# Patient Record
Sex: Male | Born: 1950
Health system: Southern US, Community
[De-identification: ages and names within clinical notes are randomized; demographics above are authoritative.]

## PROBLEM LIST (undated history)

## (undated) ENCOUNTER — Emergency Department (HOSPITAL_COMMUNITY): Admission: EM | Payer: Self-pay | Source: Home / Self Care

## (undated) DIAGNOSIS — M2550 Pain in unspecified joint: Secondary | ICD-10-CM

## (undated) DIAGNOSIS — E8881 Metabolic syndrome: Secondary | ICD-10-CM

## (undated) DIAGNOSIS — G4700 Insomnia, unspecified: Secondary | ICD-10-CM

## (undated) DIAGNOSIS — F329 Major depressive disorder, single episode, unspecified: Secondary | ICD-10-CM

## (undated) DIAGNOSIS — G8929 Other chronic pain: Secondary | ICD-10-CM

## (undated) DIAGNOSIS — F32A Depression, unspecified: Secondary | ICD-10-CM

## (undated) DIAGNOSIS — S83209A Unspecified tear of unspecified meniscus, current injury, unspecified knee, initial encounter: Secondary | ICD-10-CM

## (undated) DIAGNOSIS — E669 Obesity, unspecified: Secondary | ICD-10-CM

## (undated) DIAGNOSIS — R5383 Other fatigue: Secondary | ICD-10-CM

## (undated) DIAGNOSIS — M009 Pyogenic arthritis, unspecified: Secondary | ICD-10-CM

## (undated) DIAGNOSIS — M549 Dorsalgia, unspecified: Secondary | ICD-10-CM

## (undated) DIAGNOSIS — I10 Essential (primary) hypertension: Secondary | ICD-10-CM

## (undated) DIAGNOSIS — R42 Dizziness and giddiness: Secondary | ICD-10-CM

## (undated) DIAGNOSIS — E785 Hyperlipidemia, unspecified: Secondary | ICD-10-CM

## (undated) HISTORY — PX: HERNIA REPAIR: SHX51

## (undated) HISTORY — DX: Essential (primary) hypertension: I10

## (undated) HISTORY — DX: Major depressive disorder, single episode, unspecified: F32.9

## (undated) HISTORY — DX: Insomnia, unspecified: G47.00

## (undated) HISTORY — DX: Metabolic syndrome: E88.81

## (undated) HISTORY — DX: Hyperlipidemia, unspecified: E78.5

## (undated) HISTORY — DX: Dizziness and giddiness: R42

## (undated) HISTORY — DX: Other chronic pain: G89.29

## (undated) HISTORY — DX: Other fatigue: R53.83

## (undated) HISTORY — DX: Depression, unspecified: F32.A

## (undated) HISTORY — DX: Dorsalgia, unspecified: M54.9

## (undated) HISTORY — DX: Pain in unspecified joint: M25.50

## (undated) HISTORY — PX: INGUINAL HERNIA REPAIR: SUR1180

## (undated) HISTORY — DX: Obesity, unspecified: E66.9

## (undated) HISTORY — DX: Metabolic syndrome: E88.810

---
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
## 1990-07-20 HISTORY — PX: BACK SURGERY: SHX140

## 1991-07-21 HISTORY — PX: OTHER SURGICAL HISTORY: SHX169

## 2001-03-16 ENCOUNTER — Encounter: Admission: RE | Admit: 2001-03-16 | Discharge: 2001-03-19 | Payer: Self-pay | Admitting: Anesthesiology

## 2001-08-05 ENCOUNTER — Ambulatory Visit (HOSPITAL_COMMUNITY): Admission: RE | Admit: 2001-08-05 | Discharge: 2001-08-05 | Payer: Self-pay | Admitting: Family Medicine

## 2001-08-05 ENCOUNTER — Encounter: Payer: Self-pay | Admitting: Family Medicine

## 2001-11-28 ENCOUNTER — Encounter: Payer: Self-pay | Admitting: Family Medicine

## 2001-11-28 ENCOUNTER — Ambulatory Visit (HOSPITAL_COMMUNITY): Admission: RE | Admit: 2001-11-28 | Discharge: 2001-11-28 | Payer: Self-pay | Admitting: Family Medicine

## 2002-05-29 ENCOUNTER — Ambulatory Visit (HOSPITAL_COMMUNITY): Admission: RE | Admit: 2002-05-29 | Discharge: 2002-05-29 | Payer: Self-pay | Admitting: Internal Medicine

## 2006-07-20 HISTORY — PX: OTHER SURGICAL HISTORY: SHX169

## 2009-08-20 LAB — HM PAP SMEAR

## 2009-08-20 LAB — HM MAMMOGRAPHY

## 2010-11-20 ENCOUNTER — Encounter: Payer: Self-pay | Admitting: Family Medicine

## 2012-08-24 ENCOUNTER — Ambulatory Visit: Payer: Medicare Other | Admitting: Cardiology

## 2012-10-03 ENCOUNTER — Other Ambulatory Visit: Payer: Self-pay | Admitting: *Deleted

## 2012-10-03 DIAGNOSIS — M810 Age-related osteoporosis without current pathological fracture: Secondary | ICD-10-CM

## 2012-10-05 ENCOUNTER — Other Ambulatory Visit: Payer: Self-pay | Admitting: Nurse Practitioner

## 2012-10-18 ENCOUNTER — Other Ambulatory Visit: Payer: Self-pay

## 2012-10-18 DIAGNOSIS — Z1212 Encounter for screening for malignant neoplasm of rectum: Secondary | ICD-10-CM

## 2012-12-28 ENCOUNTER — Ambulatory Visit (INDEPENDENT_AMBULATORY_CARE_PROVIDER_SITE_OTHER): Payer: Medicare Other | Admitting: Family Medicine

## 2012-12-28 ENCOUNTER — Encounter: Payer: Self-pay | Admitting: Family Medicine

## 2012-12-28 VITALS — BP 142/87 | HR 65 | Temp 97.4°F | Ht 70.0 in | Wt 321.4 lb

## 2012-12-28 DIAGNOSIS — M62838 Other muscle spasm: Secondary | ICD-10-CM

## 2012-12-28 DIAGNOSIS — R718 Other abnormality of red blood cells: Secondary | ICD-10-CM

## 2012-12-28 DIAGNOSIS — E559 Vitamin D deficiency, unspecified: Secondary | ICD-10-CM

## 2012-12-28 DIAGNOSIS — E785 Hyperlipidemia, unspecified: Secondary | ICD-10-CM | POA: Insufficient documentation

## 2012-12-28 DIAGNOSIS — I10 Essential (primary) hypertension: Secondary | ICD-10-CM

## 2012-12-28 DIAGNOSIS — E119 Type 2 diabetes mellitus without complications: Secondary | ICD-10-CM

## 2012-12-28 DIAGNOSIS — D751 Secondary polycythemia: Secondary | ICD-10-CM

## 2012-12-28 LAB — BASIC METABOLIC PANEL WITH GFR
Calcium: 9.1 mg/dL (ref 8.4–10.5)
Chloride: 105 mEq/L (ref 96–112)
Creat: 1.1 mg/dL (ref 0.50–1.35)
GFR, Est Non African American: 71 mL/min

## 2012-12-28 LAB — POCT CBC
Granulocyte percent: 69.5 %G (ref 37–80)
HCT, POC: 46.9 % (ref 43.5–53.7)
Hemoglobin: 16.3 g/dL (ref 14.1–18.1)
MCV: 88 fL (ref 80–97)
POC Granulocyte: 4.6 (ref 2–6.9)
RBC: 5.3 M/uL (ref 4.69–6.13)
RDW, POC: 135 %

## 2012-12-28 LAB — HEPATIC FUNCTION PANEL
Albumin: 4.8 g/dL (ref 3.5–5.2)
Alkaline Phosphatase: 54 U/L (ref 39–117)
Total Bilirubin: 0.8 mg/dL (ref 0.3–1.2)

## 2012-12-28 LAB — POCT GLYCOSYLATED HEMOGLOBIN (HGB A1C): Hemoglobin A1C: 5.4

## 2012-12-28 MED ORDER — RAMIPRIL 10 MG PO CAPS: 10.0000 mg | ORAL_CAPSULE | Freq: Every day | ORAL | Status: DC

## 2012-12-28 MED ORDER — CYCLOBENZAPRINE HCL 10 MG PO TABS: ORAL_TABLET | ORAL | Status: DC

## 2012-12-28 NOTE — Patient Instructions (Addendum)
Continue current meds and therapeutic lifestyle changes Always be careful and do not put yourself at risk for falling. If left hip pain continues please come back and get x-ray of left hip

## 2012-12-28 NOTE — Progress Notes (Signed)
  Subjective:    Patient ID: Seth Martinez, male    DOB: [birth year], 62 y.o.   MRN: [id number]  HPI Patient returns to clinic today for followup of chronic medical problems. These include hypertension hyperlipidemia chronic back pain and an inhalant allergies. He has a long list of allergies to many medications. He also refuses to take the flu vaccine and the shingles shot. He is past due on his colonoscopy.    Review of Systems  Constitutional: Positive for fatigue.  HENT: Positive for sore throat (due to drainage) and postnasal drip. Negative for ear pain.   Eyes: Positive for redness and itching (due to allergies). Negative for pain.  Respiratory: Negative.   Cardiovascular: Negative.   Gastrointestinal: Negative.   Endocrine: Negative.   Genitourinary: Negative.   Musculoskeletal: Positive for back pain (LBP) and arthralgias (L hip, bilateral ankles).  Skin: Negative.   Allergic/Immunologic: Positive for environmental allergies (seasonal).  Neurological: Negative.   Hematological: Negative.   Psychiatric/Behavioral: Negative.        Objective:   Physical Exam BP 142/87  Pulse 65  Temp(Src) 97.4 F (36.3 C) (Oral)  Ht 5\' 10"  (1.778 m)  Wt 321 lb 6.4 oz (145.786 kg)  BMI 46.12 kg/m2  The patient appeared well nourished and normally developed other than being overweight. He is alert and oriented to time and place. Speech, behavior and judgement appear normal. Vital signs as documented.  Head exam is unremarkable. No scleral icterus or pallor noted. Some nasal congestion bilaterally. Ear canals were normal your Neck is without jugular venous distension, thyromegally, or carotid bruits. Carotid upstrokes are brisk bilaterally. No cervical adenopathy. Lungs are clear anteriorly and posteriorly to auscultation. Normal respiratory effort. Cardiac exam reveals regular rate and rhythm at 60 per. First and second heart sounds normal.  No murmurs, rubs or gallops.  Abdominal  exam reveals normal bowl sounds, no masses, no organomegaly and no aortic enlargement. No inguinal adenopathy. There is still severe obesity. Extremities are nonedematous and both femoral and pedal pulses are normal. The right lateral malleolus was tender to palpation. There was no rubor or erythema. Skin without pallor or jaundice.  Warm and dry, without rash. Neurologic exam reveals normal deep tendon reflexes and normal sensation. Diabetic foot exam was done.          Assessment & Plan:  1. Diabetes mellitus, type 2 - POCT glycosylated hemoglobin (Hb A1C); Standing - POCT UA - Microalbumin; Standing - BASIC METABOLIC PANEL WITH GFR; Standing - POCT glycosylated hemoglobin (Hb A1C) - POCT UA - Microalbumin - BASIC METABOLIC PANEL WITH GFR  2. Hyperlipemia - POCT CBC; Standing - NMR Lipoprofile with Lipids; Standing - Hepatic function panel; Standing - POCT CBC - NMR Lipoprofile with Lipids - Hepatic function panel  3. Hypertension - BASIC METABOLIC PANEL WITH GFR; Standing - BASIC METABOLIC PANEL WITH GFR  4. Vitamin D deficiency - Vitamin D 25 hydroxy; Standing - Vitamin D 25 hydroxy  5. Elevated red blood cell count - POCT CBC; Standing - POCT CBC  Patient Instructions  Continue current meds and therapeutic lifestyle changes Always be careful and do not put yourself at risk for falling. If left hip pain continues please come back and get x-ray of left hip

## 2012-12-29 LAB — NMR LIPOPROFILE WITH LIPIDS
HDL Particle Number: 36.3 umol/L (ref >=30.5)
HDL-C: 42 mg/dL (ref >=40)
LDL (calc): 75 mg/dL (ref <100)
LDL Size: 20.2 nm — ABNORMAL LOW (ref >20.5)
LP-IR Score: 72 — ABNORMAL HIGH (ref <=45)
Triglycerides: 146 mg/dL (ref <150)

## 2012-12-29 LAB — VITAMIN D 25 HYDROXY (VIT D DEFICIENCY, FRACTURES): Vit D, 25-Hydroxy: 46 ng/mL (ref 30–89)

## 2013-04-19 DIAGNOSIS — M009 Pyogenic arthritis, unspecified: Secondary | ICD-10-CM

## 2013-04-19 HISTORY — DX: Pyogenic arthritis, unspecified: M00.9

## 2013-04-30 ENCOUNTER — Emergency Department (HOSPITAL_COMMUNITY)
Admission: EM | Admit: 2013-04-30 | Discharge: 2013-04-30 | Disposition: A | Discharge status: Home / Self Care | Payer: Medicare Other | Attending: Emergency Medicine | Admitting: Emergency Medicine

## 2013-04-30 ENCOUNTER — Emergency Department (HOSPITAL_COMMUNITY): Payer: Medicare Other

## 2013-04-30 ENCOUNTER — Encounter (HOSPITAL_COMMUNITY): Payer: Self-pay | Admitting: Emergency Medicine

## 2013-04-30 DIAGNOSIS — M5431 Sciatica, right side: Secondary | ICD-10-CM

## 2013-04-30 DIAGNOSIS — M5430 Sciatica, unspecified side: Secondary | ICD-10-CM | POA: Insufficient documentation

## 2013-04-30 DIAGNOSIS — M79609 Pain in unspecified limb: Secondary | ICD-10-CM | POA: Insufficient documentation

## 2013-04-30 DIAGNOSIS — Z79899 Other long term (current) drug therapy: Secondary | ICD-10-CM | POA: Insufficient documentation

## 2013-04-30 DIAGNOSIS — M25559 Pain in unspecified hip: Secondary | ICD-10-CM | POA: Insufficient documentation

## 2013-04-30 DIAGNOSIS — I10 Essential (primary) hypertension: Secondary | ICD-10-CM | POA: Insufficient documentation

## 2013-04-30 DIAGNOSIS — J069 Acute upper respiratory infection, unspecified: Secondary | ICD-10-CM | POA: Insufficient documentation

## 2013-04-30 DIAGNOSIS — E669 Obesity, unspecified: Secondary | ICD-10-CM | POA: Insufficient documentation

## 2013-04-30 DIAGNOSIS — Z880 Allergy status to penicillin: Secondary | ICD-10-CM | POA: Insufficient documentation

## 2013-04-30 LAB — COMPREHENSIVE METABOLIC PANEL WITH GFR
ALT: 27 U/L (ref 0–53)
AST: 40 U/L — ABNORMAL HIGH (ref 0–37)
Albumin: 3.2 g/dL — ABNORMAL LOW (ref 3.5–5.2)
Alkaline Phosphatase: 84 U/L (ref 39–117)
BUN: 16 mg/dL (ref 6–23)
CO2: 21 meq/L (ref 19–32)
Calcium: 8.9 mg/dL (ref 8.4–10.5)
Chloride: 96 meq/L (ref 96–112)
Creatinine, Ser: 1.37 mg/dL — ABNORMAL HIGH (ref 0.50–1.35)
GFR calc Af Amer: 62 mL/min — ABNORMAL LOW
GFR calc non Af Amer: 53 mL/min — ABNORMAL LOW
Glucose, Bld: 137 mg/dL — ABNORMAL HIGH (ref 70–99)
Potassium: 4 meq/L (ref 3.5–5.1)
Sodium: 134 meq/L — ABNORMAL LOW (ref 135–145)
Total Bilirubin: 1.1 mg/dL (ref 0.3–1.2)
Total Protein: 7.3 g/dL (ref 6.0–8.3)

## 2013-04-30 LAB — CBC WITH DIFFERENTIAL/PLATELET
Eosinophils Absolute: 0 10*3/uL (ref 0.0–0.7)
Eosinophils Relative: 0 % (ref 0–5)
HCT: 42.5 % (ref 39.0–52.0)
Lymphs Abs: 0.7 10*3/uL (ref 0.7–4.0)
MCHC: 35.1 g/dL (ref 30.0–36.0)
MCV: 86.4 fL (ref 78.0–100.0)
Monocytes Relative: 11 % (ref 3–12)
Neutrophils Relative %: 85 % — ABNORMAL HIGH (ref 43–77)
RBC: 4.92 MIL/uL (ref 4.22–5.81)
WBC: 15.4 10*3/uL — ABNORMAL HIGH (ref 4.0–10.5)

## 2013-04-30 LAB — URINALYSIS, ROUTINE W REFLEX MICROSCOPIC
Glucose, UA: NEGATIVE mg/dL
Ketones, ur: 15 mg/dL — AB
Leukocytes, UA: NEGATIVE
Nitrite: NEGATIVE
Protein, ur: 30 mg/dL — AB
Specific Gravity, Urine: 1.023 (ref 1.005–1.030)
Urobilinogen, UA: 1 mg/dL (ref 0.0–1.0)
pH: 5.5 (ref 5.0–8.0)

## 2013-04-30 LAB — CG4 I-STAT (LACTIC ACID)
Lactic Acid, Venous: 2.59 mmol/L — ABNORMAL HIGH (ref 0.5–2.2)
Lactic Acid, Venous: 2.04 mmol/L (ref 0.5–2.2)

## 2013-04-30 LAB — URINE MICROSCOPIC-ADD ON

## 2013-04-30 LAB — SEDIMENTATION RATE: Sed Rate: 60 mm/h — ABNORMAL HIGH (ref 0–16)

## 2013-04-30 MED ORDER — OXYCODONE-ACETAMINOPHEN 5-325 MG PO TABS
1.0000 | ORAL_TABLET | Freq: Once | ORAL | Status: AC
  Administered 2013-04-30: 1 via ORAL
  Filled 2013-04-30: qty 1

## 2013-04-30 MED ORDER — ACETAMINOPHEN 325 MG PO TABS
650.0000 mg | ORAL_TABLET | Freq: Once | ORAL | Status: AC
  Administered 2013-04-30: 650 mg via ORAL
  Filled 2013-04-30: qty 2

## 2013-04-30 MED ORDER — FENTANYL CITRATE 0.05 MG/ML IJ SOLN
100.0000 ug | Freq: Once | INTRAMUSCULAR | Status: AC
  Administered 2013-04-30: 100 ug via INTRAVENOUS
  Filled 2013-04-30: qty 2

## 2013-04-30 MED ORDER — CYCLOBENZAPRINE HCL 10 MG PO TABS: 10.0000 mg | ORAL_TABLET | Freq: Two times a day (BID) | ORAL | Status: DC | PRN

## 2013-04-30 MED ORDER — SODIUM CHLORIDE 0.9 % IV BOLUS (SEPSIS)
1000.0000 mL | Freq: Once | INTRAVENOUS | Status: AC
  Administered 2013-04-30: 1000 mL via INTRAVENOUS

## 2013-04-30 MED ORDER — OXYCODONE-ACETAMINOPHEN 5-325 MG PO TABS: 1.0000 | ORAL_TABLET | Freq: Four times a day (QID) | ORAL | Status: DC | PRN

## 2013-04-30 MED ORDER — METHOCARBAMOL 100 MG/ML IJ SOLN
1000.0000 mg | Freq: Once | INTRAMUSCULAR | Status: AC
  Administered 2013-04-30: 1000 mg via INTRAMUSCULAR
  Filled 2013-04-30: qty 10

## 2013-04-30 NOTE — ED Notes (Signed)
MD at bedside. 

## 2013-04-30 NOTE — ED Provider Notes (Addendum)
Medical screening examination/treatment/procedure(s) were conducted as a shared visit with resident physician and myself.  I personally evaluated the patient during the encounter. I personally reviewed and interpreted the ecg and agree with the residents interpretation.    I interviewed and examined the patient. Lungs are CTAB. Cardiac exam wnl. Abdomen soft.  Positive straight leg raise in RLE. Pt has had similar pain in right hip previously. He otherwise has normal rom of RLE. I suspect his pain is d/t sciatica. His fever is likely from viral URI. I doubt that he has a septic joint at this time, but will rec close f/u w/ pcp. His mildly elev lactate has dec w/ IVF rehydration.   Junius Argyle, MD 04/30/13 1478  Junius Argyle, MD 05/12/13 1118

## 2013-04-30 NOTE — ED Notes (Signed)
Patient stated pain left knee has full range of motion however states pain developed after receiving the injection on his buttocks.  EDP at bedside.  Patient also explain pain right index finger achy soreness. Able to bend finger. Cap refill less then 3 seconds.

## 2013-04-30 NOTE — ED Provider Notes (Signed)
CSN: [id number]     Arrival date & time 04/30/13  1339 History   First MD Initiated Contact with Patient 04/30/13 1454     Chief Complaint  Patient presents with  . Back Pain  . Cough   (Consider location/radiation/quality/duration/timing/severity/associated sxs/prior Treatment) Patient is a 62 y.o. male presenting with leg pain.  Leg Pain Location:  Hip and leg Injury: no   Hip location:  R hip Leg location:  R leg and R upper leg Pain details:    Quality:  Burning   Radiates to:  Groin   Severity:  Severe   Onset quality:  Gradual   Timing:  Constant   Progression:  Worsening Chronicity:  Recurrent Dislocation: no   Foreign body present:  No foreign bodies Prior injury to area:  No Relieved by:  Nothing Worsened by:  Bearing weight, activity and flexion Ineffective treatments: flexeril. Associated symptoms: fever, swelling and tingling   Associated symptoms: no back pain, no decreased ROM, no itching, no muscle weakness, no neck pain, no numbness and no stiffness     Past Medical History  Diagnosis Date  . Obesity   . Hypertension    Past Surgical History  Procedure Laterality Date  . Back surgery    . Hernia repair     History reviewed. No pertinent family history. History  Substance Use Topics  . Smoking status: Never Smoker   . Smokeless tobacco: Not on file  . Alcohol Use: No    Review of Systems  Constitutional: Positive for fever. Negative for activity change and appetite change.  Respiratory: Negative for apnea, cough, choking and shortness of breath.   Cardiovascular: Negative for chest pain.  Gastrointestinal: Negative for nausea, vomiting, diarrhea and constipation.  Genitourinary: Negative for dysuria and difficulty urinating.  Musculoskeletal: Positive for arthralgias and gait problem. Negative for back pain, joint swelling, neck pain, neck stiffness and stiffness.  Skin: Negative for color change, itching, pallor, rash and wound.   Neurological: Negative for weakness, light-headedness and numbness.  All other systems reviewed and are negative.    Allergies  Amitriptyline; Bextra; Cefdinir; Celebrex; Ibuprofen; Lexapro; Morphine and related; Neurontin; Penicillins; Propoxyphene and methadone; Skelaxin; Ultram; and Vioxx  Home Medications   Current Outpatient Rx  Name  Route  Sig  Dispense  Refill  . cyclobenzaprine (FLEXERIL) 10 MG tablet   Oral   Take 10 mg by mouth daily as needed for muscle spasms.         Marland Kitchen dextromethorphan (DELSYM) 30 MG/5ML liquid   Oral   Take 60 mg by mouth every 12 (twelve) hours as needed for cough.         . Pseudoeph-Doxylamine-DM-APAP (NYQUIL PO)   Oral   Take 30 mLs by mouth at bedtime as needed (cold symptoms).         . Pseudoephedrine-APAP-DM (DAYQUIL PO)   Oral   Take 30 mLs by mouth every 6 (six) hours as needed (cold symptoms).         . cyclobenzaprine (FLEXERIL) 10 MG tablet   Oral   Take 1 tablet (10 mg total) by mouth 2 (two) times daily as needed for muscle spasms.   20 tablet   0   . oxyCODONE-acetaminophen (PERCOCET/ROXICET) 5-325 MG per tablet   Oral   Take 1 tablet by mouth every 6 (six) hours as needed for pain.   20 tablet   0    BP 127/84  Pulse 82  Temp(Src) 99.5 F (37.5 C) (Oral)  Resp 20  SpO2 97% Physical Exam  Nursing note and vitals reviewed. Constitutional: He is oriented to person, place, and time. He appears well-developed and well-nourished. No distress.  Obese, uncomfortable, moving around in bed to try and get comfortable  HENT:  Head: Normocephalic and atraumatic.  Mouth/Throat: Oropharynx is clear and moist. No oropharyngeal exudate.  Eyes: Conjunctivae and EOM are normal. Pupils are equal, round, and reactive to light.  Neck: Normal range of motion. Neck supple.  Cardiovascular: Normal rate, regular rhythm, normal heart sounds and intact distal pulses.  Exam reveals no gallop and no friction rub.   No murmur  heard. Pulmonary/Chest: Effort normal and breath sounds normal. No respiratory distress. He has no wheezes. He has no rales.  Abdominal: Soft. He exhibits no distension and no mass. There is no tenderness. There is no rebound and no guarding.  obese  Musculoskeletal: Normal range of motion. He exhibits tenderness (tenderness to anterior and posterior hip, worsening upper posterior thigh pain with ROM but full ROM. no warmth, erythema,.). He exhibits no edema.  Lymphadenopathy:    He has no cervical adenopathy.  Neurological: He is alert and oriented to person, place, and time. He has normal strength. No cranial nerve deficit or sensory deficit. GCS eye subscore is 4. GCS verbal subscore is 5. GCS motor subscore is 6.  Skin: Skin is warm and dry. No rash noted. He is not diaphoretic.  Psychiatric: He has a normal mood and affect. His behavior is normal. Judgment and thought content normal.    ED Course  Procedures (including critical care time) Labs Review Labs Reviewed  CBC WITH DIFFERENTIAL - Abnormal; Notable for the following:    WBC 15.4 (*)    Neutrophils Relative % 85 (*)    Neutro Abs 13.0 (*)    Lymphocytes Relative 4 (*)    Monocytes Absolute 1.6 (*)    All other components within normal limits  COMPREHENSIVE METABOLIC PANEL - Abnormal; Notable for the following:    Sodium 134 (*)    Glucose, Bld 137 (*)    Creatinine, Ser 1.37 (*)    Albumin 3.2 (*)    AST 40 (*)    GFR calc non Af Amer 53 (*)    GFR calc Af Amer 62 (*)    All other components within normal limits  URINALYSIS, ROUTINE W REFLEX MICROSCOPIC - Abnormal; Notable for the following:    Color, Urine AMBER (*)    APPearance CLOUDY (*)    Hgb urine dipstick TRACE (*)    Bilirubin Urine SMALL (*)    Ketones, ur 15 (*)    Protein, ur 30 (*)    All other components within normal limits  SEDIMENTATION RATE - Abnormal; Notable for the following:    Sed Rate 60 (*)    All other components within normal limits   URINE MICROSCOPIC-ADD ON - Abnormal; Notable for the following:    Casts HYALINE CASTS (*)    All other components within normal limits  CG4 I-STAT (LACTIC ACID) - Abnormal; Notable for the following:    Lactic Acid, Venous 2.59 (*)    All other components within normal limits  C-REACTIVE PROTEIN  CG4 I-STAT (LACTIC ACID)   Imaging Review Dg Chest 2 View  04/30/2013   CLINICAL DATA:  Back pain and cough.  EXAM: CHEST  2 VIEW  COMPARISON:  No priors.  FINDINGS: Lung volumes are normal. No consolidative airspace disease. No pleural effusions. No pneumothorax. No pulmonary nodule  or mass noted. Pulmonary vasculature and the cardiomediastinal silhouette are within normal limits. Old healed fracture of the distal right clavicle incidentally noted.  IMPRESSION: 1.  No radiographic evidence of acute cardiopulmonary disease.   Electronically Signed   By: Trudie Reed M.D.   On: 04/30/2013 15:22   Dg Lumbar Spine 2-3 Views  04/30/2013   CLINICAL DATA:  Back pain.  EXAM: LUMBAR SPINE - 2-3 VIEW  COMPARISON:  None.  FINDINGS: There is marked convex right scoliosis. Multilevel degenerative change is present. The patient is status post L4-S1 fusion. Hardware appears intact. No fracture is identified.  IMPRESSION: No acute finding.  Multilevel degenerative change. Status post L4-S1 fusion.   Electronically Signed   By: Drusilla Kanner M.D.   On: 04/30/2013 16:56   Dg Hip Complete Right  04/30/2013   CLINICAL DATA:  Low back pain and right-sided hip pain.  EXAM: RIGHT HIP - COMPLETE 2+ VIEW  COMPARISON:  No priors.  FINDINGS: AP view of the pelvis and AP and lateral views of the right hip demonstrate no definite acute displaced fracture, subluxation, dislocation, joint or soft tissue abnormality. Orthopedic fixation hardware is noted at the lumbosacral junction, and the fixation screw in the low was position on the left appears fractured. No prior studies are available for comparison.  IMPRESSION: 1. No  acute radiographic abnormality of the bony pelvis or the left hip. 2. Fracture of the inferior fixation screw on the left side in this patient status post PLIF at L4-S1. Whether or not this is an acute finding or has been present on prior outside studies is uncertain. Correlation with prior outside examinations is suggested.   Electronically Signed   By: Trudie Reed M.D.   On: 04/30/2013 16:50    EKG Interpretation   None       Date: 04/30/2013  Rate: 85  Rhythm: normal sinus rhythm  QRS Axis: normal  Intervals: normal  ST/T Wave abnormalities: normal  Conduction Disutrbances:none  Narrative Interpretation:   Old EKG Reviewed: none available   MDM   1. Sciatica, right   2. Viral URI with cough     The patient is a 62 year old male with a history of lumbar back pain status post laminectomy and fusion approximately 20 years oh as well as previous history of hernia surgery who presents with one week of cough, viral URI symptoms as well as 3 days of fever and right leg, hip pain. Patient states that symptoms were improving and sputum is nonproductive, however since Thursday has had increased pain in his right posterior hip as well as right thigh. Described as burning and radiating towards his knee. Patient has had previous similar symptoms that have responded to Flexeril, however his symptoms have not. Described as similar to previous, however worse. No associated chest pain, dyspnea, abdominal pain, nausea, vomiting, constipation, diarrhea, dysuria.  URI symptoms appear to be viral in nature with cold, congestion, nonproductive sputum, mild dyspnea. Symptoms are resolving. Patient's main concern is right hip pain. No history of trauma and doubt disruption to lumbar back hardware, however will evaluate with plain film of the spine. Based on fever as well as point tenderness of the right hip, osteomyelitis or septic joint is possible. Patient shows no signs of DVT or embolic event. No  abdominal pain and no signs of aortic dissection. In addition to labs, inflammatory markers, plain films, will treat with fluid hydration, pain control, muscle relaxants. Differential also includes MSK pain.  Chest x-ray without  consolidation, effusion, pneumothorax, widened mediastinum. Lactate returned at 2.6 and leukocytosis of 15.4 noted with neutrophilia. Additionally, slight renal impairment with GFR of 53. ESR and CRP pending.  Repeat lactic acid normal. There are signs of urinary tract infection. Premature markers mildly elevated but based on physical exam as well as history, feel that septic joint is clear and likely in this patient. No joint effusion on plain film. Hardware malfunction noted on lumbar spine is not new per patient and he has had no trauma. No signs of osteomyelitis on plain film. Exam remains benign with positive straight leg test on the right and pain consistent more with musculoskeletal/sciatica in nature. Pain significantly improved following IV narcotics as well as transition to Percocet by mouth. Feel that fever and mild elevation in inflammatory markers is likely secondary to URI. Chest x-ray without signs of pneumonia. Discussed extensively with patient the findings and the important need for followup with PCP this week. Patient voiced understanding. Extensively discussed need to return with increasing fever, nausea, vomiting, lower back pain, pain with range of motion of the right hip, erythema, swelling. He again voiced understanding.  Patient stable for discharge with PCP followup. Patient was discussed with my attending, Dr. Romeo Apple.    Dorna Leitz, MD 04/30/13 2055

## 2013-04-30 NOTE — ED Notes (Signed)
Pt reports having a cold x 1 week, having productive cough. Now also having right side lower back pain that is radiating to right groin.

## 2013-05-01 ENCOUNTER — Encounter: Payer: Self-pay | Admitting: Family Medicine

## 2013-05-01 ENCOUNTER — Encounter (HOSPITAL_COMMUNITY): Payer: Self-pay | Admitting: Emergency Medicine

## 2013-05-01 ENCOUNTER — Emergency Department (HOSPITAL_COMMUNITY): Payer: Medicare Other

## 2013-05-01 ENCOUNTER — Inpatient Hospital Stay (HOSPITAL_COMMUNITY)
Admission: EM | Admit: 2013-05-01 | Discharge: 2013-05-10 | DRG: 854 | Disposition: A | Discharge status: Home Health | Payer: Medicare Other | Attending: Internal Medicine | Admitting: Internal Medicine

## 2013-05-01 DIAGNOSIS — Z6841 Body Mass Index (BMI) 40.0 and over, adult: Secondary | ICD-10-CM

## 2013-05-01 DIAGNOSIS — E119 Type 2 diabetes mellitus without complications: Secondary | ICD-10-CM

## 2013-05-01 DIAGNOSIS — H3090 Unspecified chorioretinal inflammation, unspecified eye: Secondary | ICD-10-CM | POA: Diagnosis present

## 2013-05-01 DIAGNOSIS — R7881 Bacteremia: Secondary | ICD-10-CM

## 2013-05-01 DIAGNOSIS — A4101 Sepsis due to Methicillin susceptible Staphylococcus aureus: Secondary | ICD-10-CM | POA: Diagnosis present

## 2013-05-01 DIAGNOSIS — M1990 Unspecified osteoarthritis, unspecified site: Secondary | ICD-10-CM

## 2013-05-01 DIAGNOSIS — M6580 Other synovitis and tenosynovitis, unspecified site: Secondary | ICD-10-CM | POA: Diagnosis present

## 2013-05-01 DIAGNOSIS — I10 Essential (primary) hypertension: Secondary | ICD-10-CM

## 2013-05-01 DIAGNOSIS — A4102 Sepsis due to Methicillin resistant Staphylococcus aureus: Principal | ICD-10-CM | POA: Diagnosis present

## 2013-05-01 DIAGNOSIS — H3092 Unspecified chorioretinal inflammation, left eye: Secondary | ICD-10-CM

## 2013-05-01 DIAGNOSIS — H338 Other retinal detachments: Secondary | ICD-10-CM | POA: Diagnosis present

## 2013-05-01 DIAGNOSIS — I5032 Chronic diastolic (congestive) heart failure: Secondary | ICD-10-CM | POA: Diagnosis present

## 2013-05-01 DIAGNOSIS — M009 Pyogenic arthritis, unspecified: Secondary | ICD-10-CM

## 2013-05-01 DIAGNOSIS — A4902 Methicillin resistant Staphylococcus aureus infection, unspecified site: Secondary | ICD-10-CM

## 2013-05-01 DIAGNOSIS — E785 Hyperlipidemia, unspecified: Secondary | ICD-10-CM

## 2013-05-01 DIAGNOSIS — Z981 Arthrodesis status: Secondary | ICD-10-CM

## 2013-05-01 DIAGNOSIS — R509 Fever, unspecified: Secondary | ICD-10-CM

## 2013-05-01 DIAGNOSIS — E559 Vitamin D deficiency, unspecified: Secondary | ICD-10-CM

## 2013-05-01 LAB — SEDIMENTATION RATE: Sed Rate: 67 mm/hr — ABNORMAL HIGH (ref 0–16)

## 2013-05-01 LAB — CBC WITH DIFFERENTIAL/PLATELET
Eosinophils Relative: 0 % (ref 0–5)
HCT: 38.7 % — ABNORMAL LOW (ref 39.0–52.0)
Hemoglobin: 13.4 g/dL (ref 13.0–17.0)
Lymphocytes Relative: 5 % — ABNORMAL LOW (ref 12–46)
Lymphs Abs: 0.9 10*3/uL (ref 0.7–4.0)
MCH: 29.8 pg (ref 26.0–34.0)
MCV: 86 fL (ref 78.0–100.0)
Monocytes Absolute: 2 10*3/uL — ABNORMAL HIGH (ref 0.1–1.0)
Monocytes Relative: 12 % (ref 3–12)
Neutro Abs: 13.7 10*3/uL — ABNORMAL HIGH (ref 1.7–7.7)
RDW: 13.8 % (ref 11.5–15.5)
WBC: 16.6 10*3/uL — ABNORMAL HIGH (ref 4.0–10.5)

## 2013-05-01 LAB — GRAM STAIN: Special Requests: NORMAL

## 2013-05-01 MED ORDER — SODIUM CHLORIDE 0.9 % IV BOLUS (SEPSIS)
1000.0000 mL | Freq: Once | INTRAVENOUS | Status: AC
  Administered 2013-05-01: 1000 mL via INTRAVENOUS

## 2013-05-01 MED ORDER — FENTANYL CITRATE 0.05 MG/ML IJ SOLN
100.0000 ug | Freq: Once | INTRAMUSCULAR | Status: AC
  Administered 2013-05-01: 100 ug via INTRAVENOUS
  Filled 2013-05-01: qty 2

## 2013-05-01 MED ORDER — ALBUTEROL SULFATE (5 MG/ML) 0.5% IN NEBU
INHALATION_SOLUTION | RESPIRATORY_TRACT | Status: AC
  Administered 2013-05-01: 5 mg via RESPIRATORY_TRACT
  Filled 2013-05-01: qty 1

## 2013-05-01 MED ORDER — VANCOMYCIN HCL 10 G IV SOLR
2500.0000 mg | Freq: Once | INTRAVENOUS | Status: AC
  Administered 2013-05-02: 2500 mg via INTRAVENOUS
  Filled 2013-05-01: qty 2500

## 2013-05-01 MED ORDER — ALBUTEROL SULFATE (5 MG/ML) 0.5% IN NEBU
5.0000 mg | INHALATION_SOLUTION | Freq: Once | RESPIRATORY_TRACT | Status: AC
  Administered 2013-05-01: 5 mg via RESPIRATORY_TRACT

## 2013-05-01 MED ORDER — ACETAMINOPHEN 500 MG PO TABS
1000.0000 mg | ORAL_TABLET | Freq: Once | ORAL | Status: AC
  Administered 2013-05-01: 1000 mg via ORAL
  Filled 2013-05-01: qty 2

## 2013-05-01 NOTE — ED Notes (Signed)
Neb in progress pt alert & interactive, to xray.

## 2013-05-01 NOTE — ED Notes (Signed)
Pt from b/r to stretcher via w/c and NT, alert, NAD, calm, interactive, cooperative, assisting with transfers. Family x2 at [facility]. Suture cart at [facility]. Dr. Durward Fortes aware.  Pt c/o continued fever & pain, c/w last night, also worsening pain and new areas of pain.

## 2013-05-01 NOTE — ED Notes (Addendum)
EDP resident Dr. Durward Fortes at [facility] for needle aspiration of L knee. IV started, blood drawn, IVF infusing, (see physical diagram in EPIC chart).

## 2013-05-01 NOTE — ED Notes (Signed)
Pt reports having a productive cough last week, pt reports he developed pain to his Right shoulder starting yesterday, pt also reports pain to bilateral lower extremities with increase pain to his Left knee, pt reports he has been using his upper body to assist getting himself up out of the bed and chair at home, pt also reports he developed pain and swelling to his Right pointer finger/hand after he was seen in our ED last night. Pt also reports having a fever yesterday and having a fever tonight @ 1830 of 103.1, pt took Tylenol 500 mg @ 1830, 1 Percocet at 1800, and fever had decreased to 101.5 @ 1930. Pt seen here last night for the same symptoms. Pt reports he began experiencing burry vision upon waking this am. No neuro deficits noted, no facial droop, no arm drift, grips and strengths equal bilaterally.

## 2013-05-01 NOTE — ED Notes (Signed)
Lab at [facility], Dr. Durward Fortes at [facility].

## 2013-05-01 NOTE — ED Provider Notes (Signed)
CSN: [id number]     Arrival date & time 05/01/13  2101 History   First MD Initiated Contact with Patient 05/01/13 2121     Chief Complaint  Patient presents with  . Joint Pain   (Consider location/radiation/quality/duration/timing/severity/associated sxs/prior Treatment) Patient is a 62 y.o. male presenting with knee pain. The history is provided by the patient.  Knee Pain Location:  Knee Time since incident:  1 day Injury: no   Knee location:  L knee Pain details:    Quality:  Aching   Severity:  Severe   Onset quality:  Gradual   Duration:  1 day   Timing:  Constant   Progression:  Worsening Chronicity:  New Dislocation: no   Foreign body present:  No foreign bodies Tetanus status:  Up to date Prior injury to area:  No Relieved by: percocet. Worsened by:  Bearing weight Associated symptoms: back pain, fever, stiffness and swelling   Associated symptoms: no muscle weakness, no neck pain and no numbness   Risk factors: obesity     Past Medical History  Diagnosis Date  . Hyperlipidemia   . Chronic back pain   . NIDDM (non-insulin dependent diabetes mellitus)   . Metabolic syndrome   . Depression   . Insomnia   . Fatigue   . Vertigo   . Joint pain   . Obesity   . Hypertension    Past Surgical History  Procedure Laterality Date  . Rt knee arthroscopic  07/2006  . Fusion lt sacrum with screws  1993  . Fix screws in sacrum  in office  1993  . Back surgery    . Hernia repair     History reviewed. No pertinent family history. History  Substance Use Topics  . Smoking status: Never Smoker   . Smokeless tobacco: Not on file  . Alcohol Use: No     Comment: rare    Review of Systems  Constitutional: Positive for fever and chills. Negative for activity change.  Respiratory: Positive for cough. Negative for chest tightness, shortness of breath and wheezing.   Cardiovascular: Negative for chest pain.  Gastrointestinal: Negative for nausea, vomiting, diarrhea and  constipation.  Genitourinary: Negative for dysuria and difficulty urinating.  Musculoskeletal: Positive for arthralgias, back pain, gait problem, joint swelling, myalgias and stiffness. Negative for neck pain.  Skin: Positive for color change. Negative for pallor, rash and wound.  Neurological: Negative for speech difficulty, weakness, light-headedness and numbness.  All other systems reviewed and are negative.    Allergies  Morphine and related; Penicillins; Amitriptyline; Antara; Bextra; Cefdinir; Celebrex; Crestor; Escitalopram oxalate; Fish oil; Ibuprofen; Naproxen sodium; Neurontin; Propoxyphene and methadone; Skelaxin; Ultram; Vioxx; and Zocor  Home Medications   Current Outpatient Rx  Name  Route  Sig  Dispense  Refill  . acetaminophen (TYLENOL) 500 MG tablet   Oral   Take 500 mg by mouth every 6 (six) hours as needed for pain.         . cyclobenzaprine (FLEXERIL) 10 MG tablet   Oral   Take 10 mg by mouth daily as needed for muscle spasms.         Marland Kitchen dextromethorphan (DELSYM) 30 MG/5ML liquid   Oral   Take 60 mg by mouth every 12 (twelve) hours as needed for cough.         Marland Kitchen ibuprofen (ADVIL,MOTRIN) 200 MG tablet   Oral   Take 200 mg by mouth every 6 (six) hours as needed for pain.         Marland Kitchen  oxyCODONE-acetaminophen (PERCOCET/ROXICET) 5-325 MG per tablet   Oral   Take 1 tablet by mouth every 6 (six) hours as needed for pain.   20 tablet   0   . Pseudoeph-Doxylamine-DM-APAP (NYQUIL PO)   Oral   Take 30 mLs by mouth at bedtime as needed (cold symptoms).         . Pseudoephedrine-APAP-DM (DAYQUIL PO)   Oral   Take 30 mLs by mouth every 6 (six) hours as needed (cold symptoms).          BP 129/59  Pulse 93  Temp(Src) 97.9 F (36.6 C) (Oral)  Resp 18  Ht 5\' 9"  (1.753 m)  Wt 321 lb (145.605 kg)  BMI 47.38 kg/m2  SpO2 95% Physical Exam  Nursing note and vitals reviewed. Constitutional: He is oriented to person, place, and time. He appears  well-developed and well-nourished. He appears distressed.  Obese male in obvious discomfort.  HENT:  Head: Normocephalic and atraumatic.  Mouth/Throat: Oropharynx is clear and moist. No oropharyngeal exudate.  Eyes: Conjunctivae and EOM are normal. Pupils are equal, round, and reactive to light.  Neck: Normal range of motion. Neck supple.  Cardiovascular: Normal rate, regular rhythm, normal heart sounds and intact distal pulses.  Exam reveals no gallop and no friction rub.   No murmur heard. Pulmonary/Chest: Effort normal and breath sounds normal. No respiratory distress. He has no wheezes. He has no rales.  Abdominal: Soft. He exhibits no distension and no mass. There is no tenderness. There is no rebound and no guarding.  Obese  Musculoskeletal: Normal range of motion. He exhibits no edema and no tenderness.  Left knee with joint effusion, warm, erythematous. Full range of motion without significant pain. Right index finger with fusiform swelling, warmth, pain with range of motion. Right hip with full range of motion, no warmth, erythema.  Lymphadenopathy:    He has no cervical adenopathy.  Neurological: He is alert and oriented to person, place, and time. No cranial nerve deficit.  Skin: Skin is warm and dry. No rash noted. He is not diaphoretic.  Psychiatric: He has a normal mood and affect. His behavior is normal. Judgment and thought content normal.    ED Course  ARTHOCENTESIS Date/Time: 05/02/2013 12:35 AM Performed by: Dorna Leitz Authorized by: Dorna Leitz Consent: written consent obtained. Risks and benefits: risks, benefits and alternatives were discussed Consent given by: patient Patient understanding: patient states understanding of the procedure being performed Patient consent: the patient's understanding of the procedure matches consent given Procedure consent: procedure consent matches procedure scheduled Relevant documents: relevant documents present and  verified Test results: test results available and properly labeled Site marked: the operative site was marked Required items: required blood products, implants, devices, and special equipment available Patient identity confirmed: verbally with patient Indications: joint swelling, pain, possible septic joint and diagnostic evaluation  Body area: knee Joint: left knee Local anesthesia used: yes Anesthesia: local infiltration Local anesthetic: lidocaine 2% without epinephrine Anesthetic total: 4 ml Patient sedated: no Preparation: Patient was prepped and draped in the usual sterile fashion. Needle gauge: 18 G Approach: medial Aspirate: blood-tinged Aspirate amount: 10 ml Patient tolerance: Patient tolerated the procedure well with no immediate complications.   (including critical care time) Labs Review Labs Reviewed  SEDIMENTATION RATE - Abnormal; Notable for the following:    Sed Rate 67 (*)    All other components within normal limits  CBC WITH DIFFERENTIAL - Abnormal; Notable for the following:    WBC 16.6 (*)  HCT 38.7 (*)    Neutrophils Relative % 82 (*)    Neutro Abs 13.7 (*)    Lymphocytes Relative 5 (*)    Monocytes Absolute 2.0 (*)    All other components within normal limits  SYNOVIAL CELL COUNT + DIFF, W/ CRYSTALS - Abnormal; Notable for the following:    Color, Synovial RED (*)    Appearance-Synovial CLOUDY (*)    WBC, Synovial 16109 (*)    Neutrophil, Synovial 79 (*)    Monocyte-Macrophage-Synovial Fluid 21 (*)    All other components within normal limits  GRAM STAIN  CULTURE, BLOOD (ROUTINE X 2)  CULTURE, BLOOD (ROUTINE X 2)  BODY FLUID CULTURE  C-REACTIVE PROTEIN  BASIC METABOLIC PANEL  URIC ACID   Imaging Review Dg Chest 2 View  04/30/2013   CLINICAL DATA:  Back pain and cough.  EXAM: CHEST  2 VIEW  COMPARISON:  No priors.  FINDINGS: Lung volumes are normal. No consolidative airspace disease. No pleural effusions. No pneumothorax. No pulmonary nodule  or mass noted. Pulmonary vasculature and the cardiomediastinal silhouette are within normal limits. Old healed fracture of the distal right clavicle incidentally noted.  IMPRESSION: 1.  No radiographic evidence of acute cardiopulmonary disease.   Electronically Signed   By: Trudie Reed M.D.   On: 04/30/2013 15:22   Dg Lumbar Spine 2-3 Views  04/30/2013   CLINICAL DATA:  Back pain.  EXAM: LUMBAR SPINE - 2-3 VIEW  COMPARISON:  None.  FINDINGS: There is marked convex right scoliosis. Multilevel degenerative change is present. The patient is status post L4-S1 fusion. Hardware appears intact. No fracture is identified.  IMPRESSION: No acute finding.  Multilevel degenerative change. Status post L4-S1 fusion.   Electronically Signed   By: Drusilla Kanner M.D.   On: 04/30/2013 16:56   Dg Hip Complete Right  04/30/2013   CLINICAL DATA:  Low back pain and right-sided hip pain.  EXAM: RIGHT HIP - COMPLETE 2+ VIEW  COMPARISON:  No priors.  FINDINGS: AP view of the pelvis and AP and lateral views of the right hip demonstrate no definite acute displaced fracture, subluxation, dislocation, joint or soft tissue abnormality. Orthopedic fixation hardware is noted at the lumbosacral junction, and the fixation screw in the low was position on the left appears fractured. No prior studies are available for comparison.  IMPRESSION: 1. No acute radiographic abnormality of the bony pelvis or the left hip. 2. Fracture of the inferior fixation screw on the left side in this patient status post PLIF at L4-S1. Whether or not this is an acute finding or has been present on prior outside studies is uncertain. Correlation with prior outside examinations is suggested.   Electronically Signed   By: Trudie Reed M.D.   On: 04/30/2013 16:50   Dg Knee 2 Views Left  05/02/2013   CLINICAL DATA:  Joint pain.  EXAM: LEFT KNEE - 1-2 VIEW  COMPARISON:  None.  FINDINGS: There is subcutaneous emphysema in a line along the medial knee,  presumably from reported arthrocentesis. Small to moderate knee joint effusion, suprapatellar. No osteolysis. Mild tricompartmental osteoarthritis, without focal or advanced joint narrowing. No fracture, malalignment, or other focal osseous abnormality.  IMPRESSION: 1. Mild to moderate volume knee joint effusion. 2. Mild tricompartmental osteoarthritis. 3. Subcutaneous gas, presumably from reported arthrocentesis.   Electronically Signed   By: Tiburcio Pea M.D.   On: 05/02/2013 00:08   Dg Hand 2 View Right  05/02/2013   CLINICAL DATA:  Pain, no  injury  EXAM: RIGHT HAND - 2 VIEW  COMPARISON:  None.  FINDINGS: There is no evidence of fracture or dislocation. There is no evidence of arthropathy or other focal bone abnormality. Mild soft tissue swelling is present  IMPRESSION: No acute osseous abnormality.   Electronically Signed   By: Davonna Belling M.D.   On: 05/02/2013 00:09    EKG Interpretation   None       MDM   1. Arthritis   2. Fever     62 year old male with a history of hypertension, hyperlipidemia, type 2 diabetes who presents with left knee swelling, pain, warmth as well as right index finger warts. Patient was seen in this institution yesterday by myself for right hip pain as well as fever and URI symptoms. Patient does have a history of lumbar back surgery with residual sciatica. He has had no recent trauma. Right hip remains benign with full range of motion, however since yesterday his left knee has swollen up and become increasingly painful. The patient does not have a history of gout, however multiple multiple family members have gout and this is similar to the symptoms. Fever to 103 today, for which he has taken acetaminophen.  On exam patient has warm, left knee effusion. Right index finger with warmth, fusiform swelling. Right hip remains benign with full range of motion, no point tenderness. Complaining of lumbar back pain as well. Differential includes bacterial, viral,  rheumatoid, crystal-induced. Based on polyarticular nature, septic joint would be lower on the differential. Knee arthrocentesis was performed and synovial fluid was analyzed. Will get plain films of the left knee, right hand. We'll also repeat labs, blood cultures. Fluids, pain control treated.  Leukocytosis 16. Elevated inflammatory markers. Synovial fluid shows 40,000 white cells without crystals. No organisms but many white blood cells. Concern for inflammatory versus infectious arthritis. Vancomycin given empirically for septic arthritis. Consult for admission based on fever, symptoms, followup cultures. Admitted to the floor hemodynamically stable.  Patient was discussed with my attending, Dr. Ethelda Chick.   Dorna Leitz, MD 05/02/13 [phone]

## 2013-05-01 NOTE — ED Notes (Signed)
Dr. Ethelda Chick at [facility], pt alert, NAD, calm, interactive, family x2 at [facility].

## 2013-05-01 NOTE — ED Notes (Signed)
Pt not in room, pt in b/r with family, taken to b/r by TA, NT via w/c.

## 2013-05-01 NOTE — ED Notes (Signed)
Back from xray, neb complete/finished, on RA.

## 2013-05-01 NOTE — ED Provider Notes (Signed)
Complains of pain in right shoulder right index finger and left knee. Seen here yesterday. Treated with Percocet. Feels worse today. On exam patient is alert mildly ill-appearing right upper extremity with redness swelling and tenderness over MCP joint, dorsal aspect shoulder is without redness swelling or tenderness. Neurovascular intact. Left lower extremity tender and swollen and red over anterior knee. Neurovascularly Left upper extremity and right lower cervical redness swelling or tenderness, neurovascularly intact. Medical decision making: Concern for septic arthritis given the fever and two swollen red hot joints  Doug Sou, MD 05/02/13 0003

## 2013-05-01 NOTE — ED Notes (Signed)
Patient requested and received an extra pillow to support his back and a cup of ice water.

## 2013-05-01 NOTE — ED Notes (Addendum)
Neb in progress for wheezing, pt to xray, family at [facility], no changes, pt alert, NAD, calm, intereactive. Tylenol given for fever and pain, fentanyl given for pain. IVF bolus infusing.

## 2013-05-02 DIAGNOSIS — M129 Arthropathy, unspecified: Secondary | ICD-10-CM

## 2013-05-02 DIAGNOSIS — M009 Pyogenic arthritis, unspecified: Secondary | ICD-10-CM

## 2013-05-02 LAB — CBC
HCT: 37.6 % — ABNORMAL LOW (ref 39.0–52.0)
Hemoglobin: 13.2 g/dL (ref 13.0–17.0)
MCH: 30.4 pg (ref 26.0–34.0)
MCHC: 35.1 g/dL (ref 30.0–36.0)
MCV: 86.6 fL (ref 78.0–100.0)
Platelets: 176 K/uL (ref 150–400)
RBC: 4.34 MIL/uL (ref 4.22–5.81)
RDW: 13.8 % (ref 11.5–15.5)
WBC: 14.2 K/uL — ABNORMAL HIGH (ref 4.0–10.5)

## 2013-05-02 LAB — URIC ACID: Uric Acid, Serum: 5.4 mg/dL (ref 4.0–7.8)

## 2013-05-02 LAB — BASIC METABOLIC PANEL
BUN: 18 mg/dL (ref 6–23)
Calcium: 7.8 mg/dL — ABNORMAL LOW (ref 8.4–10.5)
Chloride: 99 mEq/L (ref 96–112)
GFR calc Af Amer: 73 mL/min — ABNORMAL LOW (ref >90)
GFR calc non Af Amer: 63 mL/min — ABNORMAL LOW (ref >90)
Glucose, Bld: 122 mg/dL — ABNORMAL HIGH (ref 70–99)
Sodium: 135 mEq/L (ref 135–145)

## 2013-05-02 LAB — SYNOVIAL CELL COUNT + DIFF, W/ CRYSTALS
Crystals, Fluid: NONE SEEN
Eosinophils-Synovial: 0 % (ref 0–1)
Lymphocytes-Synovial Fld: 0 % (ref 0–20)
Monocyte-Macrophage-Synovial Fluid: 21 % — ABNORMAL LOW (ref 50–90)
Neutrophil, Synovial: 79 % — ABNORMAL HIGH (ref 0–25)
WBC, Synovial: 42070 /mm3 — ABNORMAL HIGH (ref 0–200)

## 2013-05-02 LAB — C-REACTIVE PROTEIN: CRP: 36.8 mg/dL — ABNORMAL HIGH (ref <0.60)

## 2013-05-02 LAB — GLUCOSE, CAPILLARY

## 2013-05-02 MED ORDER — INFLUENZA VAC SPLIT QUAD 0.5 ML IM SUSP
0.5000 mL | INTRAMUSCULAR | Status: AC
  Filled 2013-05-02: qty 0.5

## 2013-05-02 MED ORDER — POLYETHYLENE GLYCOL 3350 17 G PO PACK
17.0000 g | PACK | Freq: Two times a day (BID) | ORAL | Status: DC
  Administered 2013-05-02 – 2013-05-09 (×8): 17 g via ORAL
  Filled 2013-05-02 (×16): qty 1

## 2013-05-02 MED ORDER — VANCOMYCIN HCL 10 G IV SOLR
1500.0000 mg | Freq: Two times a day (BID) | INTRAVENOUS | Status: DC
  Administered 2013-05-02 – 2013-05-03 (×4): 1500 mg via INTRAVENOUS
  Filled 2013-05-02 (×6): qty 1500

## 2013-05-02 MED ORDER — SODIUM CHLORIDE 0.9 % IV SOLN: INTRAVENOUS | Status: AC

## 2013-05-02 MED ORDER — SENNA 8.6 MG PO TABS
2.0000 | ORAL_TABLET | Freq: Two times a day (BID) | ORAL | Status: DC
  Administered 2013-05-02 – 2013-05-09 (×9): 17.2 mg via ORAL
  Filled 2013-05-02 (×18): qty 2

## 2013-05-02 MED ORDER — CYCLOBENZAPRINE HCL 10 MG PO TABS
10.0000 mg | ORAL_TABLET | Freq: Every day | ORAL | Status: DC | PRN
  Administered 2013-05-03 – 2013-05-06 (×2): 10 mg via ORAL
  Filled 2013-05-02 (×3): qty 1

## 2013-05-02 MED ORDER — PREDNISONE 20 MG PO TABS
20.0000 mg | ORAL_TABLET | Freq: Every day | ORAL | Status: DC
  Administered 2013-05-02: 20 mg via ORAL
  Filled 2013-05-02 (×2): qty 1

## 2013-05-02 MED ORDER — ACETAMINOPHEN 325 MG PO TABS
650.0000 mg | ORAL_TABLET | Freq: Four times a day (QID) | ORAL | Status: DC | PRN
  Administered 2013-05-07 – 2013-05-08 (×3): 650 mg via ORAL
  Filled 2013-05-02 (×4): qty 2

## 2013-05-02 MED ORDER — FENTANYL CITRATE 0.05 MG/ML IJ SOLN
100.0000 ug | Freq: Once | INTRAMUSCULAR | Status: AC
  Administered 2013-05-02: 100 ug via INTRAVENOUS
  Filled 2013-05-02: qty 2

## 2013-05-02 MED ORDER — HEPARIN SODIUM (PORCINE) 5000 UNIT/ML IJ SOLN
5000.0000 [IU] | Freq: Three times a day (TID) | INTRAMUSCULAR | Status: DC
  Administered 2013-05-02 – 2013-05-10 (×23): 5000 [IU] via SUBCUTANEOUS
  Filled 2013-05-02 (×28): qty 1

## 2013-05-02 MED ORDER — OXYCODONE-ACETAMINOPHEN 5-325 MG PO TABS
1.0000 | ORAL_TABLET | ORAL | Status: DC | PRN
  Administered 2013-05-02 – 2013-05-06 (×19): 2 via ORAL
  Administered 2013-05-07 – 2013-05-08 (×4): 1 via ORAL
  Administered 2013-05-09: 2 via ORAL
  Administered 2013-05-09 – 2013-05-10 (×3): 1 via ORAL
  Filled 2013-05-02 (×8): qty 2
  Filled 2013-05-02: qty 1
  Filled 2013-05-02 (×2): qty 2
  Filled 2013-05-02: qty 1
  Filled 2013-05-02 (×3): qty 2
  Filled 2013-05-02: qty 1
  Filled 2013-05-02 (×2): qty 2
  Filled 2013-05-02 (×2): qty 1
  Filled 2013-05-02 (×3): qty 2
  Filled 2013-05-02: qty 1
  Filled 2013-05-02: qty 2
  Filled 2013-05-02: qty 1
  Filled 2013-05-02: qty 2

## 2013-05-02 MED ORDER — DEXTROSE 5 % IV SOLN
100.0000 mg | Freq: Two times a day (BID) | INTRAVENOUS | Status: DC
  Administered 2013-05-02 – 2013-05-03 (×3): 100 mg via INTRAVENOUS
  Filled 2013-05-02 (×5): qty 100

## 2013-05-02 MED ORDER — PREDNISONE 20 MG PO TABS
20.0000 mg | ORAL_TABLET | Freq: Every day | ORAL | Status: DC
  Administered 2013-05-03: 20 mg via ORAL
  Filled 2013-05-02 (×3): qty 1

## 2013-05-02 NOTE — ED Provider Notes (Signed)
I have personally seen and examined the patient.  I have discussed the plan of care with the resident.  I have reviewed the documentation on PMH/FH/Soc. History.  I have reviewed the documentation of the resident and agree.  Doug Sou, MD 05/02/13 [phone]

## 2013-05-02 NOTE — Progress Notes (Addendum)
PATIENT DETAILS Name: Seth Martinez Age: 62 y.o. Sex: male Date of Birth: 23-Jun-1951 Admit Date: 05/01/2013 Admitting Physician Hillary Bow, DO JXB:JYNWG, Dorinda Hill, MD  Subjective: Admitted with fever, cough cold-for 1.5 weeks, then started having left knee pain swelling, right shoulder pain, right hip pain and right index finger pain/swelling.   Assessment/Plan: Principal Problem: Polyarticular Arthritis -following URI symptoms-did have one day of diarrhea-Synovial fluid WBC 42070, gram stain of synovial fluid neg, synovial cx and blood cultures neg -suspect this is ?reactive arthritis, although synovial/blood cultures are still pending -spoke with ID-Dr Comer-suggested we get Urine for G/C-since no diarrhea-no point in undergoing work up -will continue with IV Vanco/Doxy for now and await blood and synovial fluid cultures-if positive will consult ID and Ortho -check ANA, Lyme's titre as as well, if cultures are neg could check HLA B27 to see if patient has a genetic predisposition.  -Patient claims that he has a NSAID allergy-"all my joints swell up"-therefore will use low dose systemic prednisone 20 mg daily -monitor and follow clinical course closely  Chronic Back pain -c/w supportive care  Disposition: Remain inpatient  DVT Prophylaxis: Prophylactic Heparin  Code Status: Full code   Family Communication -spouse at bedside  Procedures:  Arthrocentesis 10/13  CONSULTS:  None   MEDICATIONS: Scheduled Meds: . sodium chloride   Intravenous STAT  . doxycycline (VIBRAMYCIN) IV  100 mg Intravenous Q12H  . heparin  5,000 Units Subcutaneous Q8H  . [START ON 05/03/2013] predniSONE  20 mg Oral Q breakfast  . vancomycin  1,500 mg Intravenous Q12H   Continuous Infusions:  PRN Meds:.acetaminophen, cyclobenzaprine, oxyCODONE-acetaminophen  Antibiotics: Anti-infectives   Start     Dose/Rate Route Frequency Ordered Stop   05/02/13 1000  vancomycin (VANCOCIN)  1,500 mg in sodium chloride 0.9 % 500 mL IVPB     1,500 mg 250 mL/hr over 120 Minutes Intravenous Every 12 hours 05/02/13 0233     05/02/13 0200  doxycycline (VIBRAMYCIN) 100 mg in dextrose 5 % 250 mL IVPB     100 mg 125 mL/hr over 120 Minutes Intravenous Every 12 hours 05/02/13 0148     05/02/13 0000  vancomycin (VANCOCIN) 2,500 mg in sodium chloride 0.9 % 500 mL IVPB     2,500 mg 250 mL/hr over 120 Minutes Intravenous  Once 05/01/13 2345 05/02/13 0212       PHYSICAL EXAM: Vital signs in last 24 hours: Filed Vitals:   05/02/13 0102 05/02/13 0630 05/02/13 0649 05/02/13 0814  BP: 130/77 181/127 107/68   Pulse: 85 87    Temp: 99.3 F (37.4 C) 98.8 F (37.1 C)  101.2 F (38.4 C)  TempSrc: Oral Oral  Oral  Resp: 22 21    Height: 5\' 9"  (1.753 m)     Weight: 147.9 kg (326 lb 1 oz)     SpO2: 93% 94%      Weight change:  Filed Weights   05/01/13 2116 05/02/13 0102  Weight: 145.605 kg (321 lb) 147.9 kg (326 lb 1 oz)   Body mass index is 48.13 kg/(m^2).   Gen Exam: Awake and alert with clear speech.  Neck: Supple, No JVD.   Chest: B/L Clear.  CVS: S1 S2 Regular, no murmurs.  Abdomen: soft, BS +, non tender, non distended.  Extremities: no edema, lower extremities warm to touch. Left Knee swollen/erythematous/tender, right groin tender, right shoulder pain, right index finger-proximal part swollen and tender/red(?sausafe digit) Neurologic: Non Focal.   Skin: No Rash.   Wounds: N/A.  Intake/Output from previous day:  Intake/Output Summary (Last 24 hours) at 05/02/13 1039 Last data filed at 05/02/13 0844  Gross per 24 hour  Intake   1000 ml  Output    400 ml  Net    600 ml     LAB RESULTS: CBC  Recent Labs Lab 04/30/13 1440 05/01/13 2215 05/02/13 0345  WBC 15.4* 16.6* 14.2*  HGB 14.9 13.4 13.2  HCT 42.5 38.7* 37.6*  PLT 185 199 176  MCV 86.4 86.0 86.6  MCH 30.3 29.8 30.4  MCHC 35.1 34.6 35.1  RDW 13.5 13.8 13.8  LYMPHSABS 0.7 0.9  --   MONOABS 1.6* 2.0*   --   EOSABS 0.0 0.0  --   BASOSABS 0.1 0.0  --     Chemistries   Recent Labs Lab 04/30/13 1440 05/02/13 0345  NA 134* 135  K 4.0 3.3*  CL 96 99  CO2 21 24  GLUCOSE 137* 122*  BUN 16 18  CREATININE 1.37* 1.20  CALCIUM 8.9 7.8*    CBG:  Recent Labs Lab 05/02/13 0039  GLUCAP 164*    GFR Estimated Creatinine Clearance: 90.5 ml/min (by C-G formula based on Cr of 1.2).  Coagulation profile No results found for this basename: INR, PROTIME,  in the last 168 hours  Cardiac Enzymes No results found for this basename: CK, CKMB, TROPONINI, MYOGLOBIN,  in the last 168 hours  No components found with this basename: POCBNP,  No results found for this basename: DDIMER,  in the last 72 hours No results found for this basename: HGBA1C,  in the last 72 hours No results found for this basename: CHOL, HDL, LDLCALC, TRIG, CHOLHDL, LDLDIRECT,  in the last 72 hours No results found for this basename: TSH, T4TOTAL, FREET3, T3FREE, THYROIDAB,  in the last 72 hours No results found for this basename: VITAMINB12, FOLATE, FERRITIN, TIBC, IRON, RETICCTPCT,  in the last 72 hours No results found for this basename: LIPASE, AMYLASE,  in the last 72 hours  Urine Studies No results found for this basename: UACOL, UAPR, USPG, UPH, UTP, UGL, UKET, UBIL, UHGB, UNIT, UROB, ULEU, UEPI, UWBC, URBC, UBAC, CAST, CRYS, UCOM, BILUA,  in the last 72 hours  MICROBIOLOGY: Recent Results (from the past 240 hour(s))  GRAM STAIN     Status: None   Collection Time    05/01/13 10:28 PM      Result Value Range Status   Specimen Description SYNOVIAL FLUID KNEE LEFT   Final   Special Requests Normal   Final   Gram Stain     Final   Value: ABUNDANT WBC PRESENT,BOTH PMN AND MONONUCLEAR     NO ORGANISMS SEEN   Report Status 05/01/2013 FINAL   Final  BODY FLUID CULTURE     Status: None   Collection Time    05/01/13 10:28 PM      Result Value Range Status   Specimen Description SYNOVIAL FLUID KNEE LEFT   Final    Special Requests Normal   Final   Gram Stain     Final   Value: ABUNDANT WBC PRESENT,BOTH PMN AND MONONUCLEAR     NO ORGANISMS SEEN     Performed at [facility]     Performed at [facility]   Culture PENDING   Incomplete   Report Status PENDING   Incomplete    RADIOLOGY STUDIES/RESULTS: Dg Chest 2 View  04/30/2013   CLINICAL DATA:  Back pain and cough.  EXAM: CHEST  2  VIEW  COMPARISON:  No priors.  FINDINGS: Lung volumes are normal. No consolidative airspace disease. No pleural effusions. No pneumothorax. No pulmonary nodule or mass noted. Pulmonary vasculature and the cardiomediastinal silhouette are within normal limits. Old healed fracture of the distal right clavicle incidentally noted.  IMPRESSION: 1.  No radiographic evidence of acute cardiopulmonary disease.   Electronically Signed   By: Trudie Reed M.D.   On: 04/30/2013 15:22   Dg Lumbar Spine 2-3 Views  04/30/2013   CLINICAL DATA:  Back pain.  EXAM: LUMBAR SPINE - 2-3 VIEW  COMPARISON:  None.  FINDINGS: There is marked convex right scoliosis. Multilevel degenerative change is present. The patient is status post L4-S1 fusion. Hardware appears intact. No fracture is identified.  IMPRESSION: No acute finding.  Multilevel degenerative change. Status post L4-S1 fusion.   Electronically Signed   By: Drusilla Kanner M.D.   On: 04/30/2013 16:56   Dg Hip Complete Right  04/30/2013   CLINICAL DATA:  Low back pain and right-sided hip pain.  EXAM: RIGHT HIP - COMPLETE 2+ VIEW  COMPARISON:  No priors.  FINDINGS: AP view of the pelvis and AP and lateral views of the right hip demonstrate no definite acute displaced fracture, subluxation, dislocation, joint or soft tissue abnormality. Orthopedic fixation hardware is noted at the lumbosacral junction, and the fixation screw in the low was position on the left appears fractured. No prior studies are available for comparison.  IMPRESSION: 1. No acute radiographic abnormality of  the bony pelvis or the left hip. 2. Fracture of the inferior fixation screw on the left side in this patient status post PLIF at L4-S1. Whether or not this is an acute finding or has been present on prior outside studies is uncertain. Correlation with prior outside examinations is suggested.   Electronically Signed   By: Trudie Reed M.D.   On: 04/30/2013 16:50   Dg Knee 2 Views Left  05/02/2013   CLINICAL DATA:  Joint pain.  EXAM: LEFT KNEE - 1-2 VIEW  COMPARISON:  None.  FINDINGS: There is subcutaneous emphysema in a line along the medial knee, presumably from reported arthrocentesis. Small to moderate knee joint effusion, suprapatellar. No osteolysis. Mild tricompartmental osteoarthritis, without focal or advanced joint narrowing. No fracture, malalignment, or other focal osseous abnormality.  IMPRESSION: 1. Mild to moderate volume knee joint effusion. 2. Mild tricompartmental osteoarthritis. 3. Subcutaneous gas, presumably from reported arthrocentesis.   Electronically Signed   By: Tiburcio Pea M.D.   On: 05/02/2013 00:08   Dg Hand 2 View Right  05/02/2013   CLINICAL DATA:  Pain, no injury  EXAM: RIGHT HAND - 2 VIEW  COMPARISON:  None.  FINDINGS: There is no evidence of fracture or dislocation. There is no evidence of arthropathy or other focal bone abnormality. Mild soft tissue swelling is present  IMPRESSION: No acute osseous abnormality.   Electronically Signed   By: Davonna Belling M.D.   On: 05/02/2013 00:09    Jeoffrey Massed, MD  [facility] Pager:[phone]  If 7PM-7AM, please contact night-coverage [email or web address] Password [email or web address] 05/02/2013, 10:39 AM   LOS: 1 day

## 2013-05-02 NOTE — Progress Notes (Signed)
Pt arrived from ED via stretcher. Pt in no apparent distress at this time, has been oriented to room. rn paged MD on call for admission orders.  rn will continue to monitor pt.

## 2013-05-02 NOTE — H&P (Signed)
[facility] History and Physical  SHERILL WEGENER XBM:[id number] DOB: [birth year] DOA: 05/01/2013  Referring physician: ED PCP: Rudi Heap, MD  Chief Complaint: Fever, Knee pain, finger pain  HPI: Seth Martinez is a 62 y.o. male who presents to the ED with complaints of fever, as well as pain in R shoulder, R index finger MCP joint, and L knee.  Symptoms have been going on for several days, started initially on Friday with pain in his Hip.  Hip pain got better but then his shoulder, knee, and finger flared up.  Joints are red, hot, and swollen.  Ibuprofen didn't seem to help.  Fever with Tm of 103.2 subjective (103.1 objective tonight in ED).  Knee was tapped in ED due to concern for septic arthritis and hospitalist asked to admit.  Review of Systems: Negative for known tick bite, no travel outside of [place] recently or out of the country ever.  12 systems reviewed and otherwise negative.  Past Medical History  Diagnosis Date  . Hyperlipidemia   . Chronic back pain   . NIDDM (non-insulin dependent diabetes mellitus)   . Metabolic syndrome   . Depression   . Insomnia   . Fatigue   . Vertigo   . Joint pain   . Obesity   . Hypertension    Past Surgical History  Procedure Laterality Date  . Rt knee arthroscopic  07/2006  . Fusion lt sacrum with screws  1993  . Fix screws in sacrum  in office  1993  . Back surgery    . Hernia repair     Social History:  reports that he has never smoked. He does not have any smokeless tobacco history on file. He reports that he does not drink alcohol or use illicit drugs.   Allergies  Allergen Reactions  . Morphine And Related Anaphylaxis  . Penicillins Anaphylaxis  . Amitriptyline     unk  . Antara [Fenofibrate Micronized]     unk  . Bextra [Valdecoxib]     unk  . Cefdinir     unk  . Celebrex [Celecoxib]     unk  . Crestor [Rosuvastatin Calcium]     unk  . Escitalopram Oxalate     unk  . Fish Oil     unk  . Ibuprofen      unk  . Naproxen Sodium     unk  . Neurontin [Gabapentin]     unk  . Propoxyphene And Methadone     unk  . Skelaxin     unk  . Ultram [Tramadol]     unk  . Vioxx [Rofecoxib]     unk  . Zocor [Simvastatin]     unk    History reviewed. No pertinent family history.   Prior to Admission medications   Medication Sig Start Date End Date Taking? Authorizing Provider  acetaminophen (TYLENOL) 500 MG tablet Take 500 mg by mouth every 6 (six) hours as needed for pain.   Yes Historical Provider, MD  cyclobenzaprine (FLEXERIL) 10 MG tablet Take 10 mg by mouth daily as needed for muscle spasms.   Yes Historical Provider, MD  dextromethorphan (DELSYM) 30 MG/5ML liquid Take 60 mg by mouth every 12 (twelve) hours as needed for cough.   Yes Historical Provider, MD  ibuprofen (ADVIL,MOTRIN) 200 MG tablet Take 200 mg by mouth every 6 (six) hours as needed for pain.   Yes Historical Provider, MD  oxyCODONE-acetaminophen (PERCOCET/ROXICET) 5-325 MG per tablet Take 1 tablet  by mouth every 6 (six) hours as needed for pain. 04/30/13  Yes Dorna Leitz, MD  Pseudoeph-Doxylamine-DM-APAP (NYQUIL PO) Take 30 mLs by mouth at bedtime as needed (cold symptoms).   Yes Historical Provider, MD  Pseudoephedrine-APAP-DM (DAYQUIL PO) Take 30 mLs by mouth every 6 (six) hours as needed (cold symptoms).   Yes Historical Provider, MD   Physical Exam: Filed Vitals:   05/02/13 0102  BP: 130/77  Pulse: 85  Temp: 99.3 F (37.4 C)  Resp: 22    General:  NAD, resting comfortably in bed Eyes: PEERLA EOMI ENT: mucous membranes moist Neck: supple w/o JVD Cardiovascular: RRR w/o MRG Respiratory: CTA B Abdomen: soft, nt, nd, bs+ Skin: L knee and R index finger MCP are red, hot, and swollen Musculoskeletal: MAE, full ROM all 4 extremities Psychiatric: normal tone and affect Neurologic: AAOx3, grossly non-focal  Labs on Admission:  Basic Metabolic Panel:  Recent Labs Lab 04/30/13 1440  NA 134*  K 4.0  CL 96   CO2 21  GLUCOSE 137*  BUN 16  CREATININE 1.37*  CALCIUM 8.9   Liver Function Tests:  Recent Labs Lab 04/30/13 1440  AST 40*  ALT 27  ALKPHOS 84  BILITOT 1.1  PROT 7.3  ALBUMIN 3.2*   No results found for this basename: LIPASE, AMYLASE,  in the last 168 hours No results found for this basename: AMMONIA,  in the last 168 hours CBC:  Recent Labs Lab 04/30/13 1440 05/01/13 2215  WBC 15.4* 16.6*  NEUTROABS 13.0* 13.7*  HGB 14.9 13.4  HCT 42.5 38.7*  MCV 86.4 86.0  PLT 185 199   Cardiac Enzymes: No results found for this basename: CKTOTAL, CKMB, CKMBINDEX, TROPONINI,  in the last 168 hours  BNP (last 3 results) No results found for this basename: PROBNP,  in the last 8760 hours CBG:  Recent Labs Lab 05/02/13 0039  GLUCAP 164*    Radiological Exams on Admission: Dg Chest 2 View  04/30/2013   CLINICAL DATA:  Back pain and cough.  EXAM: CHEST  2 VIEW  COMPARISON:  No priors.  FINDINGS: Lung volumes are normal. No consolidative airspace disease. No pleural effusions. No pneumothorax. No pulmonary nodule or mass noted. Pulmonary vasculature and the cardiomediastinal silhouette are within normal limits. Old healed fracture of the distal right clavicle incidentally noted.  IMPRESSION: 1.  No radiographic evidence of acute cardiopulmonary disease.   Electronically Signed   By: Trudie Reed M.D.   On: 04/30/2013 15:22   Dg Lumbar Spine 2-3 Views  04/30/2013   CLINICAL DATA:  Back pain.  EXAM: LUMBAR SPINE - 2-3 VIEW  COMPARISON:  None.  FINDINGS: There is marked convex right scoliosis. Multilevel degenerative change is present. The patient is status post L4-S1 fusion. Hardware appears intact. No fracture is identified.  IMPRESSION: No acute finding.  Multilevel degenerative change. Status post L4-S1 fusion.   Electronically Signed   By: Drusilla Kanner M.D.   On: 04/30/2013 16:56   Dg Hip Complete Right  04/30/2013   CLINICAL DATA:  Low back pain and right-sided hip  pain.  EXAM: RIGHT HIP - COMPLETE 2+ VIEW  COMPARISON:  No priors.  FINDINGS: AP view of the pelvis and AP and lateral views of the right hip demonstrate no definite acute displaced fracture, subluxation, dislocation, joint or soft tissue abnormality. Orthopedic fixation hardware is noted at the lumbosacral junction, and the fixation screw in the low was position on the left appears fractured. No prior studies are available  for comparison.  IMPRESSION: 1. No acute radiographic abnormality of the bony pelvis or the left hip. 2. Fracture of the inferior fixation screw on the left side in this patient status post PLIF at L4-S1. Whether or not this is an acute finding or has been present on prior outside studies is uncertain. Correlation with prior outside examinations is suggested.   Electronically Signed   By: Trudie Reed M.D.   On: 04/30/2013 16:50   Dg Knee 2 Views Left  05/02/2013   CLINICAL DATA:  Joint pain.  EXAM: LEFT KNEE - 1-2 VIEW  COMPARISON:  None.  FINDINGS: There is subcutaneous emphysema in a line along the medial knee, presumably from reported arthrocentesis. Small to moderate knee joint effusion, suprapatellar. No osteolysis. Mild tricompartmental osteoarthritis, without focal or advanced joint narrowing. No fracture, malalignment, or other focal osseous abnormality.  IMPRESSION: 1. Mild to moderate volume knee joint effusion. 2. Mild tricompartmental osteoarthritis. 3. Subcutaneous gas, presumably from reported arthrocentesis.   Electronically Signed   By: Tiburcio Pea M.D.   On: 05/02/2013 00:08   Dg Hand 2 View Right  05/02/2013   CLINICAL DATA:  Pain, no injury  EXAM: RIGHT HAND - 2 VIEW  COMPARISON:  None.  FINDINGS: There is no evidence of fracture or dislocation. There is no evidence of arthropathy or other focal bone abnormality. Mild soft tissue swelling is present  IMPRESSION: No acute osseous abnormality.   Electronically Signed   By: Davonna Belling M.D.   On: 05/02/2013 00:09     EKG: Independently reviewed.  Assessment/Plan Principal Problem:   Septic arthritis of multiple joints   1. Septic arthritis of multiple joints - joint tap showed large number of WBCs, this combined with fever, lack of crystals, multiple joint involvement, highly suggestive of septic arthritis.  Gram stain negative so patient put on empiric vancomycin.  No history of tick bite but his history of "migratory arthritis" and the fact that [place] and goes outdoors regularly is highly suspicious for Lyme so I have added doxycycline empirically.  Urine PCR pending.  Day team will likely wish to involve either ID and/or ortho re: indications for joint wash out.    Code Status: Full Code (must indicate code status--if unknown or must be presumed, indicate so) Family Communication: Spoke with family at bedside (indicate person spoken with, if applicable, with phone number if by telephone) Disposition Plan: Admit to inpatient (indicate anticipated LOS)  Time spent: 70 min  GARDNER, JARED M. [facility] Pager [phone]  If 7PM-7AM, please contact night-coverage [email or web address] Password [email or web address] 05/02/2013, 2:25 AM

## 2013-05-02 NOTE — Progress Notes (Signed)
ANTIBIOTIC CONSULT NOTE - INITIAL  Pharmacy Consult for vancomycin Indication: suspected septic joint  Allergies  Allergen Reactions  . Morphine And Related Anaphylaxis  . Penicillins Anaphylaxis  . Amitriptyline     unk  . Antara [Fenofibrate Micronized]     unk  . Bextra [Valdecoxib]     unk  . Cefdinir     unk  . Celebrex [Celecoxib]     unk  . Crestor [Rosuvastatin Calcium]     unk  . Escitalopram Oxalate     unk  . Fish Oil     unk  . Ibuprofen     unk  . Naproxen Sodium     unk  . Neurontin [Gabapentin]     unk  . Propoxyphene And Methadone     unk  . Skelaxin     unk  . Ultram [Tramadol]     unk  . Vioxx [Rofecoxib]     unk  . Zocor [Simvastatin]     unk    Patient Measurements: Height: 5\' 9"  (175.3 cm) Weight: 326 lb 1 oz (147.9 kg) IBW/kg (Calculated) : 70.7  Vital Signs: Temp: 99.3 F (37.4 C) (10/14 0102) Temp src: Oral (10/14 0102) BP: 130/77 mmHg (10/14 0102) Pulse Rate: 85 (10/14 0102)  Labs:  Recent Labs  04/30/13 1440 05/01/13 2215  WBC 15.4* 16.6*  HGB 14.9 13.4  PLT 185 199  CREATININE 1.37*  --    Estimated Creatinine Clearance: 79.3 ml/min (by C-G formula based on Cr of 1.37).    Microbiology: Recent Results (from the past 720 hour(s))  GRAM STAIN     Status: None   Collection Time    05/01/13 10:28 PM      Result Value Range Status   Specimen Description SYNOVIAL FLUID KNEE LEFT   Final   Special Requests Normal   Final   Gram Stain     Final   Value: ABUNDANT WBC PRESENT,BOTH PMN AND MONONUCLEAR     NO ORGANISMS SEEN   Report Status 05/01/2013 FINAL   Final    Medical History: Past Medical History  Diagnosis Date  . Hyperlipidemia   . Chronic back pain   . NIDDM (non-insulin dependent diabetes mellitus)   . Metabolic syndrome   . Depression   . Insomnia   . Fatigue   . Vertigo   . Joint pain   . Obesity   . Hypertension     Medications:  Prescriptions prior to admission  Medication Sig Dispense  Refill  . acetaminophen (TYLENOL) 500 MG tablet Take 500 mg by mouth every 6 (six) hours as needed for pain.      . cyclobenzaprine (FLEXERIL) 10 MG tablet Take 10 mg by mouth daily as needed for muscle spasms.      Marland Kitchen dextromethorphan (DELSYM) 30 MG/5ML liquid Take 60 mg by mouth every 12 (twelve) hours as needed for cough.      Marland Kitchen ibuprofen (ADVIL,MOTRIN) 200 MG tablet Take 200 mg by mouth every 6 (six) hours as needed for pain.      Marland Kitchen oxyCODONE-acetaminophen (PERCOCET/ROXICET) 5-325 MG per tablet Take 1 tablet by mouth every 6 (six) hours as needed for pain.  20 tablet  0  . Pseudoeph-Doxylamine-DM-APAP (NYQUIL PO) Take 30 mLs by mouth at bedtime as needed (cold symptoms).      . Pseudoephedrine-APAP-DM (DAYQUIL PO) Take 30 mLs by mouth every 6 (six) hours as needed (cold symptoms).       Scheduled:  . sodium chloride  Intravenous STAT  . doxycycline (VIBRAMYCIN) IV  100 mg Intravenous Q12H  . heparin  5,000 Units Subcutaneous Q8H  . vancomycin  1,500 mg Intravenous Q12H    Assessment: 62yo male c/o multiple joint aches limiting mobility, had fever of 103.1 that improved w/ APAP, concern for septic arthritis, to begin IV ABX.  Goal of Therapy:  Vancomycin trough level 15-20 mcg/ml  Plan:  Rec'd vancomycin 2500mg  IV in ED; will continue with 1500mg  IV Q12H and monitor CBC, Cx, levels prn.  Vernard Gambles, PharmD, BCPS  05/02/2013,2:33 AM

## 2013-05-03 ENCOUNTER — Inpatient Hospital Stay (HOSPITAL_COMMUNITY): Payer: Medicare Other

## 2013-05-03 ENCOUNTER — Encounter (HOSPITAL_COMMUNITY): Payer: Self-pay | Admitting: Radiology

## 2013-05-03 ENCOUNTER — Ambulatory Visit: Payer: Self-pay | Admitting: Family Medicine

## 2013-05-03 ENCOUNTER — Telehealth: Payer: Self-pay | Admitting: Family Medicine

## 2013-05-03 DIAGNOSIS — E119 Type 2 diabetes mellitus without complications: Secondary | ICD-10-CM

## 2013-05-03 DIAGNOSIS — A4901 Methicillin susceptible Staphylococcus aureus infection, unspecified site: Secondary | ICD-10-CM

## 2013-05-03 DIAGNOSIS — I517 Cardiomegaly: Secondary | ICD-10-CM

## 2013-05-03 DIAGNOSIS — R509 Fever, unspecified: Secondary | ICD-10-CM

## 2013-05-03 LAB — CBC
HCT: 42.6 % (ref 39.0–52.0)
Hemoglobin: 14.1 g/dL (ref 13.0–17.0)
MCH: 29.4 pg (ref 26.0–34.0)
MCV: 88.8 fL (ref 78.0–100.0)
Platelets: 241 10*3/uL (ref 150–400)
WBC: 20.1 10*3/uL — ABNORMAL HIGH (ref 4.0–10.5)

## 2013-05-03 LAB — BASIC METABOLIC PANEL
BUN: 16 mg/dL (ref 6–23)
Chloride: 97 mEq/L (ref 96–112)
Creatinine, Ser: 1.08 mg/dL (ref 0.50–1.35)
GFR calc Af Amer: 82 mL/min — ABNORMAL LOW (ref >90)
GFR calc non Af Amer: 71 mL/min — ABNORMAL LOW (ref >90)
Glucose, Bld: 110 mg/dL — ABNORMAL HIGH (ref 70–99)
Potassium: 3.6 mEq/L (ref 3.5–5.1)

## 2013-05-03 LAB — SURGICAL PCR SCREEN: MRSA, PCR: POSITIVE — AB

## 2013-05-03 MED ORDER — CHLORHEXIDINE GLUCONATE 4 % EX LIQD
60.0000 mL | Freq: Once | CUTANEOUS | Status: DC
  Filled 2013-05-03: qty 60

## 2013-05-03 MED ORDER — GENTAMICIN SULFATE 40 MG/ML IJ SOLN
40.0000 mg | Freq: Once | INTRAMUSCULAR | Status: DC
  Filled 2013-05-03: qty 2

## 2013-05-03 MED ORDER — CLINDAMYCIN PHOSPHATE 900 MG/50ML IV SOLN
900.0000 mg | INTRAVENOUS | Status: AC
  Administered 2013-05-04: 900 mg via INTRAVENOUS
  Filled 2013-05-03: qty 50

## 2013-05-03 MED ORDER — DEXTROSE-NACL 5-0.45 % IV SOLN
100.0000 mL/h | INTRAVENOUS | Status: DC
  Administered 2013-05-03: 100 mL/h via INTRAVENOUS

## 2013-05-03 NOTE — Consult Note (Signed)
Ophthalmology Consult  62 y/o gentleman with h/o myopia now with loss of vision in the left eye.  Last eye exam 1-2 years ago.  Pt wears glasses for distance and near.  Pt states had floaters then developed loss of vision in the left eye.  Pt has floaters in the right eye.  On exam vision was 20/20 in the right eye and CF at 3 ft in the left eye.  Pupils were equally round and reactive to light and extraocular motility was intact.  Visual field was full in the right eye and unable in the left eye.  Intraocular pressure was 14 in both eyes.    On slitlamp exam cornea was clear in both eyes and pt had mild cataracts in both eyes.  On dilated exam pt retina was flat and intact in the right eye and pt had macula off retinal detachment in the left eye.  There were occasional small hemorrhages in the periphery of the left eye and there was a whitish exudate superior to bullous detachment.  No vitritis was seen.  A/P 1.Retinal detachment left eye macula off:  Will consult retina today to evaluate and plan treatment options.  The macula is off which affects final visual potential.  This was discussed with patient in detail.  Does not appear inflammatory in nature due to no vitritis seen and view of retina equally clear in both eyes.  Thank you for allowing me to participate in the care of this interesting payient.  Please feel free to contact me if you have any concerns.  Mia Creek, M.D. (cell) [phone] (office) [phone]

## 2013-05-03 NOTE — Consult Note (Signed)
  OPHTHALMOLOGY  [facility]- Edmon Crape, MD   CHART REVIEWED, PATIENT EXAMINED AT BEDSIDE, WITH DAUGHTER AND WIFE PRESENT.   Dr Vonna Kotyk asked for my second opinion and evaluation.  Patient found to have staphylococcal bacteremia, with multiple sites of septic infiltration.  Knee,  And with 2 days of vision losses Left eye , only.  Symptoms of central scotoma OS, onset 2 days ago, with stable acuity left eye yesterday at count fingers , and symptomatically slightly improved acuity left eye now, per patient report.  On vancomycin iv for bacteremia and multisite infection.  My review, does not disclose known source.  Eyes dilated tonite by earlier exam by Dr Vonna Kotyk.    ANTERIOR SEGMENT  Of each eye is normal to bedside exam.  Cornea, media clear. Iris clearly seen.  Lenses clear.  Vitreous OD is clear, and retinal vessels, macula, nerve and periphery are normal.   OS,, multiple white vitreous opacties , debris, mobile over macula.  Retinal periphery, vessels and macula are ATTACHED, and small region of intraretinal whitening temporal portion of macula OS, may be site of focal chorioretinitis.    Impression: Mild vitritis OS,  With multiple sites of vitreous debris, no abcess.  Symptomatically, vision OS, improved some 2 days post commencement of  IV antibiotics.    Small focal septic chorioretinitis, macula OS, likely to improve on systemic antibiotics.  Patient instructed with family to self monitoring for visual acuity decline OS, every 4 hours.  Report to nurses, family or physician any dramatic decline in acuity.    Will monitor with Dr Vonna Kotyk.  F/u to office setting upon hospital discharge to look for sequalae, and monitor for diabetic retinopathy.   Aiyanna Awtrey,MD [facility]

## 2013-05-03 NOTE — Progress Notes (Addendum)
PATIENT DETAILS Name: Seth Martinez Age: 62 y.o. Sex: male Date of Birth: 08/24/1950 Admit Date: 05/01/2013 Admitting Physician Hillary Bow, DO ZOX:WRUEA, Dorinda Hill, MD  Subjective: Admitted with fever, cough cold-for 1.5 weeks, then started having left knee pain swelling, right shoulder pain, right hip pain and right index finger pain/swelling.   Assessment/Plan: Principal Problem: Polyarticular Arthritis/ septic arthritis:  -following URI symptoms-did have one day of diarrhea. - Multiple joint pains associated with redness and swelling. He underwent arthrocentesis. The analysis showed Synovial fluid WBC 42070, gram stain of synovial fluid neg, synovial cx and blood cultures neg. - Blood cultures show staph aureus and awaiting sensitivities.  -will continue with IV Vanco for now and await sensitivities.  -monitor and follow clinical course closely. - cardiology called for TEE in am to evaluate for endocarditis  Left Retinal detachment: Overnight patient complained of sudden vision in the left eye . Ophthalmology consulted, was found to have retinal detachment in the left macula.  CT head did nto show any acute stroke.   DVT prophylaxis   Chronic Back pain -c/w supportive care  Disposition: Remain inpatient  DVT Prophylaxis: Prophylactic Heparin  Code Status: Full code   Family Communication -spouse at bedside  Procedures:  Arthrocentesis 10/13  CONSULTS: ID OPHTHALMOLOGY orthopedics   MEDICATIONS: Scheduled Meds: . heparin  5,000 Units Subcutaneous Q8H  . influenza vac split quadrivalent PF  0.5 mL Intramuscular Tomorrow-1000  . polyethylene glycol  17 g Oral BID  . predniSONE  20 mg Oral Q breakfast  . senna  2 tablet Oral BID  . vancomycin  1,500 mg Intravenous Q12H   Continuous Infusions:  PRN Meds:.acetaminophen, cyclobenzaprine, oxyCODONE-acetaminophen  Antibiotics: Anti-infectives   Start     Dose/Rate Route Frequency Ordered Stop    05/02/13 1000  vancomycin (VANCOCIN) 1,500 mg in sodium chloride 0.9 % 500 mL IVPB     1,500 mg 250 mL/hr over 120 Minutes Intravenous Every 12 hours 05/02/13 0233     05/02/13 0200  doxycycline (VIBRAMYCIN) 100 mg in dextrose 5 % 250 mL IVPB  Status:  Discontinued     100 mg 125 mL/hr over 120 Minutes Intravenous Every 12 hours 05/02/13 0148 05/03/13 1111   05/02/13 0000  vancomycin (VANCOCIN) 2,500 mg in sodium chloride 0.9 % 500 mL IVPB     2,500 mg 250 mL/hr over 120 Minutes Intravenous  Once 05/01/13 2345 05/02/13 0212       PHYSICAL EXAM: Vital signs in last 24 hours: Filed Vitals:   05/02/13 0814 05/02/13 1422 05/02/13 2212 05/03/13 0513  BP:  146/73 134/81 155/76  Pulse:  95 92 87  Temp: 101.2 F (38.4 C) 98.5 F (36.9 C) 98.5 F (36.9 C) 99.9 F (37.7 C)  TempSrc: Oral Oral Oral Oral  Resp:  22 20 20   Height:      Weight:      SpO2:  92% 94% 95%    Weight change:  Filed Weights   05/01/13 2116 05/02/13 0102  Weight: 145.605 kg (321 lb) 147.9 kg (326 lb 1 oz)   Body mass index is 48.13 kg/(m^2).   Gen Exam: Awake and alert with clear speech.  Chest: B/L Clear.  CVS: S1 S2 Regular, no murmurs.  Abdomen: soft, BS +, non tender, non distended.  Extremities: no edema, lower extremities warm to touch. Left Knee swollen/erythematous/tender, right groin tender, right shoulder pain, right index finger-proximal part swollen and tender/red(?sausafe digit)left ankle swollen.  Neurologic: Non Focal.   Skin: No Rash.  Intake/Output from previous day:  Intake/Output Summary (Last 24 hours) at 05/03/13 1450 Last data filed at 05/03/13 0550  Gross per 24 hour  Intake    750 ml  Output   1425 ml  Net   -675 ml     LAB RESULTS: CBC  Recent Labs Lab 04/30/13 1440 05/01/13 2215 05/02/13 0345 05/03/13 0605  WBC 15.4* 16.6* 14.2* 20.1*  HGB 14.9 13.4 13.2 14.1  HCT 42.5 38.7* 37.6* 42.6  PLT 185 199 176 241  MCV 86.4 86.0 86.6 88.8  MCH 30.3 29.8 30.4 29.4   MCHC 35.1 34.6 35.1 33.1  RDW 13.5 13.8 13.8 14.3  LYMPHSABS 0.7 0.9  --   --   MONOABS 1.6* 2.0*  --   --   EOSABS 0.0 0.0  --   --   BASOSABS 0.1 0.0  --   --     Chemistries   Recent Labs Lab 04/30/13 1440 05/02/13 0345 05/03/13 0605  NA 134* 135 138  K 4.0 3.3* 3.6  CL 96 99 97  CO2 21 24 25   GLUCOSE 137* 122* 110*  BUN 16 18 16   CREATININE 1.37* 1.20 1.08  CALCIUM 8.9 7.8* 8.5    CBG:  Recent Labs Lab 05/02/13 0039  GLUCAP 164*    GFR Estimated Creatinine Clearance: 100.6 ml/min (by C-G formula based on Cr of 1.08).  Coagulation profile No results found for this basename: INR, PROTIME,  in the last 168 hours  Cardiac Enzymes No results found for this basename: CK, CKMB, TROPONINI, MYOGLOBIN,  in the last 168 hours  No components found with this basename: POCBNP,  No results found for this basename: DDIMER,  in the last 72 hours No results found for this basename: HGBA1C,  in the last 72 hours No results found for this basename: CHOL, HDL, LDLCALC, TRIG, CHOLHDL, LDLDIRECT,  in the last 72 hours No results found for this basename: TSH, T4TOTAL, FREET3, T3FREE, THYROIDAB,  in the last 72 hours No results found for this basename: VITAMINB12, FOLATE, FERRITIN, TIBC, IRON, RETICCTPCT,  in the last 72 hours No results found for this basename: LIPASE, AMYLASE,  in the last 72 hours  Urine Studies No results found for this basename: UACOL, UAPR, USPG, UPH, UTP, UGL, UKET, UBIL, UHGB, UNIT, UROB, ULEU, UEPI, UWBC, URBC, UBAC, CAST, CRYS, UCOM, BILUA,  in the last 72 hours  MICROBIOLOGY: Recent Results (from the past 240 hour(s))  CULTURE, BLOOD (ROUTINE X 2)     Status: None   Collection Time    05/01/13 10:15 PM      Result Value Range Status   Specimen Description BLOOD FOREARM LEFT IV START   Final   Special Requests BOTTLES DRAWN AEROBIC AND ANAEROBIC 10CC EA   Final   Culture  Setup Time     Final   Value: 05/02/2013 07:40     Performed at [facility]   Culture     Final   Value: STAPHYLOCOCCUS AUREUS     Note: RIFAMPIN AND GENTAMICIN SHOULD NOT BE USED AS SINGLE DRUGS FOR TREATMENT OF STAPH INFECTIONS.     Note: Gram Stain Report Called to,Read Back By and Verified With: PEACE DORMON ON 05/02/2013 AT 10:06P BY Serafina Mitchell     Performed at [facility]   Report Status PENDING   Incomplete  GRAM STAIN     Status: None   Collection Time    05/01/13 10:28 PM      Result Value  Range Status   Specimen Description SYNOVIAL FLUID KNEE LEFT   Final   Special Requests Normal   Final   Gram Stain     Final   Value: ABUNDANT WBC PRESENT,BOTH PMN AND MONONUCLEAR     NO ORGANISMS SEEN   Report Status 05/01/2013 FINAL   Final  BODY FLUID CULTURE     Status: None   Collection Time    05/01/13 10:28 PM      Result Value Range Status   Specimen Description SYNOVIAL FLUID KNEE LEFT   Final   Special Requests Normal   Final   Gram Stain     Final   Value: ABUNDANT WBC PRESENT,BOTH PMN AND MONONUCLEAR     NO ORGANISMS SEEN     Performed at [facility]     Performed at [facility]   Culture PENDING   Incomplete   Report Status PENDING   Incomplete  CULTURE, BLOOD (ROUTINE X 2)     Status: None   Collection Time    05/01/13 11:48 PM      Result Value Range Status   Specimen Description BLOOD LEFT HAND   Final   Special Requests BOTTLES DRAWN AEROBIC ONLY 8CC   Final   Culture  Setup Time     Final   Value: 05/02/2013 07:39     Performed at [facility]   Culture     Final   Value: STAPHYLOCOCCUS AUREUS     Note: Gram Stain Report Called to,Read Back By and Verified With: PEACE DORMON ON 05/02/2013 AT 10:06P BY Serafina Mitchell     Performed at [facility]   Report Status PENDING   Incomplete    RADIOLOGY STUDIES/RESULTS: Dg Chest 2 View  04/30/2013   CLINICAL DATA:  Back pain and cough.  EXAM: CHEST  2 VIEW  COMPARISON:  No priors.  FINDINGS: Lung volumes are normal. No consolidative airspace disease.  No pleural effusions. No pneumothorax. No pulmonary nodule or mass noted. Pulmonary vasculature and the cardiomediastinal silhouette are within normal limits. Old healed fracture of the distal right clavicle incidentally noted.  IMPRESSION: 1.  No radiographic evidence of acute cardiopulmonary disease.   Electronically Signed   By: Trudie Reed M.D.   On: 04/30/2013 15:22   Dg Lumbar Spine 2-3 Views  04/30/2013   CLINICAL DATA:  Back pain.  EXAM: LUMBAR SPINE - 2-3 VIEW  COMPARISON:  None.  FINDINGS: There is marked convex right scoliosis. Multilevel degenerative change is present. The patient is status post L4-S1 fusion. Hardware appears intact. No fracture is identified.  IMPRESSION: No acute finding.  Multilevel degenerative change. Status post L4-S1 fusion.   Electronically Signed   By: Drusilla Kanner M.D.   On: 04/30/2013 16:56   Dg Hip Complete Right  04/30/2013   CLINICAL DATA:  Low back pain and right-sided hip pain.  EXAM: RIGHT HIP - COMPLETE 2+ VIEW  COMPARISON:  No priors.  FINDINGS: AP view of the pelvis and AP and lateral views of the right hip demonstrate no definite acute displaced fracture, subluxation, dislocation, joint or soft tissue abnormality. Orthopedic fixation hardware is noted at the lumbosacral junction, and the fixation screw in the low was position on the left appears fractured. No prior studies are available for comparison.  IMPRESSION: 1. No acute radiographic abnormality of the bony pelvis or the left hip. 2. Fracture of the inferior fixation screw on the left side in this patient status post PLIF at  L4-S1. Whether or not this is an acute finding or has been present on prior outside studies is uncertain. Correlation with prior outside examinations is suggested.   Electronically Signed   By: Trudie Reed M.D.   On: 04/30/2013 16:50   Dg Knee 2 Views Left  05/02/2013   CLINICAL DATA:  Joint pain.  EXAM: LEFT KNEE - 1-2 VIEW  COMPARISON:  None.  FINDINGS: There is  subcutaneous emphysema in a line along the medial knee, presumably from reported arthrocentesis. Small to moderate knee joint effusion, suprapatellar. No osteolysis. Mild tricompartmental osteoarthritis, without focal or advanced joint narrowing. No fracture, malalignment, or other focal osseous abnormality.  IMPRESSION: 1. Mild to moderate volume knee joint effusion. 2. Mild tricompartmental osteoarthritis. 3. Subcutaneous gas, presumably from reported arthrocentesis.   Electronically Signed   By: Tiburcio Pea M.D.   On: 05/02/2013 00:08   Dg Hand 2 View Right  05/02/2013   CLINICAL DATA:  Pain, no injury  EXAM: RIGHT HAND - 2 VIEW  COMPARISON:  None.  FINDINGS: There is no evidence of fracture or dislocation. There is no evidence of arthropathy or other focal bone abnormality. Mild soft tissue swelling is present  IMPRESSION: No acute osseous abnormality.   Electronically Signed   By: Davonna Belling M.D.   On: 05/02/2013 00:09    Taliya Mcclard, MD  [facility] Pager:[phone]  If 7PM-7AM, please contact night-coverage [email or web address] Password [email or web address] 05/03/2013, 2:50 PM   LOS: 2 days

## 2013-05-03 NOTE — Progress Notes (Signed)
Comment:   Consulting orthopedic surgeon Dr Eulah Pont called to report that he plans to take pt to OR early AM (0700) tomo for an arthroscopic procedure on (L) knee. He noted in record that pt for TEE tomorrow and he is inquiring as to whether or not it will be ok to take pt to OR before TEE. I spoke w/ Dr Blake Divine by phone regarding this matter and she reports she did speak w/ cardiology today. 2-D Echo has been done and cardiology states ok for pt to go to surgery prior to TEE. Dr Eulah Pont notified. Went by to follow up with pt at bedside regarding the vision changes in his (L) eye and subsequent opthalmology assessment last night. During our conversation wife clarified that pt was only aware of the floaters he was experiencing when he saw Dr Jerral Ralph yesterday early afternoon. He only became aware of the vision changes late yesterday afternoon approx 5-6pm just prior to shift change. Pt denies new c/o's at this time. Will continue to monitor closely.  Leanne Chang, NP-C [facility] Pager [phone]

## 2013-05-03 NOTE — Progress Notes (Signed)
Event: Notified by RN that pt c/o loss of vision in his (L) eye. States he can only see shapes and colors but w/ his (R) eye closed he is unable to make out what he is looking at. He denies pain, burning, excessive tearing or other c/o's. Pt is requesting someone come to see him. NP to bedside. Subjective: Pt states he was actually feeling some better today.  Yesterday he began to see floaters in his (L) eye. He has had floaters before but never "as many" as he noted yesterday. Then today he began to notice floaters in his (R) eye which he had never experienced before. Early today he began to notice that he could not see out of his (L) eye. He reports he can see colors and shapes but without his (R) eye open he is unable to tell what he was looking at. Pt denies that there has been pain, burning or other symptoms. Pt states he mentioned to Dr Jerral Ralph earlier today and he did briefly examine his eye but did not offer any explanation for his sudden loss of vision. Objective: Seth Martinez is a 62 y/o gentleman that was admitted 05/01/13 after onset of high fever (103) following an approx 1.5 week h/o cough and cold like symptoms at home that were actually resolving when he spiked a fever. After admission pt developed swelling, pain and erythema to his (L) knee and (R) index finger and pain to his (R) shoulder. WBC was 16.6. Pt was started on Vanc and Doxycycline and has been awaiting synovial fluid and blood cultures. ANA and Lyme's titer are also pending. At bedside pt noted in NAD. Upon exam PEARRL and EOMI, conjunctiva and sclera appear normal, there is no d/c. Visual acuity assessment w/  hand held Snellen Chart reveals OU/corrected 20/25, OD/corrected 20/25, OS/corrected (pt states he is only able to see that he is holding the card but is unable to make out what the shapes are on the card). Assessment of visual fields to confrontation reveal intact fields of vision in (R) eye. The pt reports he is able to see  shapes in all visual fields of his (L) eye but unable to tell if the shapes are fingers and certainly unable to tell how many fingers are being held up. His remaining exam is non-focal. VSS. Ct head w/o CM obtained and was w/o acute findings. Assessment/Plan: 1. Partial vision loss in left eye: Etiology unclear though concerning for acute Iritis or acute Uveitis associated w/ reactive arthritis, though lack of pain or other objective findings on exam somewhat reassuring. Discussed pt w/ Dr Vonna Kotyk w/ opthalmology service who has agreed to come evaluate pt tonight. Appreciate Dr Florence Canner input. Will continue to monitor closely.  Seth Chang, NP-C [facility] Pager [phone]

## 2013-05-03 NOTE — Consult Note (Signed)
   ORTHOPAEDIC CONSULTATION  REQUESTING PHYSICIAN: Vijaya Akula, MD  Chief Complaint: Left septic knee  HPI: Seth Martinez is a 62 y.o. male who complains of  Increasing pain in the left knee for 2-4days. He has a history of pain in the Right shoulder and hip as well as pain and redness over the right index finger. All but the knee have greatly improved on Abx. All of this started over the last 1-2wks. He was admitted with fever initially. Aspiration performed yesterday with 42,000 White cells.  Past Medical History  Diagnosis Date  . Hyperlipidemia   . Chronic back pain   . NIDDM (non-insulin dependent diabetes mellitus)   . Metabolic syndrome   . Depression   . Insomnia   . Fatigue   . Vertigo   . Joint pain   . Obesity   . Hypertension    Past Surgical History  Procedure Laterality Date  . Rt knee arthroscopic  07/2006  . Fusion lt sacrum with screws  1993  . Fix screws in sacrum  in office  1993  . Back surgery    . Hernia repair     History   Social History  . Marital Status: Married    Spouse Name: N/A    Number of Children: N/A  . Years of Education: N/A   Social History Main Topics  . Smoking status: Never Smoker   . Smokeless tobacco: None  . Alcohol Use: No     Comment: rare  . Drug Use: No  . Sexual Activity: None   Other Topics Concern  . None   Social History Narrative   ** Merged History Encounter **       History reviewed. No pertinent family history. Allergies  Allergen Reactions  . Morphine And Related Anaphylaxis  . Penicillins Anaphylaxis  . Amitriptyline     unk  . Antara [Fenofibrate Micronized]     unk  . Bextra [Valdecoxib]     unk  . Cefdinir     unk  . Celebrex [Celecoxib]     unk  . Crestor [Rosuvastatin Calcium]     unk  . Escitalopram Oxalate     unk  . Fish Oil     unk  . Ibuprofen     unk  . Naproxen Sodium     unk  . Neurontin [Gabapentin]     unk  . Propoxyphene And Methadone     unk  .  Skelaxin     unk  . Ultram [Tramadol]     unk  . Vioxx [Rofecoxib]     unk  . Zocor [Simvastatin]     unk   Prior to Admission medications   Medication Sig Start Date End Date Taking? Authorizing Provider  acetaminophen (TYLENOL) 500 MG tablet Take 500 mg by mouth every 6 (six) hours as needed for pain.   Yes Historical Provider, MD  cyclobenzaprine (FLEXERIL) 10 MG tablet Take 10 mg by mouth daily as needed for muscle spasms.   Yes Historical Provider, MD  dextromethorphan (DELSYM) 30 MG/5ML liquid Take 60 mg by mouth every 12 (twelve) hours as needed for cough.   Yes Historical Provider, MD  ibuprofen (ADVIL,MOTRIN) 200 MG tablet Take 200 mg by mouth every 6 (six) hours as needed for pain.   Yes Historical Provider, MD  oxyCODONE-acetaminophen (PERCOCET/ROXICET) 5-325 MG per tablet Take 1 tablet by mouth every 6 (six) hours as needed for pain. 04/30/13  Yes Alex Nickle, MD    Pseudoeph-Doxylamine-DM-APAP (NYQUIL PO) Take 30 mLs by mouth at bedtime as needed (cold symptoms).   Yes Historical Provider, MD  Pseudoephedrine-APAP-DM (DAYQUIL PO) Take 30 mLs by mouth every 6 (six) hours as needed (cold symptoms).   Yes Historical Provider, MD   Dg Knee 2 Views Left  05/02/2013   CLINICAL DATA:  Joint pain.  EXAM: LEFT KNEE - 1-2 VIEW  COMPARISON:  None.  FINDINGS: There is subcutaneous emphysema in a line along the medial knee, presumably from reported arthrocentesis. Small to moderate knee joint effusion, suprapatellar. No osteolysis. Mild tricompartmental osteoarthritis, without focal or advanced joint narrowing. No fracture, malalignment, or other focal osseous abnormality.  IMPRESSION: 1. Mild to moderate volume knee joint effusion. 2. Mild tricompartmental osteoarthritis. 3. Subcutaneous gas, presumably from reported arthrocentesis.   Electronically Signed   By: Jonathan  Watts M.D.   On: 05/02/2013 00:08   Ct Head Wo Contrast  05/03/2013   CLINICAL DATA:  Losing vision in left eye.  EXAM: CT  HEAD WITHOUT CONTRAST  TECHNIQUE: Contiguous axial images were obtained from the base of the skull through the vertex without intravenous contrast.  COMPARISON:  None.  FINDINGS: Skull:No significant abnormality. Mild thickening of the dorsum sella.  Orbits: No evidence of mass. Symmetric appearing globes.  Brain: No evidence of acute abnormality, such as acute infarction, hemorrhage, hydrocephalus, or mass lesion/mass effect. No evidence of pituitary mass, meningioma, or other causes of left eye vision loss.  IMPRESSION: No acute intracranial findings.  No cause for left eye vision loss.   Electronically Signed   By: Jonathan  Watts M.D.   On: 05/03/2013 02:26   Dg Hand 2 View Right  05/02/2013   CLINICAL DATA:  Pain, no injury  EXAM: RIGHT HAND - 2 VIEW  COMPARISON:  None.  FINDINGS: There is no evidence of fracture or dislocation. There is no evidence of arthropathy or other focal bone abnormality. Mild soft tissue swelling is present  IMPRESSION: No acute osseous abnormality.   Electronically Signed   By: John  Curnes M.D.   On: 05/02/2013 00:09    Positive ROS: All other systems have been reviewed and were otherwise negative with the exception of those mentioned in the HPI and as above.  Labs cbc  Recent Labs  05/02/13 0345 05/03/13 0605  WBC 14.2* 20.1*  HGB 13.2 14.1  HCT 37.6* 42.6  PLT 176 241    Labs inflam  Recent Labs  05/01/13 2215  CRP 36.8*    Labs coag No results found for this basename: INR, PT, PTT,  in the last 72 hours   Recent Labs  05/02/13 0345 05/03/13 0605  NA 135 138  K 3.3* 3.6  CL 99 97  CO2 24 25  GLUCOSE 122* 110*  BUN 18 16  CREATININE 1.20 1.08  CALCIUM 7.8* 8.5    Physical Exam: Filed Vitals:   05/03/13 1652  BP: 151/82  Pulse: 73  Temp: 98.3 F (36.8 C)  Resp: 18   General: Alert, no acute distress Cardiovascular: No pedal edema Respiratory: No cyanosis, no use of accessory musculature GI: No organomegaly, abdomen is soft  and non-tender Skin: No lesions in the area of chief complaint Neurologic: Sensation intact distally Psychiatric: Patient is competent for consent with normal mood and affect Lymphatic: No axillary or cervical lymphadenopathy  MUSCULOSKELETAL:  LLE: sig effusion at L knee. Pain with ROM, some erythema over anterior knee. Distally NVI, no other painful ROM.  RUE: some pain with [place]oulder ROM but   painless small arc motion. No erythema over joint. Able to abduct 70-80 degrees without pain. Receding erythema and swelling over Index finger. No pain with small arc motion of digit joints. No fusiform swelling. No kanaval signs. Other extremities are atraumatic with painless ROM and NVI.  Assessment: Left septic knee  Plan: I performed an injection of Gentamycin (10cc of 1mg/mL solution) into the Left knee using sterile technique after appropriate timeout. I will perform an Arthroscopic I&D tomorrow am, I was asked to wait until Echocardiogram could be performed prior to anasthetic so I elected to perform the Gent injection. This has also been brewing for greater than 48hrs given his history.   The Right shoudler and Finger are improving on IV abx so will continue to observe these with the relatively painless ROM  Will f/u R shoudler MRI ordered by ID. Weight Bearing Status: WBAT all extremities PT VTE px: SCD's and Chemical per primary, please hold morning dose.    MURPHY, TIMOTHY, D, MD Cell [phone]   05/03/2013 5:20 PM     

## 2013-05-03 NOTE — Consult Note (Signed)
Ophthalmology Follow up  Rechecked retina left eye to see if there was any progression.  Vision stable.  On DFE still no vitritis and clear view of retina.  Pt with detachment involving macula.  No breaks or tears seen and multiple whitish lesions in detached area.  No iritis and no hypopyon.  A/P 1.  Retinal detachment involving macula.  No breaks seen and based on new diagnosis of bacteremia could be subretinal abscess causing exudative detachment.  No vitreal involvement seen and no iritis seen.  Retina Specialist will evaluate either today or tomorrow to see if any additional treatment needed.  Pt is on antibiotics for staph bacteremia.   Thank you for allowing e to participate in the care of this patient.  Please feel free to contact me if you have any concerns.    Mia Creek, M.D.

## 2013-05-03 NOTE — Progress Notes (Signed)
  Echocardiogram 2D Echocardiogram has been performed.  Seth Martinez 05/03/2013, 3:57 PM

## 2013-05-03 NOTE — Progress Notes (Signed)
Pt did complained of lost of vision in his left eye and seeing floaters in the right eye, PA on call was notified earlier who came to assess pt, ordered CT of the head, and consulted with the eye doctor to come and see pt. Eye doctor was on the floor to assess pt. Will continue to monitor pt-----

## 2013-05-03 NOTE — Progress Notes (Signed)
Lab called stating that the result for pt's blood cultures were positive. PA on call has been made aware. ---Mackenzye Mackel, rn

## 2013-05-03 NOTE — Progress Notes (Addendum)
ANTIBIOTIC CONSULT NOTE - FOLLOW UP  Pharmacy Consult for ancef Indication: staph aureus bacteremia  Allergies  Allergen Reactions  . Morphine And Related Anaphylaxis  . Penicillins Anaphylaxis  . Amitriptyline     unk  . Antara [Fenofibrate Micronized]     unk  . Bextra [Valdecoxib]     unk  . Cefdinir     unk  . Celebrex [Celecoxib]     unk  . Crestor [Rosuvastatin Calcium]     unk  . Escitalopram Oxalate     unk  . Fish Oil     unk  . Ibuprofen     unk  . Naproxen Sodium     unk  . Neurontin [Gabapentin]     unk  . Propoxyphene And Methadone     unk  . Skelaxin     unk  . Ultram [Tramadol]     unk  . Vioxx [Rofecoxib]     unk  . Zocor [Simvastatin]     unk    Patient Measurements: Height: 5\' 9"  (175.3 cm) Weight: 326 lb 1 oz (147.9 kg) IBW/kg (Calculated) : 70.7  Vital Signs: Temp: 99.9 F (37.7 C) (10/15 0513) Temp src: Oral (10/15 0513) BP: 155/76 mmHg (10/15 0513) Pulse Rate: 87 (10/15 0513) Intake/Output from previous day: 10/14 0701 - 10/15 0700 In: 750 [IV Piggyback:750] Out: 1825 [Urine:1825] Intake/Output from this shift:    Labs:  Recent Labs  04/30/13 1440 05/01/13 2215 05/02/13 0345 05/03/13 0605  WBC 15.4* 16.6* 14.2* 20.1*  HGB 14.9 13.4 13.2 14.1  PLT 185 199 176 241  CREATININE 1.37*  --  1.20 1.08   Estimated Creatinine Clearance: 100.6 ml/min (by C-G formula based on Cr of 1.08). No results found for this basename: VANCOTROUGH, VANCOPEAK, VANCORANDOM, GENTTROUGH, GENTPEAK, GENTRANDOM, TOBRATROUGH, TOBRAPEAK, TOBRARND, AMIKACINPEAK, AMIKACINTROU, AMIKACIN,  in the last 72 hours     Assessment: 62 yo male with staph aureus bacteremia to begin ancef. SCr= 1.08 and CrCl ~ 100. ID following, noted plans for TTE and TEE.  10/15 ancef>> 10/14 doxy>> 10/15 10/14 vanc>>   10/13 synovial fluid- ngtd 10/13 blood x2- staph aureus   Plan:  -Ancef 2gm IV q8h -Will follow renal function, culture ID and clinical  progress  Harland German, Pharm D 05/03/2013 11:43 AM  Addendum  -allergy to PCN noted (reaction stated as anaphylaxis) -Per patient this allergy occurred a long time ago and he is not able to give specific details  Plan -Spoke to Dr. Luciana Axe and will continue vancomycin for now -Will follow culture sensitivities  Harland German, Pharm D 05/03/2013 12:17 PM

## 2013-05-03 NOTE — H&P (Signed)
Seth Martinez is an 62 y.o. male.  Plan: Continue vancomycin  Wait for echo result  Assessment: Staph aureus bacteremia with metastatic infection   HPI  The patient was not present at the time. His wife states that he has had right pelvic/inguinal pain radiating to his back for the past 5 days for which he was admitted to the ED on 10/12. It was severe to the point of limiting his movement during the past couple of days. In addition, his temperature was documented at home as 103 a couple of days ago. He had a flu infection a week and a half ago. His wife states that during his ED visit, he started developing left knee pain right MP joint pain and right shoulder pain. It then progressed to erythema, swelling and more tenderness over both the left knee and ankle. He underwent back surgery in 1993 after which a metal plate was placed. He has no complaints of abdominal pain , shortness of breath or chest pain. The synovial fluid indicated high WBC (42070), predominantly neutrophils.  His temperature is low grade at the moment (99.9) but was high as 103 on 10/13. His WBC today is also high, 20.1. His last blood culture on 10/13 showed  Staph aureus. He develops anaphylaxis on penicillin. He is currently on vancomycin.  Social history: -ve Family history: brother has DM Allergies: morphine, penicillin   Scheduled     Medication Dose/Rate, Route, Frequency Last Action    heparin injection 5,000 Units 5,000 Units, [place], Q8H Given: 10/15 0554    influenza vac split quadrivalent PF (FLUARIX) injection 0.5 mL 0.5 mL, IM, Tomorrow-1000 Ordered    polyethylene glycol (MIRALAX / GLYCOLAX) packet 17 g 17 g, PO, BID Given: 10/15 1047    predniSONE (DELTASONE) tablet 20 mg 20 mg, PO, Q breakfast Given: 10/15 0749    senna (SENOKOT) tablet 17.2 mg 17.2 mg, PO, BID Given: 10/15 0749    vancomycin (VANCOCIN) 1,500 mg in sodium chloride 0.9 % 500 mL IVPB 1,500 mg, IV, Q12H Given: 10/15       Review of  Systems  Constitutional: Positive for fever.  Respiratory: Negative.   Cardiovascular: Negative.   Gastrointestinal: Negative.   Genitourinary: Positive for dysuria.  Musculoskeletal: Positive for back pain and joint pain.  Neurological: Positive for weakness.  left eye decreased vision  Blood pressure 155/76, pulse 87, temperature 99.9 F (37.7 C), temperature source Oral, resp. rate 20, height 5\' 9"  (1.753 m), weight 147.9 kg (326 lb 1 oz), SpO2 95.00%.    Physical Exam  Constitutional: He is oriented to person, place, and time.  Cardiovascular: Normal rate and regular rhythm.   Respiratory: Effort normal and breath sounds normal.  GI: Soft. Bowel sounds are normal.  Neurological: He is alert and oriented to person, place, and time. He has normal reflexes.   left knee: ertythematous, tender, left ankle: swollen, right MP joint: red, swollen, tender  other exams unremarkable    Selected Labs (Up to last 3 results from past 72 hours) Report       10/13 2215   10/14 0345   10/15 0605      WBC 16.6   14.2   20.1      RBC 4.50  4.34  4.80     Hemoglobin 13.4  13.2  14.1     HCT 38.7   37.6   42.6     Platelets 199  176  241     Sodium  135  138     Potassium   3.3   3.6     Chloride   99  97     CO2   24  25     BUN   18  16     Creatinine   1.20  1.08     Calcium   7.8   8.5     Glucose   122   110     Blood culture (routine x 2) [[id number]] Collected: 05/01/13 2215 Updated: 05/03/13 0953 Specimen Type: Blood Specimen Description BLOOD FOREARM LEFT IV START Special Requests BOTTLES DRAWN AEROBIC AND ANAEROBIC 10CC EA Culture Setup Time - Result: 05/02/2013 07:40 Performed at [facility] Culture - Result: STAPHYLOCOCCUS AUREUS Note: RIFAMPIN AND GENTAMICIN SHOULD NOT BE USED AS SINGLE DRUGS FOR TREATMENT OF STAPH INFECTIONS. Note: Gram Stain Report Called to,Read Back By and Verified With: PEACE DORMON ON 05/02/2013 AT 10:06P BY Serafina Mitchell Performed at [facility] Report Status PENDING    Otilio Carpen 05/03/2013, 2:15 PM

## 2013-05-03 NOTE — Progress Notes (Signed)
Pt states he has decrease vision in his lt eye and it feels like he has lost his vision in that eye which is new per pt. PA on call notified.----Zebedee Segundo, rn

## 2013-05-03 NOTE — Consult Note (Signed)
Staph aureus bacteremia  Appears disseminated, joints, possibly eye.    Needs TTE and TEE.  Full consult to follow.  Thanks

## 2013-05-03 NOTE — H&P (Addendum)
[facility]     Reason for Consult:Staph aureus bacteremia    Referring Physician:  Protocol  Principal Problem:   Septic arthritis of multiple joints   . heparin  5,000 Units Subcutaneous Q8H  . influenza vac split quadrivalent PF  0.5 mL Intramuscular Tomorrow-1000  . polyethylene glycol  17 g Oral BID  . predniSONE  20 mg Oral Q breakfast  . senna  2 tablet Oral BID  . vancomycin  1,500 mg Intravenous Q12H    Recommendations: Vancomycin (pcn allergy)  TTE for ? Endocarditis, Will need TEE, concern for emboli with vision changes Orthopedics evaluation for septic arthritis Repeat blood cultures MRI shoulder, back I will d/c prednisone since this was started for non infectious causes with NSAID allergy  Assessment: He has disseminated Staph aureus with localization in knee, shoulder, eye (potentially emboli).  Concern for endocarditis.   I discussed with ophthalmology as well to update Dr. Vonna Kotyk.  Antibiotics: Vancomycin day 2  HPI: Seth Martinez is a 62 y.o. male with recent evaluation in ED for back pain and presented now with knee swelling with elevated WBCs, right shoulder pain, and new onset vision changes.  Has fever, elevated WBC, right index finger pain.  Hip pain but seems related to back.  Over 40,000 WBCs in knee.     Review of Systems: A comprehensive review of systems was negative.  Past Medical History  Diagnosis Date  . Hyperlipidemia   . Chronic back pain   . NIDDM (non-insulin dependent diabetes mellitus)   . Metabolic syndrome   . Depression   . Insomnia   . Fatigue   . Vertigo   . Joint pain   . Obesity   . Hypertension     History  Substance Use Topics  . Smoking status: Never Smoker   . Smokeless tobacco: Not on file  . Alcohol Use: No     Comment: rare    History reviewed. No pertinent family history. Allergies  Allergen Reactions  . Morphine And Related Anaphylaxis  . Penicillins Anaphylaxis   . Amitriptyline     unk  . Antara [Fenofibrate Micronized]     unk  . Bextra [Valdecoxib]     unk  . Cefdinir     unk  . Celebrex [Celecoxib]     unk  . Crestor [Rosuvastatin Calcium]     unk  . Escitalopram Oxalate     unk  . Fish Oil     unk  . Ibuprofen     unk  . Naproxen Sodium     unk  . Neurontin [Gabapentin]     unk  . Propoxyphene And Methadone     unk  . Skelaxin     unk  . Ultram [Tramadol]     unk  . Vioxx [Rofecoxib]     unk  . Zocor [Simvastatin]     unk    OBJECTIVE: Blood pressure 155/76, pulse 87, temperature 99.9 F (37.7 C), temperature source Oral, resp. rate 20, height 5\' 9"  (1.753 m), weight 326 lb 1 oz (147.9 kg), SpO2 95.00%. General: unable to examine, patient getting echocardiogram  Microbiology: Recent Results (from the past 240 hour(s))  CULTURE, BLOOD (ROUTINE X 2)     Status: None   Collection Time    05/01/13 10:15 PM      Result Value Range Status   Specimen Description BLOOD FOREARM LEFT IV START   Final   Special Requests BOTTLES  DRAWN AEROBIC AND ANAEROBIC 10CC EA   Final   Culture  Setup Time     Final   Value: 05/02/2013 07:40     Performed at [facility]   Culture     Final   Value: STAPHYLOCOCCUS AUREUS     Note: RIFAMPIN AND GENTAMICIN SHOULD NOT BE USED AS SINGLE DRUGS FOR TREATMENT OF STAPH INFECTIONS.     Note: Gram Stain Report Called to,Read Back By and Verified With: PEACE DORMON ON 05/02/2013 AT 10:06P BY [email or web address]     Performed at [facility]   Report Status PENDING   Incomplete  GRAM STAIN     Status: None   Collection Time    05/01/13 10:28 PM      Result Value Range Status   Specimen Description SYNOVIAL FLUID KNEE LEFT   Final   Special Requests Normal   Final   Gram Stain     Final   Value: ABUNDANT WBC PRESENT,BOTH PMN AND MONONUCLEAR     NO ORGANISMS SEEN   Report Status 05/01/2013 FINAL   Final  BODY FLUID CULTURE     Status: None   Collection Time    05/01/13 10:28 PM       Result Value Range Status   Specimen Description SYNOVIAL FLUID KNEE LEFT   Final   Special Requests Normal   Final   Gram Stain     Final   Value: ABUNDANT WBC PRESENT,BOTH PMN AND MONONUCLEAR     NO ORGANISMS SEEN     Performed at [facility]     Performed at [facility]   Culture PENDING   Incomplete   Report Status PENDING   Incomplete  CULTURE, BLOOD (ROUTINE X 2)     Status: None   Collection Time    05/01/13 11:48 PM      Result Value Range Status   Specimen Description BLOOD LEFT HAND   Final   Special Requests BOTTLES DRAWN AEROBIC ONLY 8CC   Final   Culture  Setup Time     Final   Value: 05/02/2013 07:39     Performed at [facility]   Culture     Final   Value: STAPHYLOCOCCUS AUREUS     Note: Gram Stain Report Called to,Read Back By and Verified With: PEACE DORMON ON 05/02/2013 AT 10:06P BY [employer]     Performed at [facility]   Report Status PENDING   Incomplete    Jaslynn Thome, Molly Maduro, MD [email or web address] [phone] pager  [phone] cell 05/03/2013, 3:33 PM

## 2013-05-04 ENCOUNTER — Encounter (HOSPITAL_COMMUNITY): Payer: Self-pay | Admitting: Anesthesiology

## 2013-05-04 ENCOUNTER — Encounter (HOSPITAL_COMMUNITY): Payer: Medicare Other | Admitting: Anesthesiology

## 2013-05-04 ENCOUNTER — Encounter (HOSPITAL_COMMUNITY)
Admission: EM | Disposition: A | Discharge status: Home Health | Payer: Self-pay | Source: Home / Self Care | Attending: Internal Medicine

## 2013-05-04 ENCOUNTER — Inpatient Hospital Stay (HOSPITAL_COMMUNITY): Payer: Medicare Other | Admitting: Anesthesiology

## 2013-05-04 DIAGNOSIS — I059 Rheumatic mitral valve disease, unspecified: Secondary | ICD-10-CM

## 2013-05-04 DIAGNOSIS — A4101 Sepsis due to Methicillin susceptible Staphylococcus aureus: Secondary | ICD-10-CM

## 2013-05-04 DIAGNOSIS — I10 Essential (primary) hypertension: Secondary | ICD-10-CM

## 2013-05-04 HISTORY — PX: TEE WITHOUT CARDIOVERSION: SHX5443

## 2013-05-04 HISTORY — PX: KNEE ARTHROSCOPY: SHX127

## 2013-05-04 LAB — CULTURE, BLOOD (ROUTINE X 2)

## 2013-05-04 LAB — BODY FLUID CULTURE: Special Requests: NORMAL

## 2013-05-04 LAB — TYPE AND SCREEN
DAT, IgG: NEGATIVE
PT AG Type: NEGATIVE

## 2013-05-04 LAB — B. BURGDORFI ANTIBODIES: B burgdorferi Ab IgG+IgM: 0.24 {ISR}

## 2013-05-04 SURGERY — ECHOCARDIOGRAM, TRANSESOPHAGEAL
Anesthesia: Moderate Sedation

## 2013-05-04 SURGERY — ARTHROSCOPY, KNEE
Anesthesia: General | Site: Knee | Laterality: Left | Wound class: Dirty or Infected

## 2013-05-04 MED ORDER — HYDRALAZINE HCL 20 MG/ML IJ SOLN
5.0000 mg | Freq: Three times a day (TID) | INTRAMUSCULAR | Status: DC | PRN
  Administered 2013-05-07 (×2): 5 mg via INTRAVENOUS
  Filled 2013-05-04 (×2): qty 1

## 2013-05-04 MED ORDER — SODIUM CHLORIDE 0.9 % IJ SOLN
INTRAMUSCULAR | Status: AC
  Filled 2013-05-04: qty 12

## 2013-05-04 MED ORDER — FENTANYL CITRATE 0.05 MG/ML IJ SOLN
INTRAMUSCULAR | Status: DC | PRN
  Administered 2013-05-04: 25 ug via INTRAVENOUS

## 2013-05-04 MED ORDER — GENTAMICIN SULFATE 40 MG/ML IJ SOLN
INTRAMUSCULAR | Status: AC
  Filled 2013-05-04: qty 2

## 2013-05-04 MED ORDER — SODIUM CHLORIDE 0.9 % IV SOLN
INTRAVENOUS | Status: DC
  Administered 2013-05-04 – 2013-05-06 (×3): via INTRAVENOUS

## 2013-05-04 MED ORDER — FENTANYL CITRATE 0.05 MG/ML IJ SOLN
INTRAMUSCULAR | Status: AC
  Filled 2013-05-04: qty 2

## 2013-05-04 MED ORDER — CHLORHEXIDINE GLUCONATE CLOTH 2 % EX PADS
6.0000 | MEDICATED_PAD | Freq: Every day | CUTANEOUS | Status: AC
  Administered 2013-05-04 – 2013-05-08 (×4): 6 via TOPICAL

## 2013-05-04 MED ORDER — FENTANYL CITRATE 0.05 MG/ML IJ SOLN
INTRAMUSCULAR | Status: DC | PRN
  Administered 2013-05-04 (×2): 50 ug via INTRAVENOUS
  Administered 2013-05-04: 100 ug via INTRAVENOUS

## 2013-05-04 MED ORDER — LIDOCAINE HCL (CARDIAC) 20 MG/ML IV SOLN
INTRAVENOUS | Status: DC | PRN
  Administered 2013-05-04: 100 mg via INTRAVENOUS

## 2013-05-04 MED ORDER — METOPROLOL TARTRATE 1 MG/ML IV SOLN
INTRAVENOUS | Status: AC
  Filled 2013-05-04: qty 10

## 2013-05-04 MED ORDER — METOPROLOL TARTRATE 1 MG/ML IV SOLN
INTRAVENOUS | Status: DC | PRN
  Administered 2013-05-04: 5 mg via INTRAVENOUS

## 2013-05-04 MED ORDER — ONDANSETRON HCL 4 MG/2ML IJ SOLN
INTRAMUSCULAR | Status: DC | PRN
  Administered 2013-05-04: 4 mg via INTRAMUSCULAR

## 2013-05-04 MED ORDER — HYDROMORPHONE HCL PF 1 MG/ML IJ SOLN: 0.2500 mg | INTRAMUSCULAR | Status: DC | PRN

## 2013-05-04 MED ORDER — BUPIVACAINE-EPINEPHRINE (PF) 0.5% -1:200000 IJ SOLN
INTRAMUSCULAR | Status: AC
  Filled 2013-05-04: qty 10

## 2013-05-04 MED ORDER — ARTIFICIAL TEARS OP OINT
TOPICAL_OINTMENT | OPHTHALMIC | Status: DC | PRN
  Administered 2013-05-04: 1 via OPHTHALMIC

## 2013-05-04 MED ORDER — METHYLPREDNISOLONE ACETATE 40 MG/ML IJ SUSP
INTRAMUSCULAR | Status: AC
  Filled 2013-05-04: qty 1

## 2013-05-04 MED ORDER — ONDANSETRON HCL 4 MG/2ML IJ SOLN: 4.0000 mg | Freq: Once | INTRAMUSCULAR | Status: DC | PRN

## 2013-05-04 MED ORDER — GENTAMICIN SULFATE 40 MG/ML IJ SOLN
INTRAMUSCULAR | Status: DC | PRN
  Administered 2013-05-04: 40 mg via INTRAMUSCULAR

## 2013-05-04 MED ORDER — METHYLPREDNISOLONE ACETATE 40 MG/ML IJ SUSP
INTRAMUSCULAR | Status: DC | PRN
  Administered 2013-05-04: 1 mL

## 2013-05-04 MED ORDER — LABETALOL HCL 5 MG/ML IV SOLN
INTRAVENOUS | Status: DC | PRN
  Administered 2013-05-04: 10 mg via INTRAVENOUS

## 2013-05-04 MED ORDER — VANCOMYCIN HCL 10 G IV SOLR
1500.0000 mg | Freq: Two times a day (BID) | INTRAVENOUS | Status: DC
  Administered 2013-05-04 – 2013-05-06 (×6): 1500 mg via INTRAVENOUS
  Filled 2013-05-04 (×7): qty 1500

## 2013-05-04 MED ORDER — BUPIVACAINE-EPINEPHRINE 0.5% -1:200000 IJ SOLN
INTRAMUSCULAR | Status: DC | PRN
  Administered 2013-05-04: 4 mL

## 2013-05-04 MED ORDER — PROPOFOL 10 MG/ML IV BOLUS
INTRAVENOUS | Status: DC | PRN
  Administered 2013-05-04: 400 mg via INTRAVENOUS

## 2013-05-04 MED ORDER — STERILE WATER FOR IRRIGATION IR SOLN
Status: DC | PRN
  Administered 2013-05-04: 1000 mL

## 2013-05-04 MED ORDER — LACTATED RINGERS IV SOLN
INTRAVENOUS | Status: DC | PRN
  Administered 2013-05-04: 07:00:00 via INTRAVENOUS

## 2013-05-04 MED ORDER — MIDAZOLAM HCL 10 MG/2ML IJ SOLN
INTRAMUSCULAR | Status: DC | PRN
  Administered 2013-05-04: 1 mg via INTRAVENOUS

## 2013-05-04 MED ORDER — MIDAZOLAM HCL 5 MG/ML IJ SOLN
INTRAMUSCULAR | Status: AC
  Filled 2013-05-04: qty 2

## 2013-05-04 MED ORDER — BUTAMBEN-TETRACAINE-BENZOCAINE 2-2-14 % EX AERO
INHALATION_SPRAY | CUTANEOUS | Status: DC | PRN
  Administered 2013-05-04: 2 via TOPICAL

## 2013-05-04 MED ORDER — MUPIROCIN 2 % EX OINT
1.0000 "application " | TOPICAL_OINTMENT | Freq: Two times a day (BID) | CUTANEOUS | Status: AC
  Administered 2013-05-04 – 2013-05-08 (×8): 1 via NASAL
  Filled 2013-05-04: qty 22

## 2013-05-04 MED ORDER — SODIUM CHLORIDE 0.9 % IJ SOLN
INTRAMUSCULAR | Status: DC | PRN
  Administered 2013-05-04: 40 mL via INTRAVENOUS

## 2013-05-04 MED ORDER — SODIUM CHLORIDE 0.9 % IR SOLN
Status: DC | PRN
  Administered 2013-05-04 (×3): 3000 mL

## 2013-05-04 MED ORDER — PHENYLEPHRINE HCL 10 MG/ML IJ SOLN
INTRAMUSCULAR | Status: DC | PRN
  Administered 2013-05-04: 120 ug via INTRAVENOUS

## 2013-05-04 MED ORDER — SUCCINYLCHOLINE CHLORIDE 20 MG/ML IJ SOLN
INTRAMUSCULAR | Status: DC | PRN
  Administered 2013-05-04: 200 mg via INTRAVENOUS

## 2013-05-04 SURGICAL SUPPLY — 56 items
BANDAGE ELASTIC 6 VELCRO ST LF (GAUZE/BANDAGES/DRESSINGS) ×1 IMPLANT
BANDAGE ESMARK 6X9 LF (GAUZE/BANDAGES/DRESSINGS) IMPLANT
BANDAGE GAUZE ELAST BULKY 4 IN (GAUZE/BANDAGES/DRESSINGS) ×1 IMPLANT
BLADE GREAT WHITE 4.2 (BLADE) ×2 IMPLANT
BLADE SURG 11 STRL SS (BLADE) IMPLANT
BLADE SURG ROTATE 9660 (MISCELLANEOUS) IMPLANT
BNDG CMPR 9X6 STRL LF SNTH (GAUZE/BANDAGES/DRESSINGS)
BNDG ESMARK 6X9 LF (GAUZE/BANDAGES/DRESSINGS)
BOOTCOVER CLEANROOM LRG (PROTECTIVE WEAR) ×4 IMPLANT
CLOTH BEACON ORANGE TIMEOUT ST (SAFETY) ×2 IMPLANT
COVER SURGICAL LIGHT HANDLE (MISCELLANEOUS) ×2 IMPLANT
CUFF TOURNIQUET SINGLE 34IN LL (TOURNIQUET CUFF) IMPLANT
CUTTER MENISCUS 3.5MM 6/BX (BLADE) ×2 IMPLANT
DRAPE ARTHROSCOPY W/POUCH 114 (DRAPES) ×2 IMPLANT
DRAPE U-SHAPE 47X51 STRL (DRAPES) ×2 IMPLANT
DRSG PAD ABDOMINAL 8X10 ST (GAUZE/BANDAGES/DRESSINGS) ×1 IMPLANT
FACESHIELD LNG OPTICON STERILE (SAFETY) ×2 IMPLANT
GAUZE XEROFORM 1X8 LF (GAUZE/BANDAGES/DRESSINGS) ×1 IMPLANT
GLOVE BIO SURGEON STRL SZ7 (GLOVE) ×2 IMPLANT
GLOVE BIO SURGEON STRL SZ7.5 (GLOVE) ×1 IMPLANT
GLOVE BIO SURGEON STRL SZ8 (GLOVE) ×4 IMPLANT
GLOVE BIOGEL PI IND STRL 7.5 (GLOVE) IMPLANT
GLOVE BIOGEL PI IND STRL 8 (GLOVE) ×1 IMPLANT
GLOVE BIOGEL PI IND STRL 8.5 (GLOVE) ×1 IMPLANT
GLOVE BIOGEL PI INDICATOR 7.5 (GLOVE) ×2
GLOVE BIOGEL PI INDICATOR 8 (GLOVE) ×2
GLOVE BIOGEL PI INDICATOR 8.5 (GLOVE) ×1
GLOVE ORTHO TXT STRL SZ7.5 (GLOVE) ×4 IMPLANT
GOWN PREVENTION PLUS XLARGE (GOWN DISPOSABLE) ×2 IMPLANT
GOWN PREVENTION PLUS XXLARGE (GOWN DISPOSABLE) ×2 IMPLANT
GOWN STRL NON-REIN LRG LVL3 (GOWN DISPOSABLE) ×4 IMPLANT
KIT ROOM TURNOVER OR (KITS) ×2 IMPLANT
MANIFOLD NEPTUNE II (INSTRUMENTS) IMPLANT
NDL 18GX1X1/2 (RX/OR ONLY) (NEEDLE) IMPLANT
NDL SPNL 18GX3.5 QUINCKE PK (NEEDLE) IMPLANT
NEEDLE 18GX1X1/2 (RX/OR ONLY) (NEEDLE) ×4 IMPLANT
NEEDLE 22X1 1/2 (OR ONLY) (NEEDLE) ×2 IMPLANT
NEEDLE SPNL 18GX3.5 QUINCKE PK (NEEDLE) IMPLANT
NS IRRIG 1000ML POUR BTL (IV SOLUTION) IMPLANT
PACK ARTHROSCOPY DSU (CUSTOM PROCEDURE TRAY) ×2 IMPLANT
PAD ARMBOARD 7.5X6 YLW CONV (MISCELLANEOUS) ×4 IMPLANT
PADDING CAST COTTON 6X4 STRL (CAST SUPPLIES) IMPLANT
SET ARTHROSCOPY TUBING (MISCELLANEOUS) ×2
SET ARTHROSCOPY TUBING LN (MISCELLANEOUS) ×1 IMPLANT
SPONGE GAUZE 4X4 12PLY (GAUZE/BANDAGES/DRESSINGS) ×1 IMPLANT
SPONGE LAP 4X18 X RAY DECT (DISPOSABLE) ×2 IMPLANT
SUT ETHILON 2 0 FS 18 (SUTURE) IMPLANT
SUT ETHILON 3 0 PS 1 (SUTURE) IMPLANT
SYR 20ML ECCENTRIC (SYRINGE) IMPLANT
SYR 3ML LL SCALE MARK (SYRINGE) ×1 IMPLANT
SYR 5ML LL (SYRINGE) ×1 IMPLANT
SYR CONTROL 10ML LL (SYRINGE) IMPLANT
TOWEL OR 17X24 6PK STRL BLUE (TOWEL DISPOSABLE) ×2 IMPLANT
TOWEL OR 17X26 10 PK STRL BLUE (TOWEL DISPOSABLE) ×2 IMPLANT
TUBE CONNECTING 12X1/4 (SUCTIONS) ×2 IMPLANT
WATER STERILE IRR 1000ML POUR (IV SOLUTION) ×2 IMPLANT

## 2013-05-04 NOTE — Anesthesia Postprocedure Evaluation (Signed)
  Anesthesia Post-op Note  Patient: Seth Martinez  Procedure(s) Performed: Procedure(s): ARTHROSCOPY KNEE WITH WASHOUT (Left)  Patient Location: PACU  Anesthesia Type:General  Level of Consciousness: awake, alert , oriented and patient cooperative  Airway and Oxygen Therapy: Patient Spontanous Breathing  Post-op Pain: moderate  Post-op Assessment: Post-op Vital signs reviewed, Patient's Cardiovascular Status Stable, Respiratory Function Stable, Patent Airway, No signs of Nausea or vomiting and Pain level controlled  Post-op Vital Signs: stable  Complications: No apparent anesthesia complications

## 2013-05-04 NOTE — Progress Notes (Signed)
PATIENT DETAILS Name: Seth Martinez Age: 62 y.o. Sex: male Date of Birth: 1950/10/13 Admit Date: 05/01/2013 Admitting Physician Hillary Bow, DO UJW:JXBJY, Dorinda Hill, MD  Subjective: Admitted with fever, cough cold-for 1.5 weeks, then started having left knee pain swelling, right shoulder pain, right hip pain and right index finger pain/swelling.   Assessment/Plan: Principal Problem: Polyarticular Arthritis/ septic arthritis:  -following URI symptoms-did have one day of diarrhea. - Multiple joint pains associated with redness and swelling. He underwent arthrocentesis. The analysis showed Synovial fluid WBC 42070, gram stain of synovial fluid neg, synovial cx  Negative so far. . - Blood cultures show MRSA.  -will continue with IV Vanco for now, will need prolonged course.  -monitor and follow clinical course closely. - cardiology called for TEE  to evaluate for endocarditis     left eye vision loss: Patient complained of sudden vision in the left eye . Ophthalmology consulted, followed by retina Specialist show vitritis possibly from the disseminated staph infection.  CT head did nto show any acute stroke.   Hypertension: Prn hydralazine.   DVT prophylaxis   Chronic Back pain -c/w supportive care  Disposition: Remain inpatient  DVT Prophylaxis: Prophylactic Heparin  Code Status: Full code   Family Communication -spouse at bedside  Procedures:  Arthrocentesis 10/13  CONSULTS: ID OPHTHALMOLOGY Orthopedics cardiology   MEDICATIONS: Scheduled Meds: . [MAR HOLD] Chlorhexidine Gluconate Cloth  6 each Topical Q0600  . [facility] HOLD] gentamicin  40 mg Intramuscular Once  . [MAR HOLD] heparin  5,000 Units Subcutaneous Q8H  . [facility] HOLD] mupirocin ointment  1 application Nasal BID  . [MAR HOLD] polyethylene glycol  17 g Oral BID  . [MAR HOLD] senna  2 tablet Oral BID  . [facility] HOLD] vancomycin  1,500 mg Intravenous Q12H   Continuous Infusions: . sodium  chloride     PRN Meds:.[MAR HOLD] acetaminophen, [MAR HOLD] cyclobenzaprine, hydrALAZINE, [MAR HOLD] oxyCODONE-acetaminophen  Antibiotics: Anti-infectives   Start     Dose/Rate Route Frequency Ordered Stop   05/04/13 1100  [MAR Hold]  vancomycin (VANCOCIN) 1,500 mg in sodium chloride 0.9 % 500 mL IVPB     (On MAR Hold since 05/04/13 1438)   1,500 mg 250 mL/hr over 120 Minutes Intravenous Every 12 hours 05/04/13 1048     05/04/13 0750  gentamicin (GARAMYCIN) injection  Status:  Discontinued       As needed 05/04/13 0750 05/04/13 0831   05/04/13 0600  clindamycin (CLEOCIN) IVPB 900 mg     900 mg 100 mL/hr over 30 Minutes Intravenous On call to O.R. 05/03/13 1958 05/04/13 0728   05/03/13 1630  [MAR Hold]  gentamicin (GARAMYCIN) injection 40 mg     (On MAR Hold since 05/04/13 1438)   40 mg Intramuscular  Once 05/03/13 1608     05/02/13 1000  vancomycin (VANCOCIN) 1,500 mg in sodium chloride 0.9 % 500 mL IVPB  Status:  Discontinued     1,500 mg 250 mL/hr over 120 Minutes Intravenous Every 12 hours 05/02/13 0233 05/04/13 1048   05/02/13 0200  doxycycline (VIBRAMYCIN) 100 mg in dextrose 5 % 250 mL IVPB  Status:  Discontinued     100 mg 125 mL/hr over 120 Minutes Intravenous Every 12 hours 05/02/13 0148 05/03/13 1111   05/02/13 0000  vancomycin (VANCOCIN) 2,500 mg in sodium chloride 0.9 % 500 mL IVPB     2,500 mg 250 mL/hr over 120 Minutes Intravenous  Once 05/01/13 2345 05/02/13 0212       PHYSICAL  EXAM: Vital signs in last 24 hours: Filed Vitals:   05/04/13 0900 05/04/13 0915 05/04/13 0930 05/04/13 1447  BP: 154/94 171/87 128/76 210/95  Pulse: 87 82 87 97  Temp:  98.1 F (36.7 C) 97.7 F (36.5 C) 99.3 F (37.4 C)  TempSrc:   Oral Oral  Resp: 21 19 18 25   Height:      Weight:      SpO2: 95% 93% 93% 92%    Weight change:  Filed Weights   05/01/13 2116 05/02/13 0102  Weight: 145.605 kg (321 lb) 147.9 kg (326 lb 1 oz)   Body mass index is 48.13 kg/(m^2).   Gen Exam:  sleeping comfortably Chest: B/L Clear.  CVS: S1 S2 Regular, no murmurs.  Abdomen: soft, BS +, non tender, non distended.  Extremities: no edema, lower extremities warm to touch. Left Knee swollen/erythematous/tender, right groin tender, right shoulder pain, right index finger-proximal part swollen and tender/red(?sausafe digit)left ankle swollen.  Neurologic: Non Focal.   Skin: No Rash.    Intake/Output from previous day:  Intake/Output Summary (Last 24 hours) at 05/04/13 1529 Last data filed at 05/04/13 0830  Gross per 24 hour  Intake    740 ml  Output    650 ml  Net     90 ml     LAB RESULTS: CBC  Recent Labs Lab 04/30/13 1440 05/01/13 2215 05/02/13 0345 05/03/13 0605  WBC 15.4* 16.6* 14.2* 20.1*  HGB 14.9 13.4 13.2 14.1  HCT 42.5 38.7* 37.6* 42.6  PLT 185 199 176 241  MCV 86.4 86.0 86.6 88.8  MCH 30.3 29.8 30.4 29.4  MCHC 35.1 34.6 35.1 33.1  RDW 13.5 13.8 13.8 14.3  LYMPHSABS 0.7 0.9  --   --   MONOABS 1.6* 2.0*  --   --   EOSABS 0.0 0.0  --   --   BASOSABS 0.1 0.0  --   --     Chemistries   Recent Labs Lab 04/30/13 1440 05/02/13 0345 05/03/13 0605  NA 134* 135 138  K 4.0 3.3* 3.6  CL 96 99 97  CO2 21 24 25   GLUCOSE 137* 122* 110*  BUN 16 18 16   CREATININE 1.37* 1.20 1.08  CALCIUM 8.9 7.8* 8.5    CBG:  Recent Labs Lab 05/02/13 0039 05/04/13 0519 05/04/13 0841  GLUCAP 164* 152* 118*    GFR Estimated Creatinine Clearance: 100.6 ml/min (by C-G formula based on Cr of 1.08).  Coagulation profile No results found for this basename: INR, PROTIME,  in the last 168 hours  Cardiac Enzymes No results found for this basename: CK, CKMB, TROPONINI, MYOGLOBIN,  in the last 168 hours  No components found with this basename: POCBNP,  No results found for this basename: DDIMER,  in the last 72 hours No results found for this basename: HGBA1C,  in the last 72 hours No results found for this basename: CHOL, HDL, LDLCALC, TRIG, CHOLHDL, LDLDIRECT,  in  the last 72 hours No results found for this basename: TSH, T4TOTAL, FREET3, T3FREE, THYROIDAB,  in the last 72 hours No results found for this basename: VITAMINB12, FOLATE, FERRITIN, TIBC, IRON, RETICCTPCT,  in the last 72 hours No results found for this basename: LIPASE, AMYLASE,  in the last 72 hours  Urine Studies No results found for this basename: UACOL, UAPR, USPG, UPH, UTP, UGL, UKET, UBIL, UHGB, UNIT, UROB, ULEU, UEPI, UWBC, URBC, UBAC, CAST, CRYS, UCOM, BILUA,  in the last 72 hours  MICROBIOLOGY: Recent Results (from the  past 240 hour(s))  CULTURE, BLOOD (ROUTINE X 2)     Status: None   Collection Time    05/01/13 10:15 PM      Result Value Range Status   Specimen Description BLOOD FOREARM LEFT IV START   Final   Special Requests BOTTLES DRAWN AEROBIC AND ANAEROBIC 10CC EA   Final   Culture  Setup Time     Final   Value: 05/02/2013 07:40     Performed at [facility]   Culture     Final   Value: METHICILLIN RESISTANT STAPHYLOCOCCUS AUREUS     Note: RIFAMPIN AND GENTAMICIN SHOULD NOT BE USED AS SINGLE DRUGS FOR TREATMENT OF STAPH INFECTIONS. CRITICAL RESULT CALLED TO, READ BACK BY AND VERIFIED WITH: COURTNEY D @1421  05/04/13 BY KRAWS     Note: Gram Stain Report Called to,Read Back By and Verified With: PEACE DORMON ON 05/02/2013 AT 10:06P BY WILEJ     Performed at [facility]   Report Status 05/04/2013 FINAL   Final   Organism ID, Bacteria METHICILLIN RESISTANT STAPHYLOCOCCUS AUREUS   Final  GRAM STAIN     Status: None   Collection Time    05/01/13 10:28 PM      Result Value Range Status   Specimen Description SYNOVIAL FLUID KNEE LEFT   Final   Special Requests Normal   Final   Gram Stain     Final   Value: ABUNDANT WBC PRESENT,BOTH PMN AND MONONUCLEAR     NO ORGANISMS SEEN   Report Status 05/01/2013 FINAL   Final  BODY FLUID CULTURE     Status: None   Collection Time    05/01/13 10:28 PM      Result Value Range Status   Specimen Description SYNOVIAL  FLUID KNEE LEFT   Final   Special Requests Normal   Final   Gram Stain     Final   Value: ABUNDANT WBC PRESENT,BOTH PMN AND MONONUCLEAR     NO ORGANISMS SEEN     Performed at [facility]     Performed at [facility]   Culture     Final   Value: MODERATE METHICILLIN RESISTANT STAPHYLOCOCCUS AUREUS     Note: CRITICAL RESULT CALLED TO, READ BACK BY AND VERIFIED WITH: COURTNEY D @1421  05/04/13 BY KRAWS     Performed at [facility]   Report Status 05/04/2013 FINAL   Final   Organism ID, Bacteria METHICILLIN RESISTANT STAPHYLOCOCCUS AUREUS   Final  CULTURE, BLOOD (ROUTINE X 2)     Status: None   Collection Time    05/01/13 11:48 PM      Result Value Range Status   Specimen Description BLOOD LEFT HAND   Final   Special Requests BOTTLES DRAWN AEROBIC ONLY 8CC   Final   Culture  Setup Time     Final   Value: 05/02/2013 07:39     Performed at [facility]   Culture     Final   Value: STAPHYLOCOCCUS AUREUS     Note: SUSCEPTIBILITIES PERFORMED ON PREVIOUS CULTURE WITHIN THE LAST 5 DAYS.     Note: Gram Stain Report Called to,Read Back By and Verified With: PEACE DORMON ON 05/02/2013 AT 10:06P BY Serafina Mitchell     Performed at [facility]   Report Status 05/04/2013 FINAL   Final  CULTURE, BLOOD (ROUTINE X 2)     Status: None   Collection Time    05/03/13 12:33 PM  Result Value Range Status   Specimen Description BLOOD RIGHT ARM   Final   Special Requests BOTTLES DRAWN AEROBIC AND ANAEROBIC 5CC   Final   Culture  Setup Time     Final   Value: 05/03/2013 17:05     Performed at [facility]   Culture     Final   Value:        BLOOD CULTURE RECEIVED NO GROWTH TO DATE CULTURE WILL BE HELD FOR 5 DAYS BEFORE ISSUING A FINAL NEGATIVE REPORT     Performed at [facility]   Report Status PENDING   Incomplete  CULTURE, BLOOD (ROUTINE X 2)     Status: None   Collection Time    05/03/13 12:40 PM      Result Value Range Status   Specimen  Description BLOOD LEFT HAND   Final   Special Requests BOTTLES DRAWN AEROBIC AND ANAEROBIC 5CC   Final   Culture  Setup Time     Final   Value: 05/03/2013 17:05     Performed at [facility]   Culture     Final   Value:        BLOOD CULTURE RECEIVED NO GROWTH TO DATE CULTURE WILL BE HELD FOR 5 DAYS BEFORE ISSUING A FINAL NEGATIVE REPORT     Performed at [facility]   Report Status PENDING   Incomplete  SURGICAL PCR SCREEN     Status: Abnormal   Collection Time    05/03/13  5:13 PM      Result Value Range Status   MRSA, PCR POSITIVE (*) NEGATIVE Final   Staphylococcus aureus POSITIVE (*) NEGATIVE Final   Comment:            The Xpert SA Assay (FDA     approved for NASAL specimens     in patients over 1 years of age),     is one component of     a comprehensive surveillance     program.  Test performance has     been validated by [employer] for patients greater     than or equal to 47 year old.     It is not intended     to diagnose infection nor to     guide or monitor treatment.    RADIOLOGY STUDIES/RESULTS: Dg Chest 2 View  04/30/2013   CLINICAL DATA:  Back pain and cough.  EXAM: CHEST  2 VIEW  COMPARISON:  No priors.  FINDINGS: Lung volumes are normal. No consolidative airspace disease. No pleural effusions. No pneumothorax. No pulmonary nodule or mass noted. Pulmonary vasculature and the cardiomediastinal silhouette are within normal limits. Old healed fracture of the distal right clavicle incidentally noted.  IMPRESSION: 1.  No radiographic evidence of acute cardiopulmonary disease.   Electronically Signed   By: Trudie Reed M.D.   On: 04/30/2013 15:22   Dg Lumbar Spine 2-3 Views  04/30/2013   CLINICAL DATA:  Back pain.  EXAM: LUMBAR SPINE - 2-3 VIEW  COMPARISON:  None.  FINDINGS: There is marked convex right scoliosis. Multilevel degenerative change is present. The patient is status post L4-S1 fusion. Hardware appears intact. No fracture is  identified.  IMPRESSION: No acute finding.  Multilevel degenerative change. Status post L4-S1 fusion.   Electronically Signed   By: Drusilla Kanner M.D.   On: 04/30/2013 16:56   Dg Hip Complete Right  04/30/2013   CLINICAL DATA:  Low back  pain and right-sided hip pain.  EXAM: RIGHT HIP - COMPLETE 2+ VIEW  COMPARISON:  No priors.  FINDINGS: AP view of the pelvis and AP and lateral views of the right hip demonstrate no definite acute displaced fracture, subluxation, dislocation, joint or soft tissue abnormality. Orthopedic fixation hardware is noted at the lumbosacral junction, and the fixation screw in the low was position on the left appears fractured. No prior studies are available for comparison.  IMPRESSION: 1. No acute radiographic abnormality of the bony pelvis or the left hip. 2. Fracture of the inferior fixation screw on the left side in this patient status post PLIF at L4-S1. Whether or not this is an acute finding or has been present on prior outside studies is uncertain. Correlation with prior outside examinations is suggested.   Electronically Signed   By: Trudie Reed M.D.   On: 04/30/2013 16:50   Dg Knee 2 Views Left  05/02/2013   CLINICAL DATA:  Joint pain.  EXAM: LEFT KNEE - 1-2 VIEW  COMPARISON:  None.  FINDINGS: There is subcutaneous emphysema in a line along the medial knee, presumably from reported arthrocentesis. Small to moderate knee joint effusion, suprapatellar. No osteolysis. Mild tricompartmental osteoarthritis, without focal or advanced joint narrowing. No fracture, malalignment, or other focal osseous abnormality.  IMPRESSION: 1. Mild to moderate volume knee joint effusion. 2. Mild tricompartmental osteoarthritis. 3. Subcutaneous gas, presumably from reported arthrocentesis.   Electronically Signed   By: Tiburcio Pea M.D.   On: 05/02/2013 00:08   Dg Hand 2 View Right  05/02/2013   CLINICAL DATA:  Pain, no injury  EXAM: RIGHT HAND - 2 VIEW  COMPARISON:  None.  FINDINGS:  There is no evidence of fracture or dislocation. There is no evidence of arthropathy or other focal bone abnormality. Mild soft tissue swelling is present  IMPRESSION: No acute osseous abnormality.   Electronically Signed   By: Davonna Belling M.D.   On: 05/02/2013 00:09    Seth Crowson, MD  [facility] Pager:[phone]  If 7PM-7AM, please contact night-coverage [email or web address] Password [email or web address] 05/04/2013, 3:29 PM   LOS: 3 days

## 2013-05-04 NOTE — Transfer of Care (Signed)
Immediate Anesthesia Transfer of Care Note  Patient: Seth Martinez  Procedure(s) Performed: Procedure(s): ARTHROSCOPY KNEE WITH WASHOUT (Left)  Patient Location: PACU  Anesthesia Type:General  Level of Consciousness: awake  Airway & Oxygen Therapy: Patient Spontanous Breathing and Patient connected to face mask oxygen  Post-op Assessment: Report given to PACU RN  Post vital signs: Reviewed and stable  Complications: No apparent anesthesia complications

## 2013-05-04 NOTE — CV Procedure (Signed)
Please see full report for details.  Indications: staph sepsis, joint septic. ?Vegetation Time out performed 1mg  Versed, of Fentanyl  IMPRESSIONS: No vegetations, no masses Mild MR/TR Normal EF Very small PFO likely with very minimal bubble contrast cross over from right to left atrium with provocation (breathing/Valsava). Likely of no clinical consequence.   Overall reassuring TEE. Discussed with patient.

## 2013-05-04 NOTE — Preoperative (Signed)
Beta Blockers   Reason not to administer Beta Blockers:Not Applicable 

## 2013-05-04 NOTE — Op Note (Signed)
05/01/2013 - 05/04/2013  8:01 AM  PATIENT:  Seth Martinez    PRE-OPERATIVE DIAGNOSIS:  Septic Left knee  POST-OPERATIVE DIAGNOSIS:  Same  PROCEDURE:  ARTHROSCOPY KNEE WITH WASHOUT  SURGEON:  Sheral Apley, MD  ASSISTANT: Skip Mayer PA  ANESTHESIA:   Gen  PREOPERATIVE INDICATIONS:  Seth Martinez is a  62 y.o. male with a diagnosis of Septic Left knee who failed conservative measures and elected for surgical management.    The risks benefits and alternatives were discussed with the patient preoperatively including but not limited to the risks of infection, bleeding, nerve injury, cardiopulmonary complications, the need for revision surgery, among others, and the patient was willing to proceed.  OPERATIVE IMPLANTS: None  OPERATIVE FINDINGS: Purulent joint fluid, mechanical   BLOOD LOSS: min  COMPLICATIONS: none  TOURNIQUET TIME: non  OPERATIVE PROCEDURE:  Patient was identified in the preoperative holding area and site was marked by me He was transported to the operating theater and placed on the table in supine position taking care to pad all bony prominences. After a preincinduction time out anesthesia was induced. The Left lower extremity was prepped and draped in normal sterile fashion and a pre-incision timeout was performed. Seth Martinez received vanc for preoperative antibiotics.    I created a superior lateral outflow portal and inserted the trocar I then created an anterior knee on lateral working and viewing portals. I inserted the arthroscopic cannula and camera immediately preop fluid flowed out of the outflow portal. I then began swelling fluid in a linear fashion from the camera to the superior lateral outflow portal. A tour of the knee and noted within only be mechanical cartilage where in combination with synovitis and possible chemical or pressure where of the cartilage. He had some full thickness lesions on both the patellofemoral as well as the  lateral tibial plateau. He had grade 2 lesions on the medial plateau. He had both a lateral small meniscus tear as well as a medial radial meniscus tear. Anterior cruciate ligament is intact. I performed a chondroplasty of the patellofemoral joint as well as a lateral meniscal debridement and a medial partial meniscectomy. I debrided some of the synovium but did not want to cause a lot of bleeding. [place] 33 L bags of arthroscopic fluid through the joint. I then removed all scope equipment expressed all remaining fluid I closed the portals. I then injected a mixture of Depo-Medrol and Marcaine mixed with 10 mg of gentamicin intra-articularly. Sterile dressing was applied he was taken the PACU in stable condition.  POST OPERATIVE PLAN: He'll be weightbearing as tolerated he'll resume his and a moderate regimen per the primary team. He'll also resume his subcutaneous heparin per the primary team.

## 2013-05-04 NOTE — Anesthesia Preprocedure Evaluation (Addendum)
Anesthesia Evaluation  Patient identified by MRN, date of birth, ID band Patient awake    Reviewed: Allergy & Precautions, H&P , NPO status , Patient's Chart, lab work & pertinent test results  Airway Mallampati: I TM Distance: >3 FB Neck ROM: full    Dental  (+) Teeth Intact and Dental Advisory Given   Pulmonary          Cardiovascular hypertension, Rhythm:regular Rate:Normal     Neuro/Psych    GI/Hepatic   Endo/Other  diabetes, Type 2  Renal/GU      Musculoskeletal   Abdominal   Peds  Hematology  (+) REFUSES BLOOD PRODUCTS,   Anesthesia Other Findings   Reproductive/Obstetrics                          Anesthesia Physical Anesthesia Plan  ASA: I  Anesthesia Plan: General   Post-op Pain Management:    Induction: Intravenous  Airway Management Planned: LMA and Oral ETT  Additional Equipment:   Intra-op Plan:   Post-operative Plan: Extubation in OR  Informed Consent: I have reviewed the patients History and Physical, chart, labs and discussed the procedure including the risks, benefits and alternatives for the proposed anesthesia with the patient or authorized representative who has indicated his/her understanding and acceptance.     Plan Discussed with: CRNA, Anesthesiologist and Surgeon  Anesthesia Plan Comments:         Anesthesia Quick Evaluation

## 2013-05-04 NOTE — H&P (View-Only) (Signed)
ORTHOPAEDIC CONSULTATION  REQUESTING PHYSICIAN: Kathlen Mody, MD  Chief Complaint: Left septic knee  HPI: Seth Martinez is a 62 y.o. male who complains of  Increasing pain in the left knee for 2-4days. He has a history of pain in the Right shoulder and hip as well as pain and redness over the right index finger. All but the knee have greatly improved on Abx. All of this started over the last 1-2wks. He was admitted with fever initially. Aspiration performed yesterday with 42,000 White cells.  Past Medical History  Diagnosis Date  . Hyperlipidemia   . Chronic back pain   . NIDDM (non-insulin dependent diabetes mellitus)   . Metabolic syndrome   . Depression   . Insomnia   . Fatigue   . Vertigo   . Joint pain   . Obesity   . Hypertension    Past Surgical History  Procedure Laterality Date  . Rt knee arthroscopic  07/2006  . Fusion lt sacrum with screws  1993  . Fix screws in sacrum  in office  1993  . Back surgery    . Hernia repair     History   Social History  . Marital Status: Married    Spouse Name: N/A    Number of Children: N/A  . Years of Education: N/A   Social History Main Topics  . Smoking status: Never Smoker   . Smokeless tobacco: None  . Alcohol Use: No     Comment: rare  . Drug Use: No  . Sexual Activity: None   Other Topics Concern  . None   Social History Narrative   ** Merged History Encounter **       History reviewed. No pertinent family history. Allergies  Allergen Reactions  . Morphine And Related Anaphylaxis  . Penicillins Anaphylaxis  . Amitriptyline     unk  . Antara [Fenofibrate Micronized]     unk  . Bextra [Valdecoxib]     unk  . Cefdinir     unk  . Celebrex [Celecoxib]     unk  . Crestor [Rosuvastatin Calcium]     unk  . Escitalopram Oxalate     unk  . Fish Oil     unk  . Ibuprofen     unk  . Naproxen Sodium     unk  . Neurontin [Gabapentin]     unk  . Propoxyphene And Methadone     unk  .  Skelaxin     unk  . Ultram [Tramadol]     unk  . Vioxx [Rofecoxib]     unk  . Zocor [Simvastatin]     unk   Prior to Admission medications   Medication Sig Start Date End Date Taking? Authorizing Provider  acetaminophen (TYLENOL) 500 MG tablet Take 500 mg by mouth every 6 (six) hours as needed for pain.   Yes Historical Provider, MD  cyclobenzaprine (FLEXERIL) 10 MG tablet Take 10 mg by mouth daily as needed for muscle spasms.   Yes Historical Provider, MD  dextromethorphan (DELSYM) 30 MG/5ML liquid Take 60 mg by mouth every 12 (twelve) hours as needed for cough.   Yes Historical Provider, MD  ibuprofen (ADVIL,MOTRIN) 200 MG tablet Take 200 mg by mouth every 6 (six) hours as needed for pain.   Yes Historical Provider, MD  oxyCODONE-acetaminophen (PERCOCET/ROXICET) 5-325 MG per tablet Take 1 tablet by mouth every 6 (six) hours as needed for pain. 04/30/13  Yes Dorna Leitz, MD  Pseudoeph-Doxylamine-DM-APAP (NYQUIL PO) Take 30 mLs by mouth at bedtime as needed (cold symptoms).   Yes Historical Provider, MD  Pseudoephedrine-APAP-DM (DAYQUIL PO) Take 30 mLs by mouth every 6 (six) hours as needed (cold symptoms).   Yes Historical Provider, MD   Dg Knee 2 Views Left  05/02/2013   CLINICAL DATA:  Joint pain.  EXAM: LEFT KNEE - 1-2 VIEW  COMPARISON:  None.  FINDINGS: There is subcutaneous emphysema in a line along the medial knee, presumably from reported arthrocentesis. Small to moderate knee joint effusion, suprapatellar. No osteolysis. Mild tricompartmental osteoarthritis, without focal or advanced joint narrowing. No fracture, malalignment, or other focal osseous abnormality.  IMPRESSION: 1. Mild to moderate volume knee joint effusion. 2. Mild tricompartmental osteoarthritis. 3. Subcutaneous gas, presumably from reported arthrocentesis.   Electronically Signed   By: Tiburcio Pea M.D.   On: 05/02/2013 00:08   Ct Head Wo Contrast  05/03/2013   CLINICAL DATA:  Losing vision in left eye.  EXAM: CT  HEAD WITHOUT CONTRAST  TECHNIQUE: Contiguous axial images were obtained from the base of the skull through the vertex without intravenous contrast.  COMPARISON:  None.  FINDINGS: Skull:No significant abnormality. Mild thickening of the dorsum sella.  Orbits: No evidence of mass. Symmetric appearing globes.  Brain: No evidence of acute abnormality, such as acute infarction, hemorrhage, hydrocephalus, or mass lesion/mass effect. No evidence of pituitary mass, meningioma, or other causes of left eye vision loss.  IMPRESSION: No acute intracranial findings.  No cause for left eye vision loss.   Electronically Signed   By: Tiburcio Pea M.D.   On: 05/03/2013 02:26   Dg Hand 2 View Right  05/02/2013   CLINICAL DATA:  Pain, no injury  EXAM: RIGHT HAND - 2 VIEW  COMPARISON:  None.  FINDINGS: There is no evidence of fracture or dislocation. There is no evidence of arthropathy or other focal bone abnormality. Mild soft tissue swelling is present  IMPRESSION: No acute osseous abnormality.   Electronically Signed   By: Davonna Belling M.D.   On: 05/02/2013 00:09    Positive ROS: All other systems have been reviewed and were otherwise negative with the exception of those mentioned in the HPI and as above.  Labs cbc  Recent Labs  05/02/13 0345 05/03/13 0605  WBC 14.2* 20.1*  HGB 13.2 14.1  HCT 37.6* 42.6  PLT 176 241    Labs inflam  Recent Labs  05/01/13 2215  CRP 36.8*    Labs coag No results found for this basename: INR, PT, PTT,  in the last 72 hours   Recent Labs  05/02/13 0345 05/03/13 0605  NA 135 138  K 3.3* 3.6  CL 99 97  CO2 24 25  GLUCOSE 122* 110*  BUN 18 16  CREATININE 1.20 1.08  CALCIUM 7.8* 8.5    Physical Exam: Filed Vitals:   05/03/13 1652  BP: 151/82  Pulse: 73  Temp: 98.3 F (36.8 C)  Resp: 18   General: Alert, no acute distress Cardiovascular: No pedal edema Respiratory: No cyanosis, no use of accessory musculature GI: No organomegaly, abdomen is soft  and non-tender Skin: No lesions in the area of chief complaint Neurologic: Sensation intact distally Psychiatric: Patient is competent for consent with normal mood and affect Lymphatic: No axillary or cervical lymphadenopathy  MUSCULOSKELETAL:  LLE: sig effusion at L knee. Pain with ROM, some erythema over anterior knee. Distally NVI, no other painful ROM.  RUE: some pain with [place]oulder ROM but  painless small arc motion. No erythema over joint. Able to abduct 70-80 degrees without pain. Receding erythema and swelling over Index finger. No pain with small arc motion of digit joints. No fusiform swelling. No kanaval signs. Other extremities are atraumatic with painless ROM and NVI.  Assessment: Left septic knee  Plan: I performed an injection of Gentamycin (10cc of 1mg /mL solution) into the Left knee using sterile technique after appropriate timeout. I will perform an Arthroscopic I&D tomorrow am, I was asked to wait until Echocardiogram could be performed prior to anasthetic so I elected to perform the [facility] injection. This has also been brewing for greater than 48hrs given his history.   The Right shoudler and Finger are improving on IV abx so will continue to observe these with the relatively painless ROM  Will f/u R shoudler MRI ordered by ID. Weight Bearing Status: WBAT all extremities PT VTE px: SCD's and Chemical per primary, please hold morning dose.    Margarita Rana, D, MD Cell [phone]   05/03/2013 5:20 PM

## 2013-05-04 NOTE — Interval H&P Note (Signed)
History and Physical Interval Note:  05/04/2013 7:05 AM  Seth Martinez  has presented today for surgery, with the diagnosis of Septic Left knee  The various methods of treatment have been discussed with the patient and family. After consideration of risks, benefits and other options for treatment, the patient has consented to  Procedure(s): ARTHROSCOPY KNEE washout (Left) as a surgical intervention .  The patient's history has been reviewed, patient examined, no change in status, stable for surgery.  I have reviewed the patient's chart and labs.  Questions were answered to the patient's satisfaction.     Seth Martinez, D

## 2013-05-04 NOTE — Progress Notes (Signed)
  Subjective:  Recovering from knee surgery earlier today. Wife/family in room.  Objective:  Vital Signs in the last 24 hours: Temp:  [97.7 F (36.5 C)-99.1 F (37.3 C)] 97.7 F (36.5 C) (10/16 0930) Pulse Rate:  [73-109] 87 (10/16 0930) Resp:  [18-30] 18 (10/16 0930) BP: (128-171)/(76-99) 128/76 mmHg (10/16 0930) SpO2:  [93 %-99 %] 93 % (10/16 0930)  Intake/Output from previous day: 10/15 0701 - 10/16 0700 In: 240 [P.O.:240] Out: 550 [Urine:550]   Physical Exam: General: Well developed, well nourished, in no acute distress. Head:  Normocephalic and atraumatic. Lungs: Clear to auscultation and percussion. Heart: Normal S1 and S2.  No murmur, rubs or gallops.  Abdomen: soft, non-tender, positive bowel sounds. Obese Extremities: No clubbing or cyanosis. No edema. Neurologic: Alert and oriented x 3.    Lab Results:  Recent Labs  05/02/13 0345 05/03/13 0605  WBC 14.2* 20.1*  HGB 13.2 14.1  PLT 176 241    Recent Labs  05/02/13 0345 05/03/13 0605  NA 135 138  K 3.3* 3.6  CL 99 97  CO2 24 25  GLUCOSE 122* 110*  BUN 18 16  CREATININE 1.20 1.08   No results found for this basename: TROPONINI, CK, MB,  in the last 72 hours Hepatic Function Panel No results found for this basename: PROT, ALBUMIN, AST, ALT, ALKPHOS, BILITOT, BILIDIR, IBILI,  in the last 72 hours No results found for this basename: CHOL,  in the last 72 hours No results found for this basename: PROTIME,  in the last 72 hours  Imaging: Ct Head Wo Contrast  05/03/2013   CLINICAL DATA:  Losing vision in left eye.  EXAM: CT HEAD WITHOUT CONTRAST  TECHNIQUE: Contiguous axial images were obtained from the base of the skull through the vertex without intravenous contrast.  COMPARISON:  None.  FINDINGS: Skull:No significant abnormality. Mild thickening of the dorsum sella.  Orbits: No evidence of mass. Symmetric appearing globes.  Brain: No evidence of acute abnormality, such as acute infarction, hemorrhage,  hydrocephalus, or mass lesion/mass effect. No evidence of pituitary mass, meningioma, or other causes of left eye vision loss.  IMPRESSION: No acute intracranial findings.  No cause for left eye vision loss.   Electronically Signed   By: Tiburcio Pea M.D.   On: 05/03/2013 02:26    Transthoracic echocardiogram: Normal ejection fraction, mild valvular regurgitation, no vegetations detected.  Assessment/Plan:  Principal Problem:   Septic arthritis of multiple joints  62 year old male with septic joints, disseminated staph aureus bacteremia, left eye visual field deficit, questionable endocarditis.  -I had lengthy discussion with he and his family about transesophageal echocardiogram. He is willing to proceed. Risks and benefits of procedure including esophageal perforation, bleeding, need for emergent mechanical ventilation have been discussed. They're willing to proceed.   Seth Martinez 05/04/2013, 1:54 PM

## 2013-05-04 NOTE — Progress Notes (Signed)
[facility]  Date of Admission:  05/01/2013  Antibiotics: Antibiotics Given (last 72 hours)   Date/Time Action Medication Dose Rate   05/02/13 0249 Given   doxycycline (VIBRAMYCIN) 100 mg in dextrose 5 % 250 mL IVPB 100 mg 125 mL/hr   05/02/13 1029 Given   vancomycin (VANCOCIN) 1,500 mg in sodium chloride 0.9 % 500 mL IVPB 1,500 mg 250 mL/hr   05/02/13 1432 Given   doxycycline (VIBRAMYCIN) 100 mg in dextrose 5 % 250 mL IVPB 100 mg 125 mL/hr   05/02/13 2219 Given   vancomycin (VANCOCIN) 1,500 mg in sodium chloride 0.9 % 500 mL IVPB 1,500 mg 250 mL/hr   05/03/13 0200 Given   doxycycline (VIBRAMYCIN) 100 mg in dextrose 5 % 250 mL IVPB 100 mg 125 mL/hr   05/03/13 1047 Given   vancomycin (VANCOCIN) 1,500 mg in sodium chloride 0.9 % 500 mL IVPB 1,500 mg 250 mL/hr   05/03/13 2157 Given   vancomycin (VANCOCIN) 1,500 mg in sodium chloride 0.9 % 500 mL IVPB 1,500 mg 250 mL/hr   05/04/13 1191 Given   clindamycin (CLEOCIN) IVPB 900 mg 900 mg    05/04/13 0750 Given  [40mg  gent mixed with 40ml of injectable saline.]   gentamicin (GARAMYCIN) injection 40 mg    05/04/13 1215 Given   vancomycin (VANCOCIN) 1,500 mg in sodium chloride 0.9 % 500 mL IVPB 1,500 mg 250 mL/hr      Subjective: Knee wash out today, currently going to get TEE  Objective: Temp:  [97.7 F (36.5 C)-99.1 F (37.3 C)] 97.7 F (36.5 C) (10/16 0930) Pulse Rate:  [73-109] 87 (10/16 0930) Resp:  [18-30] 18 (10/16 0930) BP: (128-171)/(76-99) 128/76 mmHg (10/16 0930) SpO2:  [93 %-99 %] 93 % (10/16 0930)  Unable to examine  Lab Results Lab Results  Component Value Date   WBC 20.1* 05/03/2013   HGB 14.1 05/03/2013   HCT 42.6 05/03/2013   MCV 88.8 05/03/2013   PLT 241 05/03/2013    Lab Results  Component Value Date   CREATININE 1.08 05/03/2013   BUN 16 05/03/2013   NA 138 05/03/2013   K 3.6 05/03/2013   CL 97 05/03/2013   CO2 25 05/03/2013    Lab Results  Component Value Date   ALT 27 04/30/2013   AST 40* 04/30/2013   ALKPHOS 84 04/30/2013   BILITOT 1.1 04/30/2013      Microbiology: Recent Results (from the past 240 hour(s))  CULTURE, BLOOD (ROUTINE X 2)     Status: None   Collection Time    05/01/13 10:15 PM      Result Value Range Status   Specimen Description BLOOD FOREARM LEFT IV START   Final   Special Requests BOTTLES DRAWN AEROBIC AND ANAEROBIC 10CC EA   Final   Culture  Setup Time     Final   Value: 05/02/2013 07:40     Performed at [facility]   Culture     Final   Value: METHICILLIN RESISTANT STAPHYLOCOCCUS AUREUS     Note: RIFAMPIN AND GENTAMICIN SHOULD NOT BE USED AS SINGLE DRUGS FOR TREATMENT OF STAPH INFECTIONS. CRITICAL RESULT CALLED TO, READ BACK BY AND VERIFIED WITH: COURTNEY D @1421  05/04/13 BY KRAWS     Note: Gram Stain Report Called to,Read Back By and Verified With: PEACE DORMON ON 05/02/2013 AT 10:06P BY Serafina Mitchell     Performed at [facility]   Report Status 05/04/2013 FINAL   Final  Organism ID, Bacteria METHICILLIN RESISTANT STAPHYLOCOCCUS AUREUS   Final  GRAM STAIN     Status: None   Collection Time    05/01/13 10:28 PM      Result Value Range Status   Specimen Description SYNOVIAL FLUID KNEE LEFT   Final   Special Requests Normal   Final   Gram Stain     Final   Value: ABUNDANT WBC PRESENT,BOTH PMN AND MONONUCLEAR     NO ORGANISMS SEEN   Report Status 05/01/2013 FINAL   Final  BODY FLUID CULTURE     Status: None   Collection Time    05/01/13 10:28 PM      Result Value Range Status   Specimen Description SYNOVIAL FLUID KNEE LEFT   Final   Special Requests Normal   Final   Gram Stain     Final   Value: ABUNDANT WBC PRESENT,BOTH PMN AND MONONUCLEAR     NO ORGANISMS SEEN     Performed at [facility]     Performed at [facility]   Culture     Final   Value: MODERATE METHICILLIN RESISTANT STAPHYLOCOCCUS AUREUS     Note: CRITICAL RESULT CALLED TO, READ BACK BY AND VERIFIED WITH: COURTNEY D  @1421  05/04/13 BY KRAWS     Performed at [facility]   Report Status 05/04/2013 FINAL   Final   Organism ID, Bacteria METHICILLIN RESISTANT STAPHYLOCOCCUS AUREUS   Final  CULTURE, BLOOD (ROUTINE X 2)     Status: None   Collection Time    05/01/13 11:48 PM      Result Value Range Status   Specimen Description BLOOD LEFT HAND   Final   Special Requests BOTTLES DRAWN AEROBIC ONLY 8CC   Final   Culture  Setup Time     Final   Value: 05/02/2013 07:39     Performed at [facility]   Culture     Final   Value: STAPHYLOCOCCUS AUREUS     Note: SUSCEPTIBILITIES PERFORMED ON PREVIOUS CULTURE WITHIN THE LAST 5 DAYS.     Note: Gram Stain Report Called to,Read Back By and Verified With: PEACE DORMON ON 05/02/2013 AT 10:06P BY WILEJ     Performed at [facility]   Report Status 05/04/2013 FINAL   Final  CULTURE, BLOOD (ROUTINE X 2)     Status: None   Collection Time    05/03/13 12:33 PM      Result Value Range Status   Specimen Description BLOOD RIGHT ARM   Final   Special Requests BOTTLES DRAWN AEROBIC AND ANAEROBIC 5CC   Final   Culture  Setup Time     Final   Value: 05/03/2013 17:05     Performed at [facility]   Culture     Final   Value:        BLOOD CULTURE RECEIVED NO GROWTH TO DATE CULTURE WILL BE HELD FOR 5 DAYS BEFORE ISSUING A FINAL NEGATIVE REPORT     Performed at [facility]   Report Status PENDING   Incomplete  CULTURE, BLOOD (ROUTINE X 2)     Status: None   Collection Time    05/03/13 12:40 PM      Result Value Range Status   Specimen Description BLOOD LEFT HAND   Final   Special Requests BOTTLES DRAWN AEROBIC AND ANAEROBIC 5CC   Final   Culture  Setup Time     Final   Value: 05/03/2013  17:05     Performed at [facility]     Final   Value:        BLOOD CULTURE RECEIVED NO GROWTH TO DATE CULTURE WILL BE HELD FOR 5 DAYS BEFORE ISSUING A FINAL NEGATIVE REPORT     Performed at [facility]   Report Status  PENDING   Incomplete  SURGICAL PCR SCREEN     Status: Abnormal   Collection Time    05/03/13  5:13 PM      Result Value Range Status   MRSA, PCR POSITIVE (*) NEGATIVE Final   Staphylococcus aureus POSITIVE (*) NEGATIVE Final   Comment:            The Xpert SA Assay (FDA     approved for NASAL specimens     in patients over 8 years of age),     is one component of     a comprehensive surveillance     program.  Test performance has     been validated by [employer] for patients greater     than or equal to 21 year old.     It is not intended     to diagnose infection nor to     guide or monitor treatment.    Studies/Results: Ct Head Wo Contrast  05/03/2013   CLINICAL DATA:  Losing vision in left eye.  EXAM: CT HEAD WITHOUT CONTRAST  TECHNIQUE: Contiguous axial images were obtained from the base of the skull through the vertex without intravenous contrast.  COMPARISON:  None.  FINDINGS: Skull:No significant abnormality. Mild thickening of the dorsum sella.  Orbits: No evidence of mass. Symmetric appearing globes.  Brain: No evidence of acute abnormality, such as acute infarction, hemorrhage, hydrocephalus, or mass lesion/mass effect. No evidence of pituitary mass, meningioma, or other causes of left eye vision loss.  IMPRESSION: No acute intracranial findings.  No cause for left eye vision loss.   Electronically Signed   By: Tiburcio Pea M.D.   On: 05/03/2013 02:26    Assessment/Plan: 1)  Disseminated MRSA bacteremia - on vancomycin, will need prolonged course.  TEE now for concern of disseminated disease.  MRI of shoulder, back with recent complaints.    Staci Righter, MD [facility] [email or web address] [phone] pager   [phone] cell 05/04/2013, 2:36 PM

## 2013-05-04 NOTE — Progress Notes (Signed)
  Echocardiogram Echocardiogram Transesophageal has been performed.  Seth Martinez, Seth Martinez 05/04/2013, 4:54 PM

## 2013-05-04 NOTE — Interval H&P Note (Signed)
History and Physical Interval Note:  05/04/2013 4:03 PM  Seth Martinez  has presented today for surgery, with the diagnosis of stroke  The various methods of treatment have been discussed with the patient and family. After consideration of risks, benefits and other options for treatment, the patient has consented to  Procedure(s): TRANSESOPHAGEAL ECHOCARDIOGRAM (TEE) (N/A) as a surgical intervention .  The patient's history has been reviewed, patient examined, no change in status, stable for surgery.  I have reviewed the patient's chart and labs.  Questions were answered to the patient's satisfaction.     Yashvi Jasinski

## 2013-05-04 NOTE — Anesthesia Procedure Notes (Signed)
Procedure Name: Intubation Date/Time: 05/04/2013 7:26 AM Performed by: Jefm Miles E Pre-anesthesia Checklist: Patient identified, Emergency Drugs available, Suction available, Patient being monitored and Timeout performed Patient Re-evaluated:Patient Re-evaluated prior to inductionOxygen Delivery Method: Circle system utilized Preoxygenation: Pre-oxygenation with 100% oxygen Intubation Type: IV induction and Rapid sequence Ventilation: Two handed mask ventilation required Laryngoscope Size: Mac and 4 Grade View: Grade II Tube type: Oral Tube size: 7.5 mm Number of attempts: 1 Airway Equipment and Method: Stylet Placement Confirmation: ETT inserted through vocal cords under direct vision,  positive ETCO2 and breath sounds checked- equal and bilateral Secured at: 24 cm Tube secured with: Tape Dental Injury: Teeth and Oropharynx as per pre-operative assessment

## 2013-05-04 NOTE — Progress Notes (Signed)
.  CRITICAL VALUE ALERT  Critical value received:  Pos MRSA in blood and synovial fluid  Date of notification:  05-04-13  Time of notification:  1425  Critical value read back:yes  Nurse who received alert:  Romie Levee  MD notified (1st page):  Blake Divine  Time of first page:  1428  MD notified (2nd page):  Time of second page:

## 2013-05-05 ENCOUNTER — Encounter (HOSPITAL_COMMUNITY): Payer: Self-pay | Admitting: Cardiology

## 2013-05-05 ENCOUNTER — Inpatient Hospital Stay (HOSPITAL_COMMUNITY): Payer: Medicare Other

## 2013-05-05 LAB — CBC
HCT: 41.1 % (ref 39.0–52.0)
Hemoglobin: 13.8 g/dL (ref 13.0–17.0)
MCHC: 33.6 g/dL (ref 30.0–36.0)
MCV: 90.5 fL (ref 78.0–100.0)
Platelets: 292 10*3/uL (ref 150–400)
RBC: 4.54 MIL/uL (ref 4.22–5.81)
WBC: 17.8 10*3/uL — ABNORMAL HIGH (ref 4.0–10.5)

## 2013-05-05 LAB — HEMOGLOBIN A1C: Hgb A1c MFr Bld: 5.9 % — ABNORMAL HIGH (ref <5.7)

## 2013-05-05 LAB — GLUCOSE, CAPILLARY
Glucose-Capillary: 154 mg/dL — ABNORMAL HIGH (ref 70–99)
Glucose-Capillary: 87 mg/dL (ref 70–99)

## 2013-05-05 LAB — BASIC METABOLIC PANEL
CO2: 33 mEq/L — ABNORMAL HIGH (ref 19–32)
Calcium: 8.3 mg/dL — ABNORMAL LOW (ref 8.4–10.5)
Chloride: 99 mEq/L (ref 96–112)
Creatinine, Ser: 0.97 mg/dL (ref 0.50–1.35)
Glucose, Bld: 135 mg/dL — ABNORMAL HIGH (ref 70–99)

## 2013-05-05 MED ORDER — IOHEXOL 300 MG/ML  SOLN
100.0000 mL | Freq: Once | INTRAMUSCULAR | Status: AC | PRN
  Administered 2013-05-05: 100 mL via INTRAVENOUS

## 2013-05-05 NOTE — Progress Notes (Signed)
Radiologist called RN to stating that pt is to obese to do the MRI. He was afraid that the patient might get stuck in the machine. Pt has been brought back to floor. Will continue to monitor

## 2013-05-05 NOTE — Progress Notes (Signed)
Patient ID: Seth Martinez, male   DOB: 10-21-1950, 62 y.o.   MRN: [id number]  PROGRESS NOTE  Subjective:  negative for Chest Pain  negative for Shortness of Breath  negative for Nausea/Vomiting   negative for Calf Pain  positive for Bowel Movement   Tolerating Diet: yes         Patient reports knee pain as 7 on 0-10 scale.       Objective: Vital signs in last 24 hours:   Patient Vitals for the past 24 hrs:  BP Temp Temp src Pulse Resp SpO2  05/05/13 1400 156/74 mmHg 97.7 F (36.5 C) Oral 76 20 98 %  05/05/13 0605 151/99 mmHg 98 F (36.7 C) Oral 79 20 95 %  05/05/13 0019 168/83 mmHg 98.7 F (37.1 C) Oral 88 21 100 %  05/04/13 2201 151/78 mmHg 99 F (37.2 C) Oral 88 20 95 %  05/04/13 2023 160/82 mmHg 99.9 F (37.7 C) Oral 89 - -  05/04/13 1929 144/70 mmHg 100.8 F (38.2 C) Oral 94 20 94 %  05/04/13 1836 167/74 mmHg 99.2 F (37.3 C) Oral 93 20 93 %  05/04/13 1748 153/72 mmHg 100.2 F (37.9 C) Oral 92 22 96 %  05/04/13 1705 171/86 mmHg - - 92 - 93 %  05/04/13 1655 194/83 mmHg - - 93 - 93 %  05/04/13 1645 182/77 mmHg - - 95 19 94 %  05/04/13 1635 151/78 mmHg - - 93 25 92 %  05/04/13 1625 152/77 mmHg - - - - -  05/04/13 1620 167/60 mmHg - - 84 24 91 %  05/04/13 1615 - - - 83 29 93 %  05/04/13 1610 171/65 mmHg - - 85 23 90 %  05/04/13 1605 173/99 mmHg - - 92 26 89 %  05/04/13 1600 197/145 mmHg - - 99 23 95 %  05/04/13 1555 - - - 116 20 90 %      Intake/Output from previous day:   10/16 0701 - 10/17 0700 In: 500 [I.V.:500] Out: 602 [Urine:501]   Intake/Output this shift:   10/17 0701 - 10/17 1900 In: 600 [P.O.:600] Out: 600 [Urine:600]   Intake/Output     10/16 0701 - 10/17 0700 10/17 0701 - 10/18 0700   P.O.  600   I.V. (mL/kg) 500 (3.4)    Total Intake(mL/kg) 500 (3.4) 600 (4.1)   Urine (mL/kg/hr) 501 (0.1) 600 (0.5)   Stool 1 (0)    Blood 100 (0)    Total Output 602 600   Net -102 0           LABORATORY DATA:  Recent Labs  04/30/13 1440  05/01/13 2215 05/02/13 0345 05/03/13 0605 05/05/13 0545  WBC 15.4* 16.6* 14.2* 20.1* 17.8*  HGB 14.9 13.4 13.2 14.1 13.8  HCT 42.5 38.7* 37.6* 42.6 41.1  PLT 185 199 176 241 292    Recent Labs  04/30/13 1440 05/02/13 0345 05/03/13 0605 05/05/13 0545  NA 134* 135 138 139  K 4.0 3.3* 3.6 3.8  CL 96 99 97 99  CO2 21 24 25  33*  BUN 16 18 16 17   CREATININE 1.37* 1.20 1.08 0.97  GLUCOSE 137* 122* 110* 135*  CALCIUM 8.9 7.8* 8.5 8.3*   No results found for this basename: INR, PROTIME    Examination:  General appearance: alert, cooperative and no distress  Wound Exam: dressing in place  Drainage: no drainage noted through dressing  Motor Exam: grossly intact bilateral LE  Sensory Exam: grossly intact  bilateral LE  Vascular Exam: Normal  Assessment:    1 Day Post-Op  Procedure(s) (LRB): TRANSESOPHAGEAL ECHOCARDIOGRAM (TEE) (N/A)  ADDITIONAL DIAGNOSIS:  Principal Problem:   Septic arthritis of multiple joints Active Problems:   Morbid obesity   Staphylococcus aureus septicemia     Plan: Weight bearing as tolerated  DVT Prophylaxis: Heparin per primary team  continue current pain management  Will continue to follow.          [name] 05/05/2013, 3:21 PM

## 2013-05-05 NOTE — Progress Notes (Signed)
PATIENT DETAILS Name: Seth Martinez Age: 62 y.o. Sex: male Date of Birth: 1950-10-24 Admit Date: 05/01/2013 Admitting Physician Hillary Bow, DO ZOX:[id number], Dorinda Hill, MD  Subjective: Admitted with fever, cough cold-for 1.5 weeks, then started having left knee pain swelling, right shoulder pain, right hip pain and right index finger pain/swelling.   Assessment/Plan: Principal Problem: Polyarticular Arthritis/ septic arthritis:  -following URI symptoms-did have one day of diarrhea. - Multiple joint pains associated with redness and swelling. He underwent arthrocentesis. The analysis showed Synovial fluid WBC 42070, gram stain of synovial fluid neg, synovial cx  Negative so far. . - Blood cultures show MRSA.  -will continue with IV Vanco for now, will need prolonged course.  -monitor and follow clinical course closely. - cardiology called for TEE  to evaluate for endocarditis, TEE done yesterday does not reveal any vegetations.  - CT shoulder and lumbar spine does not reveal any infection.  - possible PICC in am , if the recent blood cultures are negative  - d/c telemetry.     left eye vision loss:  IMPROVING. Patient complained of sudden vision in the left eye . Ophthalmology consulted, followed by retina Specialist-  show vitritis possibly from the disseminated staph infection.  CT head did nto show any acute stroke.   Hypertension: Prn hydralazine.   DVT prophylaxis   Chronic Back pain -c/w supportive care  Disposition: Remain inpatient  DVT Prophylaxis: Prophylactic Heparin  Code Status: Full code   Family Communication -spouse at bedside  Procedures:  Arthrocentesis 10/13  Arthroscopy   TEE  CONSULTS: ID OPHTHALMOLOGY Orthopedics cardiology   MEDICATIONS: Scheduled Meds: . Chlorhexidine Gluconate Cloth  6 each Topical Q0600  . gentamicin  40 mg Intramuscular Once  . heparin  5,000 Units Subcutaneous Q8H  . mupirocin ointment  1  application Nasal BID  . polyethylene glycol  17 g Oral BID  . senna  2 tablet Oral BID  . vancomycin  1,500 mg Intravenous Q12H   Continuous Infusions: . sodium chloride 50 mL/hr at 05/04/13 1909   PRN Meds:.acetaminophen, cyclobenzaprine, hydrALAZINE, oxyCODONE-acetaminophen  Antibiotics: Anti-infectives   Start     Dose/Rate Route Frequency Ordered Stop   05/04/13 1100  vancomycin (VANCOCIN) 1,500 mg in sodium chloride 0.9 % 500 mL IVPB     1,500 mg 250 mL/hr over 120 Minutes Intravenous Every 12 hours 05/04/13 1048     05/04/13 0750  gentamicin (GARAMYCIN) injection  Status:  Discontinued       As needed 05/04/13 0750 05/04/13 0831   05/04/13 0600  clindamycin (CLEOCIN) IVPB 900 mg     900 mg 100 mL/hr over 30 Minutes Intravenous On call to O.R. 05/03/13 1958 05/04/13 0728   05/03/13 1630  gentamicin (GARAMYCIN) injection 40 mg     40 mg Intramuscular  Once 05/03/13 1608     05/02/13 1000  vancomycin (VANCOCIN) 1,500 mg in sodium chloride 0.9 % 500 mL IVPB  Status:  Discontinued     1,500 mg 250 mL/hr over 120 Minutes Intravenous Every 12 hours 05/02/13 0233 05/04/13 1048   05/02/13 0200  doxycycline (VIBRAMYCIN) 100 mg in dextrose 5 % 250 mL IVPB  Status:  Discontinued     100 mg 125 mL/hr over 120 Minutes Intravenous Every 12 hours 05/02/13 0148 05/03/13 1111   05/02/13 0000  vancomycin (VANCOCIN) 2,500 mg in sodium chloride 0.9 % 500 mL IVPB     2,500 mg 250 mL/hr over 120 Minutes Intravenous  Once 05/01/13 2345 05/02/13 5409  PHYSICAL EXAM: Vital signs in last 24 hours: Filed Vitals:   05/04/13 2023 05/04/13 2201 05/05/13 0019 05/05/13 0605  BP: 160/82 151/78 168/83 151/99  Pulse: 89 88 88 79  Temp: 99.9 F (37.7 C) 99 F (37.2 C) 98.7 F (37.1 C) 98 F (36.7 C)  TempSrc: Oral Oral Oral Oral  Resp:  20 21 20   Height:      Weight:      SpO2:  95% 100% 95%    Weight change:  Filed Weights   05/01/13 2116 05/02/13 0102  Weight: 145.605 kg (321 lb)  147.9 kg (326 lb 1 oz)   Body mass index is 48.13 kg/(m^2).   Gen Exam: sleeping comfortably Chest: B/L Clear.  CVS: S1 S2 Regular, no murmurs.  Abdomen: soft, BS +, non tender, non distended.  Extremities: no edema, lower extremities warm to touch. Left Knee swollen/erythematous/tender, right groin tender, right shoulder pain, right index finger-proximal part swollen and tender/red(?sausafe digit)left ankle swollen.  Neurologic: Non Focal.   Skin: No Rash.    Intake/Output from previous day:  Intake/Output Summary (Last 24 hours) at 05/05/13 0906 Last data filed at 05/04/13 2154  Gross per 24 hour  Intake      0 ml  Output    502 ml  Net   -502 ml     LAB RESULTS: CBC  Recent Labs Lab 04/30/13 1440 05/01/13 2215 05/02/13 0345 05/03/13 0605 05/05/13 0545  WBC 15.4* 16.6* 14.2* 20.1* 17.8*  HGB 14.9 13.4 13.2 14.1 13.8  HCT 42.5 38.7* 37.6* 42.6 41.1  PLT 185 199 176 241 292  MCV 86.4 86.0 86.6 88.8 90.5  MCH 30.3 29.8 30.4 29.4 30.4  MCHC 35.1 34.6 35.1 33.1 33.6  RDW 13.5 13.8 13.8 14.3 14.8  LYMPHSABS 0.7 0.9  --   --   --   MONOABS 1.6* 2.0*  --   --   --   EOSABS 0.0 0.0  --   --   --   BASOSABS 0.1 0.0  --   --   --     Chemistries   Recent Labs Lab 04/30/13 1440 05/02/13 0345 05/03/13 0605 05/05/13 0545  NA 134* 135 138 139  K 4.0 3.3* 3.6 3.8  CL 96 99 97 99  CO2 21 24 25  33*  GLUCOSE 137* 122* 110* 135*  BUN 16 18 16 17   CREATININE 1.37* 1.20 1.08 0.97  CALCIUM 8.9 7.8* 8.5 8.3*    CBG:  Recent Labs Lab 05/02/13 0039 05/04/13 0519 05/04/13 0841 05/05/13 0739  GLUCAP 164* 152* 118* 130*    GFR Estimated Creatinine Clearance: 112 ml/min (by C-G formula based on Cr of 0.97).  Coagulation profile No results found for this basename: INR, PROTIME,  in the last 168 hours  Cardiac Enzymes No results found for this basename: CK, CKMB, TROPONINI, MYOGLOBIN,  in the last 168 hours  No components found with this basename: POCBNP,  No  results found for this basename: DDIMER,  in the last 72 hours No results found for this basename: HGBA1C,  in the last 72 hours No results found for this basename: CHOL, HDL, LDLCALC, TRIG, CHOLHDL, LDLDIRECT,  in the last 72 hours No results found for this basename: TSH, T4TOTAL, FREET3, T3FREE, THYROIDAB,  in the last 72 hours No results found for this basename: VITAMINB12, FOLATE, FERRITIN, TIBC, IRON, RETICCTPCT,  in the last 72 hours No results found for this basename: LIPASE, AMYLASE,  in the last 72 hours  Urine  Studies No results found for this basename: UACOL, UAPR, USPG, UPH, UTP, UGL, UKET, UBIL, UHGB, UNIT, UROB, ULEU, UEPI, UWBC, URBC, UBAC, CAST, CRYS, UCOM, BILUA,  in the last 72 hours  MICROBIOLOGY: Recent Results (from the past 240 hour(s))  CULTURE, BLOOD (ROUTINE X 2)     Status: None   Collection Time    05/01/13 10:15 PM      Result Value Range Status   Specimen Description BLOOD FOREARM LEFT IV START   Final   Special Requests BOTTLES DRAWN AEROBIC AND ANAEROBIC 10CC EA   Final   Culture  Setup Time     Final   Value: 05/02/2013 07:40     Performed at [facility]   Culture     Final   Value: METHICILLIN RESISTANT STAPHYLOCOCCUS AUREUS     Note: RIFAMPIN AND GENTAMICIN SHOULD NOT BE USED AS SINGLE DRUGS FOR TREATMENT OF STAPH INFECTIONS. CRITICAL RESULT CALLED TO, READ BACK BY AND VERIFIED WITH: COURTNEY D @1421  05/04/13 BY KRAWS     Note: Gram Stain Report Called to,Read Back By and Verified With: PEACE DORMON ON 05/02/2013 AT 10:06P BY WILEJ     Performed at [facility]   Report Status 05/04/2013 FINAL   Final   Organism ID, Bacteria METHICILLIN RESISTANT STAPHYLOCOCCUS AUREUS   Final  GRAM STAIN     Status: None   Collection Time    05/01/13 10:28 PM      Result Value Range Status   Specimen Description SYNOVIAL FLUID KNEE LEFT   Final   Special Requests Normal   Final   Gram Stain     Final   Value: ABUNDANT WBC PRESENT,BOTH PMN AND  MONONUCLEAR     NO ORGANISMS SEEN   Report Status 05/01/2013 FINAL   Final  BODY FLUID CULTURE     Status: None   Collection Time    05/01/13 10:28 PM      Result Value Range Status   Specimen Description SYNOVIAL FLUID KNEE LEFT   Final   Special Requests Normal   Final   Gram Stain     Final   Value: ABUNDANT WBC PRESENT,BOTH PMN AND MONONUCLEAR     NO ORGANISMS SEEN     Performed at [facility]     Performed at [facility]   Culture     Final   Value: MODERATE METHICILLIN RESISTANT STAPHYLOCOCCUS AUREUS     Note: CRITICAL RESULT CALLED TO, READ BACK BY AND VERIFIED WITH: COURTNEY D @1421  05/04/13 BY KRAWS     Performed at [facility]   Report Status 05/04/2013 FINAL   Final   Organism ID, Bacteria METHICILLIN RESISTANT STAPHYLOCOCCUS AUREUS   Final  CULTURE, BLOOD (ROUTINE X 2)     Status: None   Collection Time    05/01/13 11:48 PM      Result Value Range Status   Specimen Description BLOOD LEFT HAND   Final   Special Requests BOTTLES DRAWN AEROBIC ONLY 8CC   Final   Culture  Setup Time     Final   Value: 05/02/2013 07:39     Performed at [facility]   Culture     Final   Value: STAPHYLOCOCCUS AUREUS     Note: SUSCEPTIBILITIES PERFORMED ON PREVIOUS CULTURE WITHIN THE LAST 5 DAYS.     Note: Gram Stain Report Called to,Read Back By and Verified With: PEACE DORMON ON 05/02/2013 AT 10:06P BY Serafina Mitchell  Performed at [facility]   Report Status 05/04/2013 FINAL   Final  CULTURE, BLOOD (ROUTINE X 2)     Status: None   Collection Time    05/03/13 12:33 PM      Result Value Range Status   Specimen Description BLOOD RIGHT ARM   Final   Special Requests BOTTLES DRAWN AEROBIC AND ANAEROBIC 5CC   Final   Culture  Setup Time     Final   Value: 05/03/2013 17:05     Performed at [facility]   Culture     Final   Value:        BLOOD CULTURE RECEIVED NO GROWTH TO DATE CULTURE WILL BE HELD FOR 5 DAYS BEFORE ISSUING A FINAL NEGATIVE  REPORT     Performed at [facility]   Report Status PENDING   Incomplete  CULTURE, BLOOD (ROUTINE X 2)     Status: None   Collection Time    05/03/13 12:40 PM      Result Value Range Status   Specimen Description BLOOD LEFT HAND   Final   Special Requests BOTTLES DRAWN AEROBIC AND ANAEROBIC 5CC   Final   Culture  Setup Time     Final   Value: 05/03/2013 17:05     Performed at [facility]   Culture     Final   Value:        BLOOD CULTURE RECEIVED NO GROWTH TO DATE CULTURE WILL BE HELD FOR 5 DAYS BEFORE ISSUING A FINAL NEGATIVE REPORT     Performed at [facility]   Report Status PENDING   Incomplete  SURGICAL PCR SCREEN     Status: Abnormal   Collection Time    05/03/13  5:13 PM      Result Value Range Status   MRSA, PCR POSITIVE (*) NEGATIVE Final   Staphylococcus aureus POSITIVE (*) NEGATIVE Final   Comment:            The Xpert SA Assay (FDA     approved for NASAL specimens     in patients over 89 years of age),     is one component of     a comprehensive surveillance     program.  Test performance has     been validated by [employer] for patients greater     than or equal to 98 year old.     It is not intended     to diagnose infection nor to     guide or monitor treatment.    RADIOLOGY STUDIES/RESULTS: Dg Chest 2 View  04/30/2013   CLINICAL DATA:  Back pain and cough.  EXAM: CHEST  2 VIEW  COMPARISON:  No priors.  FINDINGS: Lung volumes are normal. No consolidative airspace disease. No pleural effusions. No pneumothorax. No pulmonary nodule or mass noted. Pulmonary vasculature and the cardiomediastinal silhouette are within normal limits. Old healed fracture of the distal right clavicle incidentally noted.  IMPRESSION: 1.  No radiographic evidence of acute cardiopulmonary disease.   Electronically Signed   By: Trudie Reed M.D.   On: 04/30/2013 15:22   Dg Lumbar Spine 2-3 Views  04/30/2013   CLINICAL DATA:  Back pain.  EXAM: LUMBAR  SPINE - 2-3 VIEW  COMPARISON:  None.  FINDINGS: There is marked convex right scoliosis. Multilevel degenerative change is present. The patient is status post L4-S1 fusion. Hardware appears intact. No fracture is identified.  IMPRESSION: No  acute finding.  Multilevel degenerative change. Status post L4-S1 fusion.   Electronically Signed   By: Drusilla Kanner M.D.   On: 04/30/2013 16:56   Dg Hip Complete Right  04/30/2013   CLINICAL DATA:  Low back pain and right-sided hip pain.  EXAM: RIGHT HIP - COMPLETE 2+ VIEW  COMPARISON:  No priors.  FINDINGS: AP view of the pelvis and AP and lateral views of the right hip demonstrate no definite acute displaced fracture, subluxation, dislocation, joint or soft tissue abnormality. Orthopedic fixation hardware is noted at the lumbosacral junction, and the fixation screw in the low was position on the left appears fractured. No prior studies are available for comparison.  IMPRESSION: 1. No acute radiographic abnormality of the bony pelvis or the left hip. 2. Fracture of the inferior fixation screw on the left side in this patient status post PLIF at L4-S1. Whether or not this is an acute finding or has been present on prior outside studies is uncertain. Correlation with prior outside examinations is suggested.   Electronically Signed   By: Trudie Reed M.D.   On: 04/30/2013 16:50   Dg Knee 2 Views Left  05/02/2013   CLINICAL DATA:  Joint pain.  EXAM: LEFT KNEE - 1-2 VIEW  COMPARISON:  None.  FINDINGS: There is subcutaneous emphysema in a line along the medial knee, presumably from reported arthrocentesis. Small to moderate knee joint effusion, suprapatellar. No osteolysis. Mild tricompartmental osteoarthritis, without focal or advanced joint narrowing. No fracture, malalignment, or other focal osseous abnormality.  IMPRESSION: 1. Mild to moderate volume knee joint effusion. 2. Mild tricompartmental osteoarthritis. 3. Subcutaneous gas, presumably from reported  arthrocentesis.   Electronically Signed   By: Tiburcio Pea M.D.   On: 05/02/2013 00:08   Dg Hand 2 View Right  05/02/2013   CLINICAL DATA:  Pain, no injury  EXAM: RIGHT HAND - 2 VIEW  COMPARISON:  None.  FINDINGS: There is no evidence of fracture or dislocation. There is no evidence of arthropathy or other focal bone abnormality. Mild soft tissue swelling is present  IMPRESSION: No acute osseous abnormality.   Electronically Signed   By: Davonna Belling M.D.   On: 05/02/2013 00:09    Nicolette Gieske, MD  [facility] Pager:[phone]  If 7PM-7AM, please contact night-coverage [email or web address] Password [email or web address] 05/05/2013, 9:06 AM   LOS: 4 days

## 2013-05-05 NOTE — Progress Notes (Signed)
Patient ID: Seth Martinez, male   DOB: 1951/03/04, 62 y.o.   MRN: [id number]  Plan: Continue vancomycin  Assessment: Disseminated staph aureus bacteremia S/P ARTHROSCOPY KNEE WITH WASHOUT day 1   Subjectively: He is doing all right. The pain in his left knee has resolved but his right shoulder continues to ache and the swelling/pain in his right MP joint is still present but better than before. He complains of profuse sweating.     Objectively:   10/16 0700 10/17 0659 10/17 0700 10/17 0810   Most Recent         Temp (F) 97.7 -  100.8 , 98 (36.7)   10/17 0605      Pulse 79 -  116 , 79   10/17 0605      Resp 18 -  30 , 20   10/17 0605      BP 128/76 -  210/95 , 151/99   10/17 0605      SpO2 (%) 89 -  100 , 95   10/17 0605     Resp: Normal vesicular breathing, no added sounds CVS: S1+S2+0 Abdomen: soft, non tender, gs +ve All other examinations normal     10/15 0605    Selected Labs (Up to last 3 results from past 72 hours) Report       10/15 0605   10/17 0545      WBC 20.1   17.8      RBC 4.80  4.54     Hemoglobin 14.1  13.8     HCT 42.6  41.1     Platelets 241  292     Sodium 138  139     Potassium 3.6  3.8     Chloride 97  99     CO2 25  33      BUN 16  17     Creatinine 1.08  0.97     Calcium 8.5  8.3      Glucose 110   135      Body fluid culture [[id number]] Collected: 05/01/13 2228 Updated: 05/04/13 1425 Specimen Type: Body Fluid Specimen Source: Joint, Knee Specimen Description SYNOVIAL FLUID KNEE LEFT Special Requests Normal Gram Stain - Result: ABUNDANT WBC PRESENT,BOTH PMN AND MONONUCLEAR NO ORGANISMS SEEN Performed at [facility] Performed at [facility] Culture - Result: MODERATE METHICILLIN RESISTANT STAPHYLOCOCCUS AUREUS Note: CRITICAL RESULT CALLED TO, READ BACK BY AND VERIFIED WITH: COURTNEY D @1421  05/04/13 BY KRAWS Performed at [facility] Report Status 05/04/2013 FINAL Organism ID, Bacteria METHICILLIN  RESISTANT STAPHYLOCOCCUS AUREUS  Blood culture (routine x 2) [[id number]] Collected: 05/01/13 2215 Updated: 05/04/13 1424 Specimen Type: Blood Specimen Description BLOOD FOREARM LEFT IV START Special Requests BOTTLES DRAWN AEROBIC AND ANAEROBIC 10CC EA Culture Setup Time - Result: 05/02/2013 07:40 Performed at [facility] Culture - Result: METHICILLIN RESISTANT STAPHYLOCOCCUS AUREUS Note: RIFAMPIN AND GENTAMICIN SHOULD NOT BE USED AS SINGLE DRUGS FOR TREATMENT OF STAPH INFECTIONS. CRITICAL RESULT CALLED TO, READ BACK BY AND VERIFIED WITH: COURTNEY D @1421  05/04/13 BY KRAWS Note: Gram Stain Report Called to,Read Back By and Verified With: PEACE DORMON ON 05/02/2013 AT 10:06P BY WILEJ Performed at [facility] Report Status 05/04/2013 FINAL Organism ID, Bacteria METHICILLIN RESISTANT STAPHYLOCOCCUS AUREUS  10/15 TTE  Impressions:  - Normal LV size with mild LV hypertrophy. EF 60-65%. Moderate diastolic dysfunction. Normal RV size and systolic function. No significant valvular abnormalities.   10/17 TEE Impressions:  - No cardiac  source of emboli was indentified. No vegetations.

## 2013-05-05 NOTE — Progress Notes (Addendum)
[facility]  Date of Admission:  05/01/2013  Antibiotics: Antibiotics Given (last 72 hours)   Date/Time Action Medication Dose Rate   05/02/13 1432 Given   doxycycline (VIBRAMYCIN) 100 mg in dextrose 5 % 250 mL IVPB 100 mg 125 mL/hr   05/02/13 2219 Given   vancomycin (VANCOCIN) 1,500 mg in sodium chloride 0.9 % 500 mL IVPB 1,500 mg 250 mL/hr   05/03/13 0200 Given   doxycycline (VIBRAMYCIN) 100 mg in dextrose 5 % 250 mL IVPB 100 mg 125 mL/hr   05/03/13 1047 Given   vancomycin (VANCOCIN) 1,500 mg in sodium chloride 0.9 % 500 mL IVPB 1,500 mg 250 mL/hr   05/03/13 2157 Given   vancomycin (VANCOCIN) 1,500 mg in sodium chloride 0.9 % 500 mL IVPB 1,500 mg 250 mL/hr   05/04/13 1610 Given   clindamycin (CLEOCIN) IVPB 900 mg 900 mg    05/04/13 0750 Given  [40mg  gent mixed with 40ml of injectable saline.]   gentamicin (GARAMYCIN) injection 40 mg    05/04/13 1215 Given   vancomycin (VANCOCIN) 1,500 mg in sodium chloride 0.9 % 500 mL IVPB 1,500 mg 250 mL/hr   05/04/13 2154 Given   vancomycin (VANCOCIN) 1,500 mg in sodium chloride 0.9 % 500 mL IVPB 1,500 mg 250 mL/hr      Subjective: Knee wash out today, currently getting CT scan  Objective: Temp:  [98 F (36.7 C)-100.8 F (38.2 C)] 98 F (36.7 C) (10/17 0605) Pulse Rate:  [79-116] 79 (10/17 0605) Resp:  [19-29] 20 (10/17 0605) BP: (144-210)/(60-145) 151/99 mmHg (10/17 0605) SpO2:  [89 %-100 %] 95 % (10/17 0605)  Unable to examine  Lab Results Lab Results  Component Value Date   WBC 17.8* 05/05/2013   HGB 13.8 05/05/2013   HCT 41.1 05/05/2013   MCV 90.5 05/05/2013   PLT 292 05/05/2013    Lab Results  Component Value Date   CREATININE 0.97 05/05/2013   BUN 17 05/05/2013   NA 139 05/05/2013   K 3.8 05/05/2013   CL 99 05/05/2013   CO2 33* 05/05/2013    Lab Results  Component Value Date   ALT 27 04/30/2013   AST 40* 04/30/2013   ALKPHOS 84 04/30/2013   BILITOT 1.1 04/30/2013       Microbiology: Recent Results (from the past 240 hour(s))  CULTURE, BLOOD (ROUTINE X 2)     Status: None   Collection Time    05/01/13 10:15 PM      Result Value Range Status   Specimen Description BLOOD FOREARM LEFT IV START   Final   Special Requests BOTTLES DRAWN AEROBIC AND ANAEROBIC 10CC EA   Final   Culture  Setup Time     Final   Value: 05/02/2013 07:40     Performed at [facility]   Culture     Final   Value: METHICILLIN RESISTANT STAPHYLOCOCCUS AUREUS     Note: RIFAMPIN AND GENTAMICIN SHOULD NOT BE USED AS SINGLE DRUGS FOR TREATMENT OF STAPH INFECTIONS. CRITICAL RESULT CALLED TO, READ BACK BY AND VERIFIED WITH: COURTNEY D @1421  05/04/13 BY KRAWS     Note: Gram Stain Report Called to,Read Back By and Verified With: PEACE DORMON ON 05/02/2013 AT 10:06P BY Serafina Mitchell     Performed at [facility]   Report Status 05/04/2013 FINAL   Final   Organism ID, Bacteria METHICILLIN RESISTANT STAPHYLOCOCCUS AUREUS   Final  GRAM STAIN     Status: None   Collection  Time    05/01/13 10:28 PM      Result Value Range Status   Specimen Description SYNOVIAL FLUID KNEE LEFT   Final   Special Requests Normal   Final   Gram Stain     Final   Value: ABUNDANT WBC PRESENT,BOTH PMN AND MONONUCLEAR     NO ORGANISMS SEEN   Report Status 05/01/2013 FINAL   Final  BODY FLUID CULTURE     Status: None   Collection Time    05/01/13 10:28 PM      Result Value Range Status   Specimen Description SYNOVIAL FLUID KNEE LEFT   Final   Special Requests Normal   Final   Gram Stain     Final   Value: ABUNDANT WBC PRESENT,BOTH PMN AND MONONUCLEAR     NO ORGANISMS SEEN     Performed at [facility]     Performed at [facility]   Culture     Final   Value: MODERATE METHICILLIN RESISTANT STAPHYLOCOCCUS AUREUS     Note: CRITICAL RESULT CALLED TO, READ BACK BY AND VERIFIED WITH: COURTNEY D @1421  05/04/13 BY KRAWS     Performed at [facility]   Report Status 05/04/2013 FINAL    Final   Organism ID, Bacteria METHICILLIN RESISTANT STAPHYLOCOCCUS AUREUS   Final  CULTURE, BLOOD (ROUTINE X 2)     Status: None   Collection Time    05/01/13 11:48 PM      Result Value Range Status   Specimen Description BLOOD LEFT HAND   Final   Special Requests BOTTLES DRAWN AEROBIC ONLY 8CC   Final   Culture  Setup Time     Final   Value: 05/02/2013 07:39     Performed at [facility]   Culture     Final   Value: STAPHYLOCOCCUS AUREUS     Note: SUSCEPTIBILITIES PERFORMED ON PREVIOUS CULTURE WITHIN THE LAST 5 DAYS.     Note: Gram Stain Report Called to,Read Back By and Verified With: PEACE DORMON ON 05/02/2013 AT 10:06P BY WILEJ     Performed at [facility]   Report Status 05/04/2013 FINAL   Final  CULTURE, BLOOD (ROUTINE X 2)     Status: None   Collection Time    05/03/13 12:33 PM      Result Value Range Status   Specimen Description BLOOD RIGHT ARM   Final   Special Requests BOTTLES DRAWN AEROBIC AND ANAEROBIC 5CC   Final   Culture  Setup Time     Final   Value: 05/03/2013 17:05     Performed at [facility]   Culture     Final   Value:        BLOOD CULTURE RECEIVED NO GROWTH TO DATE CULTURE WILL BE HELD FOR 5 DAYS BEFORE ISSUING A FINAL NEGATIVE REPORT     Performed at [facility]   Report Status PENDING   Incomplete  CULTURE, BLOOD (ROUTINE X 2)     Status: None   Collection Time    05/03/13 12:40 PM      Result Value Range Status   Specimen Description BLOOD LEFT HAND   Final   Special Requests BOTTLES DRAWN AEROBIC AND ANAEROBIC 5CC   Final   Culture  Setup Time     Final   Value: 05/03/2013 17:05     Performed at [facility]   Culture     Final   Value:  BLOOD CULTURE RECEIVED NO GROWTH TO DATE CULTURE WILL BE HELD FOR 5 DAYS BEFORE ISSUING A FINAL NEGATIVE REPORT     Performed at [facility]   Report Status PENDING   Incomplete  SURGICAL PCR SCREEN     Status: Abnormal   Collection Time    05/03/13  5:13  PM      Result Value Range Status   MRSA, PCR POSITIVE (*) NEGATIVE Final   Staphylococcus aureus POSITIVE (*) NEGATIVE Final   Comment:            The Xpert SA Assay (FDA     approved for NASAL specimens     in patients over 10 years of age),     is one component of     a comprehensive surveillance     program.  Test performance has     been validated by [employer] for patients greater     than or equal to 76 year old.     It is not intended     to diagnose infection nor to     guide or monitor treatment.    Studies/Results: Ct Shoulder Right W Contrast  05/05/2013   CLINICAL DATA:  Right shoulder pain in a patient with staph bacteremia.  EXAM: CT OF THE RIGHT SHOULDER WITH CONTRAST  TECHNIQUE: Multidetector CT imaging was performed following the standard protocol during bolus administration of intravenous contrast.  CONTRAST:  OMNIPAQUE IOHEXOL 300 MG/ML  SOLN  COMPARISON:  None.  FINDINGS: There is a small amount of fluid is subscapularis recess. No fluid within the glenohumeral joint is identified. No soft tissue gas collection is seen. There is no bony destructive change. Acromioclavicular osteoarthritis is noted. No focal bone lesion is identified. Imaged lung parenchyma demonstrates scattered atelectasis. As visualized CT scan, the rotator cuff appears intact.  IMPRESSION: No CT evidence of septic joint or osteomyelitis.  Acromioclavicular osteoarthritis.   Electronically Signed   By: Drusilla Kanner M.D.   On: 05/05/2013 11:27    Assessment/Plan: 1)  Disseminated MRSA bacteremia - Knee, shoulder, vitritious, chorioretinitis, blood, on vancomycin, will need prolonged course.   -TEE negative for vegetation -CT scan shoulder ok, lumbar pending -repeat blood cultures ngtd -will need 4 weeks of IV vancomycin with septic knee through Nov 11th -picc ok tomorrow 10/18 if blood culture from 10/15 remain negative  ADDENDUM:no discitis or other sites.  4 weeks of IV vanco.   Will follow up on Monday. Dr. Daiva Eves available over the weekend if needed.  Thanks.   Staci Righter, MD [email or web address] [phone] pager   [phone] cell 05/05/2013, 12:33 PM

## 2013-05-06 LAB — BASIC METABOLIC PANEL
Calcium: 8.5 mg/dL (ref 8.4–10.5)
Chloride: 97 mEq/L (ref 96–112)
Creatinine, Ser: 0.83 mg/dL (ref 0.50–1.35)
GFR calc Af Amer: >90 mL/min (ref >90)
GFR calc non Af Amer: >90 mL/min (ref >90)
Sodium: 139 mEq/L (ref 135–145)

## 2013-05-06 LAB — CBC
HCT: 41.3 % (ref 39.0–52.0)
Platelets: 354 10*3/uL (ref 150–400)
RDW: 15 % (ref 11.5–15.5)
WBC: 16.9 10*3/uL — ABNORMAL HIGH (ref 4.0–10.5)

## 2013-05-06 MED ORDER — CHLORHEXIDINE GLUCONATE CLOTH 2 % EX PADS: 6.0000 | MEDICATED_PAD | Freq: Once | CUTANEOUS | Status: DC

## 2013-05-06 MED ORDER — BISACODYL 10 MG RE SUPP
10.0000 mg | Freq: Once | RECTAL | Status: DC
  Filled 2013-05-06: qty 1

## 2013-05-06 NOTE — Progress Notes (Signed)
PATIENT DETAILS Name: Seth Martinez Age: 62 y.o. Sex: male Date of Birth: 1951-05-27 Admit Date: 05/01/2013 Admitting Physician Hillary Bow, DO GNF:AOZHY, Dorinda Hill, MD  Subjective: Admitted with fever, cough cold-for 1.5 weeks, then started having left knee pain swelling, right shoulder pain, right hip pain and right index finger pain/swelling.   Assessment/Plan: Principal Problem: Polyarticular Arthritis/ septic arthritis:  -following URI symptoms-did have one day of diarrhea. - Multiple joint pains associated with redness and swelling. He underwent arthrocentesis. The analysis showed Synovial fluid WBC 42070, gram stain of synovial fluid neg, synovial cx  Negative so far. . - Blood cultures show MRSA.  -will continue with IV Vanco for now, will need prolonged course.  -monitor and follow clinical course closely. - cardiology called for TEE  to evaluate for endocarditis, TEE done yesterday does not reveal any vegetations.  - CT shoulder and lumbar spine does not reveal any infection.  -  PICC ORDERED as his recent blood cultures are negative  - d/c telemetry.  - persistent left 2nd metacarpal joint swelling and pain.    left eye vision loss:  IMPROVING. Patient complained of sudden vision in the left eye . Ophthalmology consulted, followed by retina Specialist-  show vitritis possibly from the disseminated staph infection.  CT head did nto show any acute stroke.   Hypertension: Prn hydralazine.   DVT prophylaxis   Chronic Back pain -c/w supportive care  Disposition: Remain inpatient  DVT Prophylaxis: Prophylactic Heparin  Code Status: Full code   Family Communication -spouse at bedside Reports the left hand swelling is persistent.   Procedures:  Arthrocentesis 10/13  Arthroscopy   TEE  CONSULTS: ID OPHTHALMOLOGY Orthopedics cardiology   MEDICATIONS: Scheduled Meds: . bisacodyl  10 mg Rectal Once  . Chlorhexidine Gluconate Cloth  6 each  Topical Q0600  . Chlorhexidine Gluconate Cloth  6 each Topical Once  . heparin  5,000 Units Subcutaneous Q8H  . mupirocin ointment  1 application Nasal BID  . polyethylene glycol  17 g Oral BID  . senna  2 tablet Oral BID  . vancomycin  1,500 mg Intravenous Q12H   Continuous Infusions: . sodium chloride 50 mL/hr at 05/05/13 1952   PRN Meds:.acetaminophen, cyclobenzaprine, hydrALAZINE, oxyCODONE-acetaminophen  Antibiotics: Anti-infectives   Start     Dose/Rate Route Frequency Ordered Stop   05/04/13 1100  vancomycin (VANCOCIN) 1,500 mg in sodium chloride 0.9 % 500 mL IVPB     1,500 mg 250 mL/hr over 120 Minutes Intravenous Every 12 hours 05/04/13 1048     05/04/13 0750  gentamicin (GARAMYCIN) injection  Status:  Discontinued       As needed 05/04/13 0750 05/04/13 0831   05/04/13 0600  clindamycin (CLEOCIN) IVPB 900 mg     900 mg 100 mL/hr over 30 Minutes Intravenous On call to O.R. 05/03/13 1958 05/04/13 0728   05/03/13 1630  gentamicin (GARAMYCIN) injection 40 mg  Status:  Discontinued     40 mg Intramuscular  Once 05/03/13 1608 05/05/13 1310   05/02/13 1000  vancomycin (VANCOCIN) 1,500 mg in sodium chloride 0.9 % 500 mL IVPB  Status:  Discontinued     1,500 mg 250 mL/hr over 120 Minutes Intravenous Every 12 hours 05/02/13 0233 05/04/13 1048   05/02/13 0200  doxycycline (VIBRAMYCIN) 100 mg in dextrose 5 % 250 mL IVPB  Status:  Discontinued     100 mg 125 mL/hr over 120 Minutes Intravenous Every 12 hours 05/02/13 0148 05/03/13 1111   05/02/13 0000  vancomycin (  VANCOCIN) 2,500 mg in sodium chloride 0.9 % 500 mL IVPB     2,500 mg 250 mL/hr over 120 Minutes Intravenous  Once 05/01/13 2345 05/02/13 0212       PHYSICAL EXAM: Vital signs in last 24 hours: Filed Vitals:   05/05/13 1400 05/05/13 2124 05/06/13 0609 05/06/13 1326  BP: 156/74 156/84 164/89 164/79  Pulse: 76 76 80 86  Temp: 97.7 F (36.5 C) 97.8 F (36.6 C) 98.3 F (36.8 C) 98.4 F (36.9 C)  TempSrc: Oral Oral  Oral Oral  Resp: 20 20 18 18   Height:      Weight:      SpO2: 98% 97% 94% 93%    Weight change:  Filed Weights   05/01/13 2116 05/02/13 0102  Weight: 145.605 kg (321 lb) 147.9 kg (326 lb 1 oz)   Body mass index is 48.13 kg/(m^2).   Gen Exam: sleeping comfortably Chest: B/L Clear.  CVS: S1 S2 Regular, no murmurs.  Abdomen: soft, BS +, non tender, non distended.  Extremities: no edema, lower extremities warm to touch. Left Knee swollen/erythematous/tender, right groin tender, right shoulder pain, right index finger-proximal part swollen and tender/red(?sausafe digit)left ankle swollen.  Neurologic: Non Focal.   Skin: No Rash.    Intake/Output from previous day:  Intake/Output Summary (Last 24 hours) at 05/06/13 1506 Last data filed at 05/06/13 1327  Gross per 24 hour  Intake    240 ml  Output      0 ml  Net    240 ml     LAB RESULTS: CBC  Recent Labs Lab 04/30/13 1440 05/01/13 2215 05/02/13 0345 05/03/13 0605 05/05/13 0545 05/06/13 0618  WBC 15.4* 16.6* 14.2* 20.1* 17.8* 16.9*  HGB 14.9 13.4 13.2 14.1 13.8 13.8  HCT 42.5 38.7* 37.6* 42.6 41.1 41.3  PLT 185 199 176 241 292 354  MCV 86.4 86.0 86.6 88.8 90.5 91.0  MCH 30.3 29.8 30.4 29.4 30.4 30.4  MCHC 35.1 34.6 35.1 33.1 33.6 33.4  RDW 13.5 13.8 13.8 14.3 14.8 15.0  LYMPHSABS 0.7 0.9  --   --   --   --   MONOABS 1.6* 2.0*  --   --   --   --   EOSABS 0.0 0.0  --   --   --   --   BASOSABS 0.1 0.0  --   --   --   --     Chemistries   Recent Labs Lab 04/30/13 1440 05/02/13 0345 05/03/13 0605 05/05/13 0545 05/06/13 0618  NA 134* 135 138 139 139  K 4.0 3.3* 3.6 3.8 3.8  CL 96 99 97 99 97  CO2 21 24 25  33* 34*  GLUCOSE 137* 122* 110* 135* 121*  BUN 16 18 16 17 15   CREATININE 1.37* 1.20 1.08 0.97 0.83  CALCIUM 8.9 7.8* 8.5 8.3* 8.5    CBG:  Recent Labs Lab 05/04/13 0519 05/04/13 0841 05/05/13 0739 05/05/13 1136 05/05/13 1645  GLUCAP 152* 118* 130* 154* 87    GFR Estimated Creatinine  Clearance: 130.9 ml/min (by C-G formula based on Cr of 0.83).  Coagulation profile No results found for this basename: INR, PROTIME,  in the last 168 hours  Cardiac Enzymes No results found for this basename: CK, CKMB, TROPONINI, MYOGLOBIN,  in the last 168 hours  No components found with this basename: POCBNP,  No results found for this basename: DDIMER,  in the last 72 hours  Recent Labs  05/05/13 0545  HGBA1C 5.9*  No results found for this basename: CHOL, HDL, LDLCALC, TRIG, CHOLHDL, LDLDIRECT,  in the last 72 hours No results found for this basename: TSH, T4TOTAL, FREET3, T3FREE, THYROIDAB,  in the last 72 hours No results found for this basename: VITAMINB12, FOLATE, FERRITIN, TIBC, IRON, RETICCTPCT,  in the last 72 hours No results found for this basename: LIPASE, AMYLASE,  in the last 72 hours  Urine Studies No results found for this basename: UACOL, UAPR, USPG, UPH, UTP, UGL, UKET, UBIL, UHGB, UNIT, UROB, ULEU, UEPI, UWBC, URBC, UBAC, CAST, CRYS, UCOM, BILUA,  in the last 72 hours  MICROBIOLOGY: Recent Results (from the past 240 hour(s))  CULTURE, BLOOD (ROUTINE X 2)     Status: None   Collection Time    05/01/13 10:15 PM      Result Value Range Status   Specimen Description BLOOD FOREARM LEFT IV START   Final   Special Requests BOTTLES DRAWN AEROBIC AND ANAEROBIC 10CC EA   Final   Culture  Setup Time     Final   Value: 05/02/2013 07:40     Performed at [facility]   Culture     Final   Value: METHICILLIN RESISTANT STAPHYLOCOCCUS AUREUS     Note: RIFAMPIN AND GENTAMICIN SHOULD NOT BE USED AS SINGLE DRUGS FOR TREATMENT OF STAPH INFECTIONS. CRITICAL RESULT CALLED TO, READ BACK BY AND VERIFIED WITH: COURTNEY D @1421  05/04/13 BY KRAWS     Note: Gram Stain Report Called to,Read Back By and Verified With: PEACE DORMON ON 05/02/2013 AT 10:06P BY WILEJ     Performed at [facility]   Report Status 05/04/2013 FINAL   Final   Organism ID, Bacteria METHICILLIN  RESISTANT STAPHYLOCOCCUS AUREUS   Final  GRAM STAIN     Status: None   Collection Time    05/01/13 10:28 PM      Result Value Range Status   Specimen Description SYNOVIAL FLUID KNEE LEFT   Final   Special Requests Normal   Final   Gram Stain     Final   Value: ABUNDANT WBC PRESENT,BOTH PMN AND MONONUCLEAR     NO ORGANISMS SEEN   Report Status 05/01/2013 FINAL   Final  BODY FLUID CULTURE     Status: None   Collection Time    05/01/13 10:28 PM      Result Value Range Status   Specimen Description SYNOVIAL FLUID KNEE LEFT   Final   Special Requests Normal   Final   Gram Stain     Final   Value: ABUNDANT WBC PRESENT,BOTH PMN AND MONONUCLEAR     NO ORGANISMS SEEN     Performed at [facility]     Performed at [facility]   Culture     Final   Value: MODERATE METHICILLIN RESISTANT STAPHYLOCOCCUS AUREUS     Note: CRITICAL RESULT CALLED TO, READ BACK BY AND VERIFIED WITH: COURTNEY D @1421  05/04/13 BY KRAWS     Performed at [facility]   Report Status 05/04/2013 FINAL   Final   Organism ID, Bacteria METHICILLIN RESISTANT STAPHYLOCOCCUS AUREUS   Final  CULTURE, BLOOD (ROUTINE X 2)     Status: None   Collection Time    05/01/13 11:48 PM      Result Value Range Status   Specimen Description BLOOD LEFT HAND   Final   Special Requests BOTTLES DRAWN AEROBIC ONLY 8CC   Final   Culture  Setup Time  Final   Value: 05/02/2013 07:39     Performed at [facility]   Culture     Final   Value: STAPHYLOCOCCUS AUREUS     Note: SUSCEPTIBILITIES PERFORMED ON PREVIOUS CULTURE WITHIN THE LAST 5 DAYS.     Note: Gram Stain Report Called to,Read Back By and Verified With: PEACE DORMON ON 05/02/2013 AT 10:06P BY WILEJ     Performed at [facility]   Report Status 05/04/2013 FINAL   Final  CULTURE, BLOOD (ROUTINE X 2)     Status: None   Collection Time    05/03/13 12:33 PM      Result Value Range Status   Specimen Description BLOOD RIGHT ARM   Final   Special  Requests BOTTLES DRAWN AEROBIC AND ANAEROBIC 5CC   Final   Culture  Setup Time     Final   Value: 05/03/2013 17:05     Performed at [facility]   Culture     Final   Value:        BLOOD CULTURE RECEIVED NO GROWTH TO DATE CULTURE WILL BE HELD FOR 5 DAYS BEFORE ISSUING A FINAL NEGATIVE REPORT     Performed at [facility]   Report Status PENDING   Incomplete  CULTURE, BLOOD (ROUTINE X 2)     Status: None   Collection Time    05/03/13 12:40 PM      Result Value Range Status   Specimen Description BLOOD LEFT HAND   Final   Special Requests BOTTLES DRAWN AEROBIC AND ANAEROBIC 5CC   Final   Culture  Setup Time     Final   Value: 05/03/2013 17:05     Performed at [facility]   Culture     Final   Value:        BLOOD CULTURE RECEIVED NO GROWTH TO DATE CULTURE WILL BE HELD FOR 5 DAYS BEFORE ISSUING A FINAL NEGATIVE REPORT     Performed at [facility]   Report Status PENDING   Incomplete  SURGICAL PCR SCREEN     Status: Abnormal   Collection Time    05/03/13  5:13 PM      Result Value Range Status   MRSA, PCR POSITIVE (*) NEGATIVE Final   Staphylococcus aureus POSITIVE (*) NEGATIVE Final   Comment:            The Xpert SA Assay (FDA     approved for NASAL specimens     in patients over 57 years of age),     is one component of     a comprehensive surveillance     program.  Test performance has     been validated by [employer] for patients greater     than or equal to 68 year old.     It is not intended     to diagnose infection nor to     guide or monitor treatment.    RADIOLOGY STUDIES/RESULTS: Dg Chest 2 View  04/30/2013   CLINICAL DATA:  Back pain and cough.  EXAM: CHEST  2 VIEW  COMPARISON:  No priors.  FINDINGS: Lung volumes are normal. No consolidative airspace disease. No pleural effusions. No pneumothorax. No pulmonary nodule or mass noted. Pulmonary vasculature and the cardiomediastinal silhouette are within normal limits. Old healed  fracture of the distal right clavicle incidentally noted.  IMPRESSION: 1.  No radiographic evidence of acute cardiopulmonary disease.   Electronically  Signed   By: Trudie Reed M.D.   On: 04/30/2013 15:22   Dg Lumbar Spine 2-3 Views  04/30/2013   CLINICAL DATA:  Back pain.  EXAM: LUMBAR SPINE - 2-3 VIEW  COMPARISON:  None.  FINDINGS: There is marked convex right scoliosis. Multilevel degenerative change is present. The patient is status post L4-S1 fusion. Hardware appears intact. No fracture is identified.  IMPRESSION: No acute finding.  Multilevel degenerative change. Status post L4-S1 fusion.   Electronically Signed   By: Drusilla Kanner M.D.   On: 04/30/2013 16:56   Dg Hip Complete Right  04/30/2013   CLINICAL DATA:  Low back pain and right-sided hip pain.  EXAM: RIGHT HIP - COMPLETE 2+ VIEW  COMPARISON:  No priors.  FINDINGS: AP view of the pelvis and AP and lateral views of the right hip demonstrate no definite acute displaced fracture, subluxation, dislocation, joint or soft tissue abnormality. Orthopedic fixation hardware is noted at the lumbosacral junction, and the fixation screw in the low was position on the left appears fractured. No prior studies are available for comparison.  IMPRESSION: 1. No acute radiographic abnormality of the bony pelvis or the left hip. 2. Fracture of the inferior fixation screw on the left side in this patient status post PLIF at L4-S1. Whether or not this is an acute finding or has been present on prior outside studies is uncertain. Correlation with prior outside examinations is suggested.   Electronically Signed   By: Trudie Reed M.D.   On: 04/30/2013 16:50   Dg Knee 2 Views Left  05/02/2013   CLINICAL DATA:  Joint pain.  EXAM: LEFT KNEE - 1-2 VIEW  COMPARISON:  None.  FINDINGS: There is subcutaneous emphysema in a line along the medial knee, presumably from reported arthrocentesis. Small to moderate knee joint effusion, suprapatellar. No osteolysis. Mild  tricompartmental osteoarthritis, without focal or advanced joint narrowing. No fracture, malalignment, or other focal osseous abnormality.  IMPRESSION: 1. Mild to moderate volume knee joint effusion. 2. Mild tricompartmental osteoarthritis. 3. Subcutaneous gas, presumably from reported arthrocentesis.   Electronically Signed   By: Tiburcio Pea M.D.   On: 05/02/2013 00:08   Dg Hand 2 View Right  05/02/2013   CLINICAL DATA:  Pain, no injury  EXAM: RIGHT HAND - 2 VIEW  COMPARISON:  None.  FINDINGS: There is no evidence of fracture or dislocation. There is no evidence of arthropathy or other focal bone abnormality. Mild soft tissue swelling is present  IMPRESSION: No acute osseous abnormality.   Electronically Signed   By: Davonna Belling M.D.   On: 05/02/2013 00:09    Naly Schwanz, MD  [facility] Pager:[phone]  If 7PM-7AM, please contact night-coverage [email or web address] Password [email or web address] 05/06/2013, 3:06 PM   LOS: 5 days

## 2013-05-06 NOTE — Progress Notes (Signed)
ANTIBIOTIC CONSULT NOTE - FOLLOW UP  Pharmacy Consult for Vancomycin Indication: staph aureus bacteremia  Allergies  Allergen Reactions  . Morphine And Related Anaphylaxis  . Penicillins Anaphylaxis  . Amitriptyline     unk  . Antara [Fenofibrate Micronized]     unk  . Bextra [Valdecoxib]     unk  . Cefdinir     unk  . Celebrex [Celecoxib]     unk  . Crestor [Rosuvastatin Calcium]     unk  . Escitalopram Oxalate     unk  . Fish Oil     unk  . Ibuprofen     unk  . Naproxen Sodium     unk  . Neurontin [Gabapentin]     unk  . Propoxyphene And Methadone     unk  . Skelaxin     unk  . Ultram [Tramadol]     unk  . Vioxx [Rofecoxib]     unk  . Zocor [Simvastatin]     unk    Patient Measurements: Height: 5\' 9"  (175.3 cm) Weight: 326 lb 1 oz (147.9 kg) IBW/kg (Calculated) : 70.7  Vital Signs: Temp: 98.3 F (36.8 C) (10/18 0609) Temp src: Oral (10/18 0609) BP: 164/89 mmHg (10/18 0609) Pulse Rate: 80 (10/18 0609) Intake/Output from previous day: 10/17 0701 - 10/18 0700 In: 600 [P.O.:600] Out: 600 [Urine:600] Intake/Output from this shift:    Labs:  Recent Labs  05/05/13 0545 05/06/13 0618  WBC 17.8* 16.9*  HGB 13.8 13.8  PLT 292 354  CREATININE 0.97 0.83   Estimated Creatinine Clearance: 130.9 ml/min (by C-G formula based on Cr of 0.83). No results found for this basename: VANCOTROUGH, VANCOPEAK, VANCORANDOM, GENTTROUGH, GENTPEAK, GENTRANDOM, TOBRATROUGH, TOBRAPEAK, TOBRARND, AMIKACINPEAK, AMIKACINTROU, AMIKACIN,  in the last 72 hours   Assessment: 62 yo male with staph aureus bacteremia started on Vanc. SCr is 0.83 and CrCl >100. ID is following and pt is Vanc D#5 for septic arthritis of multiple joints.  Pt is now afebrile, and WBC has dec to 16.9 from 20.1.    10/14 doxy>> 10/15 10/14 vanc>>   10/13 synovial fluid- moderate MRSA (vanc MIC <0.5) 10/13 blood x2- MRSA (vanc MIC 1) 10/15 Blood - NGTD   Plan:  -Continue Vanc 1500 mg IV  Q12H -Will follow renal function, culture ID and clinical progress -VT today at 2230, before Vanc dose at 2300  Anabel Bene, PharmD Clinical Pharmacist Pager: [phone]

## 2013-05-06 NOTE — Progress Notes (Signed)
Subjective: 2 Days Post-Op Procedure(s) (LRB): TRANSESOPHAGEAL ECHOCARDIOGRAM (TEE) (N/A) Patient reports pain as 3 on 0-10 scale.    Objective: Vital signs in last 24 hours: Temp:  [97.7 F (36.5 C)-98.3 F (36.8 C)] 98.3 F (36.8 C) (10/18 0609) Pulse Rate:  [76-80] 80 (10/18 0609) Resp:  [18-20] 18 (10/18 0609) BP: (156-164)/(74-89) 164/89 mmHg (10/18 0609) SpO2:  [94 %-98 %] 94 % (10/18 0609)  Intake/Output from previous day: 10/17 0701 - 10/18 0700 In: 600 [P.O.:600] Out: 600 [Urine:600] Intake/Output this shift:     Recent Labs  05/05/13 0545 05/06/13 0618  HGB 13.8 13.8    Recent Labs  05/05/13 0545 05/06/13 0618  WBC 17.8* 16.9*  RBC 4.54 4.54  HCT 41.1 41.3  PLT 292 354    Recent Labs  05/05/13 0545 05/06/13 0618  NA 139 139  K 3.8 3.8  CL 99 97  CO2 33* 34*  BUN 17 15  CREATININE 0.97 0.83  GLUCOSE 135* 121*  CALCIUM 8.3* 8.5   No results found for this basename: LABPT, INR,  in the last 72 hours  ABD soft Neurovascular intact Sensation intact distally Intact pulses distally Dorsiflexion/Plantar flexion intact Incision: scant drainage  Assessment/Plan: 2 Days Post-Op Procedure(s) (LRB): TRANSESOPHAGEAL ECHOCARDIOGRAM (TEE) (N/A) Advance diet Up with therapy Will add a dulcolax suppository to help with constipation  Seth Martinez J 05/06/2013, 9:46 AM

## 2013-05-07 ENCOUNTER — Inpatient Hospital Stay (HOSPITAL_COMMUNITY): Payer: Medicare Other

## 2013-05-07 LAB — URINALYSIS, ROUTINE W REFLEX MICROSCOPIC
Bilirubin Urine: NEGATIVE
Hgb urine dipstick: NEGATIVE
Nitrite: NEGATIVE
Protein, ur: NEGATIVE mg/dL
Specific Gravity, Urine: 1.012 (ref 1.005–1.030)
Urobilinogen, UA: 2 mg/dL — ABNORMAL HIGH (ref 0.0–1.0)

## 2013-05-07 LAB — BASIC METABOLIC PANEL
BUN: 12 mg/dL (ref 6–23)
Calcium: 8.2 mg/dL — ABNORMAL LOW (ref 8.4–10.5)
GFR calc Af Amer: >90 mL/min (ref >90)
GFR calc non Af Amer: >90 mL/min (ref >90)
Potassium: 3.7 mEq/L (ref 3.5–5.1)
Sodium: 135 mEq/L (ref 135–145)

## 2013-05-07 LAB — VANCOMYCIN, TROUGH: Vancomycin Tr: 8.7 ug/mL — ABNORMAL LOW (ref 10.0–20.0)

## 2013-05-07 MED ORDER — VANCOMYCIN HCL 10 G IV SOLR
1250.0000 mg | Freq: Three times a day (TID) | INTRAVENOUS | Status: DC
  Administered 2013-05-07 – 2013-05-10 (×10): 1250 mg via INTRAVENOUS
  Filled 2013-05-07 (×13): qty 1250

## 2013-05-07 MED ORDER — AMLODIPINE BESYLATE 10 MG PO TABS
10.0000 mg | ORAL_TABLET | Freq: Every day | ORAL | Status: DC
  Administered 2013-05-07 – 2013-05-10 (×4): 10 mg via ORAL
  Filled 2013-05-07 (×4): qty 1

## 2013-05-07 MED ORDER — CYCLOBENZAPRINE HCL 10 MG PO TABS: 5.0000 mg | ORAL_TABLET | Freq: Three times a day (TID) | ORAL | Status: DC | PRN

## 2013-05-07 NOTE — Progress Notes (Signed)
PATIENT DETAILS Name: Seth Martinez Age: 62 y.o. Sex: male Date of Birth: 1951-02-21 Admit Date: 05/01/2013 Admitting Physician Hillary Bow, DO XBM:WUXLK, Dorinda Hill, MD  Subjective: Admitted with fever, cough cold-for 1.5 weeks, then started having left knee pain swelling, right shoulder pain, right hip pain and right index finger pain/swelling.   Assessment/Plan: Principal Problem: Polyarticular Arthritis/ septic arthritis:  -following URI symptoms-did have one day of diarrhea. - Multiple joint pains associated with redness and swelling. He underwent arthrocentesis. The analysis showed Synovial fluid WBC 42070, gram stain of synovial fluid neg, synovial cx  Negative so far. . - Blood cultures show MRSA.  -will continue with IV Vanco for now, will need prolonged course.  -monitor and follow clinical course closely. - cardiology called for TEE  to evaluate for endocarditis, TEE done yesterday does not reveal any vegetations.  - CT shoulder and lumbar spine does not reveal any infection.  -  PICC in am,  as his recent blood cultures are negative  - d/c telemetry.  - persistent left 2nd metacarpal joint swelling and pain.  - MRI of the right hand to evaluate for osteomyelitis.  - aspiration of the right shoulder tomorrow.  - fever overnight, will get CXR and urine analysis.     left eye vision loss:  IMPROVING. Patient complained of sudden vision in the left eye . Ophthalmology consulted, followed by retina Specialist-  show vitritis possibly from the disseminated staph infection.  CT head did nto show any acute stroke.   Hypertension: Prn hydralazine.   DVT prophylaxis   Chronic Back pain -c/w supportive care  Disposition: Remain inpatient  DVT Prophylaxis: Prophylactic Heparin  Code Status: Full code   Family Communication -spouse at bedside Reports the left hand swelling is persistent.   Procedures:  Arthrocentesis 10/13  Arthroscopy    TEE  CONSULTS: ID OPHTHALMOLOGY Orthopedics cardiology   MEDICATIONS: Scheduled Meds: . amLODipine  10 mg Oral Daily  . bisacodyl  10 mg Rectal Once  . Chlorhexidine Gluconate Cloth  6 each Topical Q0600  . Chlorhexidine Gluconate Cloth  6 each Topical Once  . heparin  5,000 Units Subcutaneous Q8H  . mupirocin ointment  1 application Nasal BID  . polyethylene glycol  17 g Oral BID  . senna  2 tablet Oral BID  . vancomycin  1,250 mg Intravenous Q8H   Continuous Infusions:   PRN Meds:.acetaminophen, cyclobenzaprine, hydrALAZINE, oxyCODONE-acetaminophen  Antibiotics: Anti-infectives   Start     Dose/Rate Route Frequency Ordered Stop   05/07/13 0700  vancomycin (VANCOCIN) 1,250 mg in sodium chloride 0.9 % 250 mL IVPB     1,250 mg 166.7 mL/hr over 90 Minutes Intravenous Every 8 hours 05/07/13 0024     05/04/13 1100  vancomycin (VANCOCIN) 1,500 mg in sodium chloride 0.9 % 500 mL IVPB  Status:  Discontinued     1,500 mg 250 mL/hr over 120 Minutes Intravenous Every 12 hours 05/04/13 1048 05/07/13 0024   05/04/13 0750  gentamicin (GARAMYCIN) injection  Status:  Discontinued       As needed 05/04/13 0750 05/04/13 0831   05/04/13 0600  clindamycin (CLEOCIN) IVPB 900 mg     900 mg 100 mL/hr over 30 Minutes Intravenous On call to O.R. 05/03/13 1958 05/04/13 0728   05/03/13 1630  gentamicin (GARAMYCIN) injection 40 mg  Status:  Discontinued     40 mg Intramuscular  Once 05/03/13 1608 05/05/13 1310   05/02/13 1000  vancomycin (VANCOCIN) 1,500 mg in sodium chloride 0.9 %  500 mL IVPB  Status:  Discontinued     1,500 mg 250 mL/hr over 120 Minutes Intravenous Every 12 hours 05/02/13 0233 05/04/13 1048   05/02/13 0200  doxycycline (VIBRAMYCIN) 100 mg in dextrose 5 % 250 mL IVPB  Status:  Discontinued     100 mg 125 mL/hr over 120 Minutes Intravenous Every 12 hours 05/02/13 0148 05/03/13 1111   05/02/13 0000  vancomycin (VANCOCIN) 2,500 mg in sodium chloride 0.9 % 500 mL IVPB     2,500  mg 250 mL/hr over 120 Minutes Intravenous  Once 05/01/13 2345 05/02/13 0212       PHYSICAL EXAM: Vital signs in last 24 hours: Filed Vitals:   05/07/13 0050 05/07/13 0200 05/07/13 0612 05/07/13 0839  BP: 185/95 160/85 169/102 177/82  Pulse: 85 82 90 70  Temp: 99.5 F (37.5 C)  101.1 F (38.4 C) 98.4 F (36.9 C)  TempSrc: Oral  Oral Oral  Resp: 28  20 20   Height:      Weight:      SpO2: 94%  96% 95%    Weight change:  Filed Weights   05/01/13 2116 05/02/13 0102  Weight: 145.605 kg (321 lb) 147.9 kg (326 lb 1 oz)   Body mass index is 48.13 kg/(m^2).   Gen Exam: sleeping comfortably Chest: B/L Clear.  CVS: S1 S2 Regular, no murmurs.  Abdomen: soft, BS +, non tender, non distended.  Extremities: no edema, lower extremities warm to touch. Left Knee swollen/erythematous/tender, right groin tender, right shoulder pain, right index finger-proximal part swollen and tender/red(?sausafe digit)left ankle swollen.  Neurologic: Non Focal.   Skin: No Rash.    Intake/Output from previous day:  Intake/Output Summary (Last 24 hours) at 05/07/13 1436 Last data filed at 05/07/13 0556  Gross per 24 hour  Intake      0 ml  Output   2125 ml  Net  -2125 ml     LAB RESULTS: CBC  Recent Labs Lab 04/30/13 1440 05/01/13 2215 05/02/13 0345 05/03/13 0605 05/05/13 0545 05/06/13 0618  WBC 15.4* 16.6* 14.2* 20.1* 17.8* 16.9*  HGB 14.9 13.4 13.2 14.1 13.8 13.8  HCT 42.5 38.7* 37.6* 42.6 41.1 41.3  PLT 185 199 176 241 292 354  MCV 86.4 86.0 86.6 88.8 90.5 91.0  MCH 30.3 29.8 30.4 29.4 30.4 30.4  MCHC 35.1 34.6 35.1 33.1 33.6 33.4  RDW 13.5 13.8 13.8 14.3 14.8 15.0  LYMPHSABS 0.7 0.9  --   --   --   --   MONOABS 1.6* 2.0*  --   --   --   --   EOSABS 0.0 0.0  --   --   --   --   BASOSABS 0.1 0.0  --   --   --   --     Chemistries   Recent Labs Lab 05/02/13 0345 05/03/13 0605 05/05/13 0545 05/06/13 0618 05/07/13 0514  NA 135 138 139 139 135  K 3.3* 3.6 3.8 3.8 3.7  CL  99 97 99 97 94*  CO2 24 25 33* 34* 31  GLUCOSE 122* 110* 135* 121* 117*  BUN 18 16 17 15 12   CREATININE 1.20 1.08 0.97 0.83 0.81  CALCIUM 7.8* 8.5 8.3* 8.5 8.2*    CBG:  Recent Labs Lab 05/04/13 0519 05/04/13 0841 05/05/13 0739 05/05/13 1136 05/05/13 1645  GLUCAP 152* 118* 130* 154* 87    GFR Estimated Creatinine Clearance: 134.1 ml/min (by C-G formula based on Cr of 0.81).  Coagulation profile No  results found for this basename: INR, PROTIME,  in the last 168 hours  Cardiac Enzymes No results found for this basename: CK, CKMB, TROPONINI, MYOGLOBIN,  in the last 168 hours  No components found with this basename: POCBNP,  No results found for this basename: DDIMER,  in the last 72 hours  Recent Labs  05/05/13 0545  HGBA1C 5.9*   No results found for this basename: CHOL, HDL, LDLCALC, TRIG, CHOLHDL, LDLDIRECT,  in the last 72 hours No results found for this basename: TSH, T4TOTAL, FREET3, T3FREE, THYROIDAB,  in the last 72 hours No results found for this basename: VITAMINB12, FOLATE, FERRITIN, TIBC, IRON, RETICCTPCT,  in the last 72 hours No results found for this basename: LIPASE, AMYLASE,  in the last 72 hours  Urine Studies No results found for this basename: UACOL, UAPR, USPG, UPH, UTP, UGL, UKET, UBIL, UHGB, UNIT, UROB, ULEU, UEPI, UWBC, URBC, UBAC, CAST, CRYS, UCOM, BILUA,  in the last 72 hours  MICROBIOLOGY: Recent Results (from the past 240 hour(s))  CULTURE, BLOOD (ROUTINE X 2)     Status: None   Collection Time    05/01/13 10:15 PM      Result Value Range Status   Specimen Description BLOOD FOREARM LEFT IV START   Final   Special Requests BOTTLES DRAWN AEROBIC AND ANAEROBIC 10CC EA   Final   Culture  Setup Time     Final   Value: 05/02/2013 07:40     Performed at [facility]   Culture     Final   Value: METHICILLIN RESISTANT STAPHYLOCOCCUS AUREUS     Note: RIFAMPIN AND GENTAMICIN SHOULD NOT BE USED AS SINGLE DRUGS FOR TREATMENT OF STAPH  INFECTIONS. CRITICAL RESULT CALLED TO, READ BACK BY AND VERIFIED WITH: COURTNEY D @1421  05/04/13 BY KRAWS     Note: Gram Stain Report Called to,Read Back By and Verified With: PEACE DORMON ON 05/02/2013 AT 10:06P BY WILEJ     Performed at [facility]   Report Status 05/04/2013 FINAL   Final   Organism ID, Bacteria METHICILLIN RESISTANT STAPHYLOCOCCUS AUREUS   Final  GRAM STAIN     Status: None   Collection Time    05/01/13 10:28 PM      Result Value Range Status   Specimen Description SYNOVIAL FLUID KNEE LEFT   Final   Special Requests Normal   Final   Gram Stain     Final   Value: ABUNDANT WBC PRESENT,BOTH PMN AND MONONUCLEAR     NO ORGANISMS SEEN   Report Status 05/01/2013 FINAL   Final  BODY FLUID CULTURE     Status: None   Collection Time    05/01/13 10:28 PM      Result Value Range Status   Specimen Description SYNOVIAL FLUID KNEE LEFT   Final   Special Requests Normal   Final   Gram Stain     Final   Value: ABUNDANT WBC PRESENT,BOTH PMN AND MONONUCLEAR     NO ORGANISMS SEEN     Performed at [facility]     Performed at [facility]   Culture     Final   Value: MODERATE METHICILLIN RESISTANT STAPHYLOCOCCUS AUREUS     Note: CRITICAL RESULT CALLED TO, READ BACK BY AND VERIFIED WITH: COURTNEY D @1421  05/04/13 BY KRAWS     Performed at [facility]   Report Status 05/04/2013 FINAL   Final   Organism ID, Bacteria METHICILLIN RESISTANT STAPHYLOCOCCUS AUREUS  Final  CULTURE, BLOOD (ROUTINE X 2)     Status: None   Collection Time    05/01/13 11:48 PM      Result Value Range Status   Specimen Description BLOOD LEFT HAND   Final   Special Requests BOTTLES DRAWN AEROBIC ONLY 8CC   Final   Culture  Setup Time     Final   Value: 05/02/2013 07:39     Performed at [facility]   Culture     Final   Value: STAPHYLOCOCCUS AUREUS     Note: SUSCEPTIBILITIES PERFORMED ON PREVIOUS CULTURE WITHIN THE LAST 5 DAYS.     Note: Gram Stain Report Called  to,Read Back By and Verified With: PEACE DORMON ON 05/02/2013 AT 10:06P BY WILEJ     Performed at [facility]   Report Status 05/04/2013 FINAL   Final  CULTURE, BLOOD (ROUTINE X 2)     Status: None   Collection Time    05/03/13 12:33 PM      Result Value Range Status   Specimen Description BLOOD RIGHT ARM   Final   Special Requests BOTTLES DRAWN AEROBIC AND ANAEROBIC 5CC   Final   Culture  Setup Time     Final   Value: 05/03/2013 17:05     Performed at [facility]   Culture     Final   Value:        BLOOD CULTURE RECEIVED NO GROWTH TO DATE CULTURE WILL BE HELD FOR 5 DAYS BEFORE ISSUING A FINAL NEGATIVE REPORT     Performed at [facility]   Report Status PENDING   Incomplete  CULTURE, BLOOD (ROUTINE X 2)     Status: None   Collection Time    05/03/13 12:40 PM      Result Value Range Status   Specimen Description BLOOD LEFT HAND   Final   Special Requests BOTTLES DRAWN AEROBIC AND ANAEROBIC 5CC   Final   Culture  Setup Time     Final   Value: 05/03/2013 17:05     Performed at [facility]   Culture     Final   Value:        BLOOD CULTURE RECEIVED NO GROWTH TO DATE CULTURE WILL BE HELD FOR 5 DAYS BEFORE ISSUING A FINAL NEGATIVE REPORT     Performed at [facility]   Report Status PENDING   Incomplete  SURGICAL PCR SCREEN     Status: Abnormal   Collection Time    05/03/13  5:13 PM      Result Value Range Status   MRSA, PCR POSITIVE (*) NEGATIVE Final   Staphylococcus aureus POSITIVE (*) NEGATIVE Final   Comment:            The Xpert SA Assay (FDA     approved for NASAL specimens     in patients over 45 years of age),     is one component of     a comprehensive surveillance     program.  Test performance has     been validated by [employer] for patients greater     than or equal to 3 year old.     It is not intended     to diagnose infection nor to     guide or monitor treatment.    RADIOLOGY STUDIES/RESULTS: Dg Chest 2  View  04/30/2013   CLINICAL DATA:  Back pain and cough.  EXAM:  CHEST  2 VIEW  COMPARISON:  No priors.  FINDINGS: Lung volumes are normal. No consolidative airspace disease. No pleural effusions. No pneumothorax. No pulmonary nodule or mass noted. Pulmonary vasculature and the cardiomediastinal silhouette are within normal limits. Old healed fracture of the distal right clavicle incidentally noted.  IMPRESSION: 1.  No radiographic evidence of acute cardiopulmonary disease.   Electronically Signed   By: Trudie Reed M.D.   On: 04/30/2013 15:22   Dg Lumbar Spine 2-3 Views  04/30/2013   CLINICAL DATA:  Back pain.  EXAM: LUMBAR SPINE - 2-3 VIEW  COMPARISON:  None.  FINDINGS: There is marked convex right scoliosis. Multilevel degenerative change is present. The patient is status post L4-S1 fusion. Hardware appears intact. No fracture is identified.  IMPRESSION: No acute finding.  Multilevel degenerative change. Status post L4-S1 fusion.   Electronically Signed   By: Drusilla Kanner M.D.   On: 04/30/2013 16:56   Dg Hip Complete Right  04/30/2013   CLINICAL DATA:  Low back pain and right-sided hip pain.  EXAM: RIGHT HIP - COMPLETE 2+ VIEW  COMPARISON:  No priors.  FINDINGS: AP view of the pelvis and AP and lateral views of the right hip demonstrate no definite acute displaced fracture, subluxation, dislocation, joint or soft tissue abnormality. Orthopedic fixation hardware is noted at the lumbosacral junction, and the fixation screw in the low was position on the left appears fractured. No prior studies are available for comparison.  IMPRESSION: 1. No acute radiographic abnormality of the bony pelvis or the left hip. 2. Fracture of the inferior fixation screw on the left side in this patient status post PLIF at L4-S1. Whether or not this is an acute finding or has been present on prior outside studies is uncertain. Correlation with prior outside examinations is suggested.   Electronically Signed   By: Trudie Reed M.D.   On: 04/30/2013 16:50   Dg Knee 2 Views Left  05/02/2013   CLINICAL DATA:  Joint pain.  EXAM: LEFT KNEE - 1-2 VIEW  COMPARISON:  None.  FINDINGS: There is subcutaneous emphysema in a line along the medial knee, presumably from reported arthrocentesis. Small to moderate knee joint effusion, suprapatellar. No osteolysis. Mild tricompartmental osteoarthritis, without focal or advanced joint narrowing. No fracture, malalignment, or other focal osseous abnormality.  IMPRESSION: 1. Mild to moderate volume knee joint effusion. 2. Mild tricompartmental osteoarthritis. 3. Subcutaneous gas, presumably from reported arthrocentesis.   Electronically Signed   By: Tiburcio Pea M.D.   On: 05/02/2013 00:08   Dg Hand 2 View Right  05/02/2013   CLINICAL DATA:  Pain, no injury  EXAM: RIGHT HAND - 2 VIEW  COMPARISON:  None.  FINDINGS: There is no evidence of fracture or dislocation. There is no evidence of arthropathy or other focal bone abnormality. Mild soft tissue swelling is present  IMPRESSION: No acute osseous abnormality.   Electronically Signed   By: Davonna Belling M.D.   On: 05/02/2013 00:09    Ayoub Arey, MD  [facility] Pager:[phone]  If 7PM-7AM, please contact night-coverage [email or web address] Password [email or web address] 05/07/2013, 2:36 PM   LOS: 6 days

## 2013-05-07 NOTE — Progress Notes (Signed)
Dr. Blake Divine returned page. Cancelled order for PICC placement due to fevers. MD to evaluate potential meds for HTN. Will continue to monitor.

## 2013-05-07 NOTE — Progress Notes (Signed)
[facility]    Subjective: Still having fevers, vision stable to improved, left shoulder still painful, as is right hand fingers   Antibiotics:  Anti-infectives   Start     Dose/Rate Route Frequency Ordered Stop   05/07/13 0700  vancomycin (VANCOCIN) 1,250 mg in sodium chloride 0.9 % 250 mL IVPB     1,250 mg 166.7 mL/hr over 90 Minutes Intravenous Every 8 hours 05/07/13 0024     05/04/13 1100  vancomycin (VANCOCIN) 1,500 mg in sodium chloride 0.9 % 500 mL IVPB  Status:  Discontinued     1,500 mg 250 mL/hr over 120 Minutes Intravenous Every 12 hours 05/04/13 1048 05/07/13 0024   05/04/13 0750  gentamicin (GARAMYCIN) injection  Status:  Discontinued       As needed 05/04/13 0750 05/04/13 0831   05/04/13 0600  clindamycin (CLEOCIN) IVPB 900 mg     900 mg 100 mL/hr over 30 Minutes Intravenous On call to O.R. 05/03/13 1958 05/04/13 0728   05/03/13 1630  gentamicin (GARAMYCIN) injection 40 mg  Status:  Discontinued     40 mg Intramuscular  Once 05/03/13 1608 05/05/13 1310   05/02/13 1000  vancomycin (VANCOCIN) 1,500 mg in sodium chloride 0.9 % 500 mL IVPB  Status:  Discontinued     1,500 mg 250 mL/hr over 120 Minutes Intravenous Every 12 hours 05/02/13 0233 05/04/13 1048   05/02/13 0200  doxycycline (VIBRAMYCIN) 100 mg in dextrose 5 % 250 mL IVPB  Status:  Discontinued     100 mg 125 mL/hr over 120 Minutes Intravenous Every 12 hours 05/02/13 0148 05/03/13 1111   05/02/13 0000  vancomycin (VANCOCIN) 2,500 mg in sodium chloride 0.9 % 500 mL IVPB     2,500 mg 250 mL/hr over 120 Minutes Intravenous  Once 05/01/13 2345 05/02/13 0212      Medications: Scheduled Meds: . amLODipine  10 mg Oral Daily  . bisacodyl  10 mg Rectal Once  . Chlorhexidine Gluconate Cloth  6 each Topical Q0600  . Chlorhexidine Gluconate Cloth  6 each Topical Once  . heparin  5,000 Units Subcutaneous Q8H  . mupirocin ointment  1 application Nasal BID  . polyethylene glycol  17 g  Oral BID  . senna  2 tablet Oral BID  . vancomycin  1,250 mg Intravenous Q8H   Continuous Infusions:  PRN Meds:.acetaminophen, cyclobenzaprine, hydrALAZINE, oxyCODONE-acetaminophen   Objective: Weight change:   Intake/Output Summary (Last 24 hours) at 05/07/13 1602 Last data filed at 05/07/13 1454  Gross per 24 hour  Intake    180 ml  Output   2575 ml  Net  -2395 ml   Blood pressure 159/81, pulse 90, temperature 99 F (37.2 C), temperature source Axillary, resp. rate 20, height 5\' 9"  (1.753 m), weight 326 lb 1 oz (147.9 kg), SpO2 94.00%. Temp:  [98.4 F (36.9 C)-101.1 F (38.4 C)] 99 F (37.2 C) (10/19 1454) Pulse Rate:  [70-90] 90 (10/19 1454) Resp:  [20-29] 20 (10/19 1454) BP: (159-188)/(68-102) 159/81 mmHg (10/19 1454) SpO2:  [93 %-97 %] 94 % (10/19 1454)  Physical Exam: General: Alert and awake, oriented x3, not in any acute distress. HEENT: anicteric sclera, EOMI, vision impaired in left eye CVS regular rate, normal r,  no murmur rubs or gallops Chest: clear to auscultation bilaterally, no wheezing, rales or rhonchi Abdomen: soft nontender, nondistended, normal bowel sounds, Extremities/MSK:  Right shoulder tender to deep palpation, elevation, abduction limited vs left, right finger is exquisitely tender to  palpation and erythema  Left knee is wrapped   Neuro: nonfocal  Lab Results:  Recent Labs  05/05/13 0545 05/06/13 0618  WBC 17.8* 16.9*  HGB 13.8 13.8  HCT 41.1 41.3  PLT 292 354    BMET  Recent Labs  05/06/13 0618 05/07/13 0514  NA 139 135  K 3.8 3.7  CL 97 94*  CO2 34* 31  GLUCOSE 121* 117*  BUN 15 12  CREATININE 0.83 0.81  CALCIUM 8.5 8.2*    Micro Results: Recent Results (from the past 240 hour(s))  CULTURE, BLOOD (ROUTINE X 2)     Status: None   Collection Time    05/01/13 10:15 PM      Result Value Range Status   Specimen Description BLOOD FOREARM LEFT IV START   Final   Special Requests BOTTLES DRAWN AEROBIC AND ANAEROBIC  10CC EA   Final   Culture  Setup Time     Final   Value: 05/02/2013 07:40     Performed at [facility]   Culture     Final   Value: METHICILLIN RESISTANT STAPHYLOCOCCUS AUREUS     Note: RIFAMPIN AND GENTAMICIN SHOULD NOT BE USED AS SINGLE DRUGS FOR TREATMENT OF STAPH INFECTIONS. CRITICAL RESULT CALLED TO, READ BACK BY AND VERIFIED WITH: COURTNEY D @1421  05/04/13 BY KRAWS     Note: Gram Stain Report Called to,Read Back By and Verified With: PEACE DORMON ON 05/02/2013 AT 10:06P BY WILEJ     Performed at [facility]   Report Status 05/04/2013 FINAL   Final   Organism ID, Bacteria METHICILLIN RESISTANT STAPHYLOCOCCUS AUREUS   Final  GRAM STAIN     Status: None   Collection Time    05/01/13 10:28 PM      Result Value Range Status   Specimen Description SYNOVIAL FLUID KNEE LEFT   Final   Special Requests Normal   Final   Gram Stain     Final   Value: ABUNDANT WBC PRESENT,BOTH PMN AND MONONUCLEAR     NO ORGANISMS SEEN   Report Status 05/01/2013 FINAL   Final  BODY FLUID CULTURE     Status: None   Collection Time    05/01/13 10:28 PM      Result Value Range Status   Specimen Description SYNOVIAL FLUID KNEE LEFT   Final   Special Requests Normal   Final   Gram Stain     Final   Value: ABUNDANT WBC PRESENT,BOTH PMN AND MONONUCLEAR     NO ORGANISMS SEEN     Performed at [facility]     Performed at [facility]   Culture     Final   Value: MODERATE METHICILLIN RESISTANT STAPHYLOCOCCUS AUREUS     Note: CRITICAL RESULT CALLED TO, READ BACK BY AND VERIFIED WITH: COURTNEY D @1421  05/04/13 BY KRAWS     Performed at [facility]   Report Status 05/04/2013 FINAL   Final   Organism ID, Bacteria METHICILLIN RESISTANT STAPHYLOCOCCUS AUREUS   Final  CULTURE, BLOOD (ROUTINE X 2)     Status: None   Collection Time    05/01/13 11:48 PM      Result Value Range Status   Specimen Description BLOOD LEFT HAND   Final   Special Requests BOTTLES DRAWN AEROBIC ONLY  8CC   Final   Culture  Setup Time     Final   Value: 05/02/2013 07:39     Performed at [facility]  Culture     Final   Value: STAPHYLOCOCCUS AUREUS     Note: SUSCEPTIBILITIES PERFORMED ON PREVIOUS CULTURE WITHIN THE LAST 5 DAYS.     Note: Gram Stain Report Called to,Read Back By and Verified With: PEACE DORMON ON 05/02/2013 AT 10:06P BY WILEJ     Performed at [facility]   Report Status 05/04/2013 FINAL   Final  CULTURE, BLOOD (ROUTINE X 2)     Status: None   Collection Time    05/03/13 12:33 PM      Result Value Range Status   Specimen Description BLOOD RIGHT ARM   Final   Special Requests BOTTLES DRAWN AEROBIC AND ANAEROBIC 5CC   Final   Culture  Setup Time     Final   Value: 05/03/2013 17:05     Performed at [facility]   Culture     Final   Value:        BLOOD CULTURE RECEIVED NO GROWTH TO DATE CULTURE WILL BE HELD FOR 5 DAYS BEFORE ISSUING A FINAL NEGATIVE REPORT     Performed at [facility]   Report Status PENDING   Incomplete  CULTURE, BLOOD (ROUTINE X 2)     Status: None   Collection Time    05/03/13 12:40 PM      Result Value Range Status   Specimen Description BLOOD LEFT HAND   Final   Special Requests BOTTLES DRAWN AEROBIC AND ANAEROBIC 5CC   Final   Culture  Setup Time     Final   Value: 05/03/2013 17:05     Performed at [facility]   Culture     Final   Value:        BLOOD CULTURE RECEIVED NO GROWTH TO DATE CULTURE WILL BE HELD FOR 5 DAYS BEFORE ISSUING A FINAL NEGATIVE REPORT     Performed at [facility]   Report Status PENDING   Incomplete  SURGICAL PCR SCREEN     Status: Abnormal   Collection Time    05/03/13  5:13 PM      Result Value Range Status   MRSA, PCR POSITIVE (*) NEGATIVE Final   Staphylococcus aureus POSITIVE (*) NEGATIVE Final   Comment:            The Xpert SA Assay (FDA     approved for NASAL specimens     in patients over 103 years of age),     is one component of     a comprehensive  surveillance     program.  Test performance has     been validated by [employer] for patients greater     than or equal to 6 year old.     It is not intended     to diagnose infection nor to     guide or monitor treatment.    Studies/Results: No results found.    Assessment/Plan: Seth Martinez is a 62 y.o. male with  Disseminated MRSA bacteremia with septic left knee sp I and D, chorioretinitis, ? Shoulder and hand infection with persistent fevers. TEE without vegetations  #1 Disseminated MRSA bacteremia:  --would ask IR to aspirate the right shoulder for :  Cell count and differential Crystals Gram stain and culture  --would get MRI of the right hand  --would make sure Ophthalmology still following closely --continue vancomycin --he will need protracted therapy and I would push for 6 weeks with Day #1 being  05/03/13 unless he is found to have septic joints that require further I and D and then would give him 6 weeks postop   Dr. Luciana Axe is back tomorrow.   LOS: 6 days   Acey Lav 05/07/2013, 4:02 PM

## 2013-05-07 NOTE — Progress Notes (Signed)
ANTIBIOTIC CONSULT NOTE - FOLLOW UP  Pharmacy Consult for Vancomycin Indication: staph aureus bacteremia  Allergies  Allergen Reactions  . Morphine And Related Anaphylaxis  . Penicillins Anaphylaxis  . Amitriptyline     unk  . Antara [Fenofibrate Micronized]     unk  . Bextra [Valdecoxib]     unk  . Cefdinir     unk  . Celebrex [Celecoxib]     unk  . Crestor [Rosuvastatin Calcium]     unk  . Escitalopram Oxalate     unk  . Fish Oil     unk  . Ibuprofen     unk  . Naproxen Sodium     unk  . Neurontin [Gabapentin]     unk  . Propoxyphene And Methadone     unk  . Skelaxin     unk  . Ultram [Tramadol]     unk  . Vioxx [Rofecoxib]     unk  . Zocor [Simvastatin]     unk    Patient Measurements: Height: 5\' 9"  (175.3 cm) Weight: 326 lb 1 oz (147.9 kg) IBW/kg (Calculated) : 70.7  Vital Signs: Temp: 99.6 F (37.6 C) (10/19 0003) Temp src: Oral (10/19 0003) BP: 188/82 mmHg (10/19 0003) Pulse Rate: 88 (10/19 0003) Intake/Output from previous day: 10/18 0701 - 10/19 0700 In: 240 [P.O.:240] Out: -  Intake/Output from this shift:    Labs:  Recent Labs  05/05/13 0545 05/06/13 0618  WBC 17.8* 16.9*  HGB 13.8 13.8  PLT 292 354  CREATININE 0.97 0.83   Estimated Creatinine Clearance: 130.9 ml/min (by C-G formula based on Cr of 0.83).  Recent Labs  05/06/13 2303  VANCOTROUGH 8.7*     Assessment: 62 yo male with staph aureus bacteremia started on Vanc. SCr is 0.83 and CrCl >100. ID is following and pt is Vanc D#5 for septic arthritis of multiple joints.  Pt is now afebrile, and WBC has dec to 16.9 from 20.1.  Vanc trough 8.7  10/14 doxy>> 10/15 10/14 vanc>>   10/13 synovial fluid- moderate MRSA (vanc MIC <0.5) 10/13 blood x2- MRSA (vanc MIC 1) 10/15 Blood - NGTD   Plan:  -Change vanc to 1250 mg IV Q8 hours -Will follow renal function, culture ID and clinical progress -Recheck VT when appropriate  Talbert Cage, PharmD Clinical  Pharmacist

## 2013-05-07 NOTE — Progress Notes (Signed)
Pt temp 101.0 at 2313. Pt recently received two percocet. Rechecked temp at 0003 and it was down to 99.6, but BP up to 188/82. PRN Hydralazine 5 mg given. Rechecked BP at 0050 185/95. Instructed pt to lay on left side. Rechecked BP at 0200 160/85. Routine vitals done at 0612 temp 101.2, BP 169/102. Instructed pt to lay on left side. Gave one Percocet and 650 mg Tylenol. Pt asymptomatic. Will continue to monitor.

## 2013-05-07 NOTE — Plan of Care (Signed)
Problem: Phase III Progression Outcomes Goal: Voiding independently Outcome: Completed/Met Date Met:  05/07/13 Voids in urinal

## 2013-05-07 NOTE — Progress Notes (Signed)
Subjective: 3 Days Post-Op Procedure(s) (LRB): TRANSESOPHAGEAL ECHOCARDIOGRAM (TEE) (N/A) Patient reports pain as 2 on 0-10 scale.    Objective: Vital signs in last 24 hours: Temp:  [98.4 F (36.9 C)-101.1 F (38.4 C)] 98.4 F (36.9 C) (10/19 0839) Pulse Rate:  [70-90] 70 (10/19 0839) Resp:  [18-29] 20 (10/19 0839) BP: (160-188)/(68-102) 177/82 mmHg (10/19 0839) SpO2:  [93 %-97 %] 95 % (10/19 0839)  Intake/Output from previous day: 10/18 0701 - 10/19 0700 In: 240 [P.O.:240] Out: 2125 [Urine:2125] Intake/Output this shift:     Recent Labs  05/05/13 0545 05/06/13 0618  HGB 13.8 13.8    Recent Labs  05/05/13 0545 05/06/13 0618  WBC 17.8* 16.9*  RBC 4.54 4.54  HCT 41.1 41.3  PLT 292 354    Recent Labs  05/06/13 0618 05/07/13 0514  NA 139 135  K 3.8 3.7  CL 97 94*  CO2 34* 31  BUN 15 12  CREATININE 0.83 0.81  GLUCOSE 121* 117*  CALCIUM 8.5 8.2*   No results found for this basename: LABPT, INR,  in the last 72 hours  ABD soft Neurovascular intact Sensation intact distally Intact pulses distally Dorsiflexion/Plantar flexion intact Incision: no drainage  Assessment/Plan: 3 Days Post-Op Procedure(s) (LRB): TRANSESOPHAGEAL ECHOCARDIOGRAM (TEE) (N/A) Principal Problem:   Septic arthritis of multiple joints Active Problems:   Morbid obesity   Staphylococcus aureus septicemia Diabetes Hypertension not well controlled Advance diet Up with therapy Patient spike a fever again last night   His knee looks good today,  New dressing applied. Disposition waiting on Infectious disease for antibiotic recommendations  Karoline Fleer J 05/07/2013, 11:33 AM

## 2013-05-07 NOTE — Consult Note (Signed)
CHART REVIEWED PT SEEN AND EXAMINED YESTERDAY AND TODAY FAMILY WAS CONCERNED ABOUT HIS RIGHT INDEX FINGER SWELLING AND PAIN PT'S FINGER LOOKS BETTER TODAY LESS SWELLING AND MINIMAL PAIN THE CONCERN ABOUT THE PATIENT IS THE SPIKING TEMPERATURES AND SOURCE OF INFECTION CLINICALLY AND FROM THE NOTES THE FINGER IS RESPONDING TO TREATMENT RIGHT NOW I DO NOT SEE THE INDICATION TO OPEN THE FINGER AND DRAIN A POTENTIAL SOURCE OF INFECTION I HAVE REVIEWED HIS PLAIN FILMS AND WOULD RECOMMEND CANCELING THE MRI OF HIS HAND. HE HAS A FOCAL PROCESS IN HIS RIGHT INDEX FINGER THAT IS GETTING BETTER AND WOULD FIRST SUGGEST TO REPEAT HIS PLAIN RADIOGRAPHS TO LOOK FOR A CHANGE IN THE BONE FROM HIS FILMS LAST WEEK. ALSO WOULD DO AN ULTRASOUND OF THE FINGER TO SEE IF THERE IS A FLUID COLLECTION OR IS IT GENERALIZED CELLULITIC PROCESS. I THINK ULTRASOUND WOULD BE AN EASIER TEST FOR THE PATIENT AND COULD GIVE THE SAME INFORMATION. I WILL CONTINUE TO FOLLOW HIS FINGER AND REVIEW STUDIES. CARE WAS COORDINATED WITH DR. Tarri Abernethy AND DISCUSSED OVER THE PHONE ABOUT THIS PLAN

## 2013-05-08 ENCOUNTER — Inpatient Hospital Stay (HOSPITAL_COMMUNITY): Payer: Medicare Other

## 2013-05-08 ENCOUNTER — Encounter (HOSPITAL_COMMUNITY): Payer: Self-pay | Admitting: Orthopedic Surgery

## 2013-05-08 DIAGNOSIS — R7881 Bacteremia: Secondary | ICD-10-CM

## 2013-05-08 DIAGNOSIS — H3090 Unspecified chorioretinal inflammation, unspecified eye: Secondary | ICD-10-CM

## 2013-05-08 DIAGNOSIS — A4902 Methicillin resistant Staphylococcus aureus infection, unspecified site: Secondary | ICD-10-CM

## 2013-05-08 DIAGNOSIS — M009 Pyogenic arthritis, unspecified: Secondary | ICD-10-CM

## 2013-05-08 LAB — BASIC METABOLIC PANEL
BUN: 11 mg/dL (ref 6–23)
CO2: 30 mEq/L (ref 19–32)
Calcium: 8.7 mg/dL (ref 8.4–10.5)
Creatinine, Ser: 0.83 mg/dL (ref 0.50–1.35)
GFR calc Af Amer: >90 mL/min (ref >90)
GFR calc non Af Amer: >90 mL/min (ref >90)
Glucose, Bld: 112 mg/dL — ABNORMAL HIGH (ref 70–99)
Potassium: 3.3 mEq/L — ABNORMAL LOW (ref 3.5–5.1)
Sodium: 136 mEq/L (ref 135–145)

## 2013-05-08 MED ORDER — RAMIPRIL 2.5 MG PO CAPS
2.5000 mg | ORAL_CAPSULE | Freq: Every day | ORAL | Status: DC
  Administered 2013-05-08 – 2013-05-10 (×3): 2.5 mg via ORAL
  Filled 2013-05-08 (×3): qty 1

## 2013-05-08 MED ORDER — POTASSIUM CHLORIDE CRYS ER 20 MEQ PO TBCR
40.0000 meq | EXTENDED_RELEASE_TABLET | Freq: Two times a day (BID) | ORAL | Status: AC
  Administered 2013-05-08 (×2): 40 meq via ORAL
  Filled 2013-05-08 (×2): qty 2

## 2013-05-08 MED ORDER — SODIUM CHLORIDE 0.9 % IJ SOLN: 10.0000 mL | INTRAMUSCULAR | Status: DC | PRN

## 2013-05-08 NOTE — Progress Notes (Signed)
Peripherally Inserted Central Catheter/Midline Placement  The IV Nurse has discussed with the patient and/or persons authorized to consent for the patient, the purpose of this procedure and the potential benefits and risks involved with this procedure.  The benefits include less needle sticks, lab draws from the catheter and patient may be discharged home with the catheter.  Risks include, but not limited to, infection, bleeding, blood clot (thrombus formation), and puncture of an artery; nerve damage and irregular heat beat.  Alternatives to this procedure were also discussed.  PICC/Midline Placement Documentation        Lisabeth Devoid 05/08/2013, 5:36 PM

## 2013-05-08 NOTE — Progress Notes (Signed)
Ultrasound report reviewed. There was noted fluid along tendon sheath. This goes along with objective findings but clinically given his improvement I would not recommend tendon sheath exploration and drainage.  If the finger worsens and becomes more red and swollen than I would suggest formal incision and drainage. Will continue to monitor clinically.

## 2013-05-08 NOTE — Plan of Care (Signed)
Problem: Discharge Progression Outcomes Goal: Pain controlled with appropriate interventions Outcome: Progressing Pt requesting pain medication less frequently

## 2013-05-08 NOTE — Progress Notes (Signed)
[facility]  Date of Admission:  05/01/2013  Antibiotics: Antibiotics Given (last 72 hours)   Date/Time Action Medication Dose Rate   05/05/13 1338 Given  [awaiting vanc level]   vancomycin (VANCOCIN) 1,500 mg in sodium chloride 0.9 % 500 mL IVPB 1,500 mg 250 mL/hr   05/05/13 2210 Given   vancomycin (VANCOCIN) 1,500 mg in sodium chloride 0.9 % 500 mL IVPB 1,500 mg 250 mL/hr   05/06/13 1047 Given   vancomycin (VANCOCIN) 1,500 mg in sodium chloride 0.9 % 500 mL IVPB 1,500 mg 250 mL/hr   05/06/13 2308 Given   vancomycin (VANCOCIN) 1,500 mg in sodium chloride 0.9 % 500 mL IVPB 1,500 mg 250 mL/hr   05/07/13 1610 Given   vancomycin (VANCOCIN) 1,250 mg in sodium chloride 0.9 % 250 mL IVPB 1,250 mg 166.7 mL/hr   05/07/13 1711 Given   vancomycin (VANCOCIN) 1,250 mg in sodium chloride 0.9 % 250 mL IVPB 1,250 mg 166.7 mL/hr   05/07/13 2240 Given   vancomycin (VANCOCIN) 1,250 mg in sodium chloride 0.9 % 250 mL IVPB 1,250 mg 166.7 mL/hr   05/08/13 0602 Given   vancomycin (VANCOCIN) 1,250 mg in sodium chloride 0.9 % 250 mL IVPB 1,250 mg 166.7 mL/hr      Subjective: No complaints, less pain, shoulder feels more like a muscle pull  Objective: Temp:  [98.2 F (36.8 C)-100.8 F (38.2 C)] 98.2 F (36.8 C) (10/20 0511) Pulse Rate:  [76-93] 76 (10/20 0921) Resp:  [18-20] 18 (10/20 0511) BP: (149-168)/(74-91) 168/74 mmHg (10/20 0921) SpO2:  [94 %-97 %] 95 % (10/20 0511)  General: Awake, alert Skin: right hand some swelling, redness, some tenderness Lungs: CTA B Cor: RRR  Abdomen: soft, nt, nd, obese Ext: no edema  Lab Results Lab Results  Component Value Date   WBC 16.9* 05/06/2013   HGB 13.8 05/06/2013   HCT 41.3 05/06/2013   MCV 91.0 05/06/2013   PLT 354 05/06/2013    Lab Results  Component Value Date   CREATININE 0.83 05/08/2013   BUN 11 05/08/2013   NA 136 05/08/2013   K 3.3* 05/08/2013   CL 96 05/08/2013   CO2 30 05/08/2013    Lab Results   Component Value Date   ALT 27 04/30/2013   AST 40* 04/30/2013   ALKPHOS 84 04/30/2013   BILITOT 1.1 04/30/2013      Microbiology: Recent Results (from the past 240 hour(s))  CULTURE, BLOOD (ROUTINE X 2)     Status: None   Collection Time    05/01/13 10:15 PM      Result Value Range Status   Specimen Description BLOOD FOREARM LEFT IV START   Final   Special Requests BOTTLES DRAWN AEROBIC AND ANAEROBIC 10CC EA   Final   Culture  Setup Time     Final   Value: 05/02/2013 07:40     Performed at [facility]   Culture     Final   Value: METHICILLIN RESISTANT STAPHYLOCOCCUS AUREUS     Note: RIFAMPIN AND GENTAMICIN SHOULD NOT BE USED AS SINGLE DRUGS FOR TREATMENT OF STAPH INFECTIONS. CRITICAL RESULT CALLED TO, READ BACK BY AND VERIFIED WITH: COURTNEY D @1421  05/04/13 BY KRAWS     Note: Gram Stain Report Called to,Read Back By and Verified With: PEACE DORMON ON 05/02/2013 AT 10:06P BY Serafina Mitchell     Performed at [facility]   Report Status 05/04/2013 FINAL   Final   Organism ID, Bacteria METHICILLIN RESISTANT  STAPHYLOCOCCUS AUREUS   Final  GRAM STAIN     Status: None   Collection Time    05/01/13 10:28 PM      Result Value Range Status   Specimen Description SYNOVIAL FLUID KNEE LEFT   Final   Special Requests Normal   Final   Gram Stain     Final   Value: ABUNDANT WBC PRESENT,BOTH PMN AND MONONUCLEAR     NO ORGANISMS SEEN   Report Status 05/01/2013 FINAL   Final  BODY FLUID CULTURE     Status: None   Collection Time    05/01/13 10:28 PM      Result Value Range Status   Specimen Description SYNOVIAL FLUID KNEE LEFT   Final   Special Requests Normal   Final   Gram Stain     Final   Value: ABUNDANT WBC PRESENT,BOTH PMN AND MONONUCLEAR     NO ORGANISMS SEEN     Performed at [facility]     Performed at [facility]   Culture     Final   Value: MODERATE METHICILLIN RESISTANT STAPHYLOCOCCUS AUREUS     Note: CRITICAL RESULT CALLED TO, READ BACK BY AND  VERIFIED WITH: COURTNEY D @1421  05/04/13 BY KRAWS     Performed at [facility]   Report Status 05/04/2013 FINAL   Final   Organism ID, Bacteria METHICILLIN RESISTANT STAPHYLOCOCCUS AUREUS   Final  CULTURE, BLOOD (ROUTINE X 2)     Status: None   Collection Time    05/01/13 11:48 PM      Result Value Range Status   Specimen Description BLOOD LEFT HAND   Final   Special Requests BOTTLES DRAWN AEROBIC ONLY 8CC   Final   Culture  Setup Time     Final   Value: 05/02/2013 07:39     Performed at [facility]   Culture     Final   Value: STAPHYLOCOCCUS AUREUS     Note: SUSCEPTIBILITIES PERFORMED ON PREVIOUS CULTURE WITHIN THE LAST 5 DAYS.     Note: Gram Stain Report Called to,Read Back By and Verified With: PEACE DORMON ON 05/02/2013 AT 10:06P BY WILEJ     Performed at [facility]   Report Status 05/04/2013 FINAL   Final  CULTURE, BLOOD (ROUTINE X 2)     Status: None   Collection Time    05/03/13 12:33 PM      Result Value Range Status   Specimen Description BLOOD RIGHT ARM   Final   Special Requests BOTTLES DRAWN AEROBIC AND ANAEROBIC 5CC   Final   Culture  Setup Time     Final   Value: 05/03/2013 17:05     Performed at [facility]   Culture     Final   Value:        BLOOD CULTURE RECEIVED NO GROWTH TO DATE CULTURE WILL BE HELD FOR 5 DAYS BEFORE ISSUING A FINAL NEGATIVE REPORT     Performed at [facility]   Report Status PENDING   Incomplete  CULTURE, BLOOD (ROUTINE X 2)     Status: None   Collection Time    05/03/13 12:40 PM      Result Value Range Status   Specimen Description BLOOD LEFT HAND   Final   Special Requests BOTTLES DRAWN AEROBIC AND ANAEROBIC 5CC   Final   Culture  Setup Time     Final   Value: 05/03/2013 17:05  Performed at [facility]     Final   Value:        BLOOD CULTURE RECEIVED NO GROWTH TO DATE CULTURE WILL BE HELD FOR 5 DAYS BEFORE ISSUING A FINAL NEGATIVE REPORT     Performed at [facility]   Report Status PENDING   Incomplete  SURGICAL PCR SCREEN     Status: Abnormal   Collection Time    05/03/13  5:13 PM      Result Value Range Status   MRSA, PCR POSITIVE (*) NEGATIVE Final   Staphylococcus aureus POSITIVE (*) NEGATIVE Final   Comment:            The Xpert SA Assay (FDA     approved for NASAL specimens     in patients over 29 years of age),     is one component of     a comprehensive surveillance     program.  Test performance has     been validated by [employer] for patients greater     than or equal to 68 year old.     It is not intended     to diagnose infection nor to     guide or monitor treatment.    Studies/Results: Dg Chest 2 View  05/07/2013   CLINICAL DATA:  Fever with shortness of breath.  EXAM: CHEST  2 VIEW  COMPARISON:  08/24/2012.  FINDINGS: There are lower lung volumes with new linear right upper lobe atelectasis, fissural thickening and central airway thickening. There is no overt pulmonary edema, confluent airspace opacity or pleural effusion. The pulmonary vascularity appears normal. The heart size and mediastinal contours are stable.  IMPRESSION: Increased central airway thickening and interstitial prominence suspicious for bronchitis. No evidence of pneumonia.   Electronically Signed   By: Roxy Horseman M.D.   On: 05/07/2013 16:29    Assessment/Plan: 1) MRSA bacteremia - clearing infection, no other sites infected.  To get xray and ultrasound of finger to assure no abscess.  No MRI indicated. -I discussed the shoulder exam and findings with the patient and we will cancel the aspiration now since it seems to be improving, we know the organism, and if worsens over the next several weeks, this can be readdressed.   -I will put in for picc line -vancomycin through 11/11 -weekly cbc, cmp to [facility]  -we will arrange follow up in [facility] in about 2 weeks  2) fever - Tmax 101, I suspect this is from the underlying disseminated  infection. -continue with vancomycin, 4-6 weeks through at least Nov 11th -I will add on ultrasound of hand now to facilitate    Staci Righter, MD [email or web address] [phone] pager   [phone] cell 05/08/2013, 11:17 AM

## 2013-05-08 NOTE — Progress Notes (Signed)
    Subjective:  Patient reports pain as mild in the Left knee, R shoulder and R hand  Objective:   VITALS:   Filed Vitals:   05/08/13 0511 05/08/13 0921 05/08/13 1354 05/08/13 2100  BP: 162/80 168/74 166/89 146/85  Pulse: 78 76 85 85  Temp: 98.2 F (36.8 C)  98.6 F (37 C) 98.1 F (36.7 C)  TempSrc: Oral  Oral Oral  Resp: 18  20 17   Height:      Weight:      SpO2: 95%  90% 90%    Physical Exam LLE: painless ROM L knee, portals benign, Distally NVI RUE: decreased swelling at IF and painless ROM at IP and MP joints. R shoulder with significantly improved ROM and no surrounding erythema. Distally NVI  Dressing: C/D/I  Compartments soft   LABS  Results for orders placed during the hospital encounter of 05/01/13 (from the past 24 hour(s))  BASIC METABOLIC PANEL     Status: Abnormal   Collection Time    05/08/13  4:00 AM      Result Value Range   Sodium 136  135 - 145 mEq/L   Potassium 3.3 (*) 3.5 - 5.1 mEq/L   Chloride 96  96 - 112 mEq/L   CO2 30  19 - 32 mEq/L   Glucose, Bld 112 (*) 70 - 99 mg/dL   BUN 11  6 - 23 mg/dL   Creatinine, Ser 4.09  0.50 - 1.35 mg/dL   Calcium 8.7  8.4 - 81.1 mg/dL   GFR calc non Af Amer >90  >90 mL/min   GFR calc Af Amer >90  >90 mL/min     Assessment/Plan: 4 Days Post-Op   Principal Problem:   Septic arthritis of multiple joints Active Problems:   Morbid obesity   Staphylococcus aureus septicemia   PLAN: Weight Bearing: WBAT all extremities Dressings: dry dressing to L knee VTE prophylaxis: SCD's and chemical per the primary team IV abx per ID  Given improvement of all joint pain, I recommend continued treatment with IV abx and Picc line for 6-8wks of treatment I agree with medicine team's decision to cancel the Right shoulder tap given his clinical response to abx.  Dispo: per primary, will likely need Picc and IV abx.    Margarita Rana, D 05/08/2013, 10:06 PM   Margarita Rana, MD Cell [phone]

## 2013-05-08 NOTE — Progress Notes (Signed)
PATIENT DETAILS Name: Seth Martinez Age: 62 y.o. Sex: male Date of Birth: 24-Aug-1950 Admit Date: 05/01/2013 Admitting Physician Hillary Bow, DO RUE:AVWUJ, Dorinda Hill, MD  Subjective: Admitted with fever, cough cold-for 1.5 weeks, then started having left knee pain swelling, right shoulder pain, right hip pain and right index finger pain/swelling.   Assessment/Plan: Principal Problem: Polyarticular Arthritis/ septic arthritis:  -following URI symptoms-did have one day of diarrhea. - Multiple joint pains associated with redness and swelling. He underwent arthrocentesis. The analysis showed Synovial fluid WBC 42070, gram stain of synovial fluid neg, synovial cx  Negative so far. . - Blood cultures show MRSA.  -will continue with IV Vanco for now, will need prolonged course.  -monitor and follow clinical course closely. - cardiology called for TEE  to evaluate for endocarditis, TEE done yesterday does not reveal any vegetations.  - CT shoulder and lumbar spine does not reveal any infection.  -  PICC in am,  as his recent blood cultures are negative  - d/c telemetry.  - he reported  Right shoulder and right hand pain over the last 48 hours, which seems to have improved today, he is able to elevate the arm above his shoulder. Initially CT guided aspiration was ordered, but cancelled in view of the improvement. Pt agrees to it.  - fever overnight,  CXR and urine analysis did not reveal pneumonia or any infection.  - we will go ahead with the PICC LINE today.  - for his right hand pain, we will repeat an X RAY of the hand and follow it with US of the joint to see if there is fluid accumulation .     left eye vision loss:  IMPROVING. Patient complained of sudden vision in the left eye . Ophthalmology consulted, followed by retina Specialist-  show vitritis possibly from the disseminated staph infection.  CT head did nto show any acute stroke.   Hypertension: - started him on  amlodipine. Pt wanted to be started on altace.  Prn hydralazine.   DVT prophylaxis   Chronic Back pain -c/w supportive care  Disposition: Remain inpatient, possible d/c in am.   DVT Prophylaxis: Prophylactic Heparin  Code Status: Full code   Family Communication Son in law at bedside  Procedures:  Arthrocentesis 10/13  Arthroscopy   TEE  CONSULTS: ID OPHTHALMOLOGY Orthopedics cardiology   MEDICATIONS: Scheduled Meds: . amLODipine  10 mg Oral Daily  . bisacodyl  10 mg Rectal Once  . Chlorhexidine Gluconate Cloth  6 each Topical Q0600  . Chlorhexidine Gluconate Cloth  6 each Topical Once  . heparin  5,000 Units Subcutaneous Q8H  . mupirocin ointment  1 application Nasal BID  . polyethylene glycol  17 g Oral BID  . potassium chloride  40 mEq Oral BID  . senna  2 tablet Oral BID  . vancomycin  1,250 mg Intravenous Q8H   Continuous Infusions:   PRN Meds:.acetaminophen, cyclobenzaprine, hydrALAZINE, oxyCODONE-acetaminophen  Antibiotics: Anti-infectives   Start     Dose/Rate Route Frequency Ordered Stop   05/07/13 0700  vancomycin (VANCOCIN) 1,250 mg in sodium chloride 0.9 % 250 mL IVPB     1,250 mg 166.7 mL/hr over 90 Minutes Intravenous Every 8 hours 05/07/13 0024     05/04/13 1100  vancomycin (VANCOCIN) 1,500 mg in sodium chloride 0.9 % 500 mL IVPB  Status:  Discontinued     1,500 mg 250 mL/hr over 120 Minutes Intravenous Every 12 hours 05/04/13 1048 05/07/13 0024   05/04/13 0750  gentamicin (GARAMYCIN) injection  Status:  Discontinued       As needed 05/04/13 0750 05/04/13 0831   05/04/13 0600  clindamycin (CLEOCIN) IVPB 900 mg     900 mg 100 mL/hr over 30 Minutes Intravenous On call to O.R. 05/03/13 1958 05/04/13 0728   05/03/13 1630  gentamicin (GARAMYCIN) injection 40 mg  Status:  Discontinued     40 mg Intramuscular  Once 05/03/13 1608 05/05/13 1310   05/02/13 1000  vancomycin (VANCOCIN) 1,500 mg in sodium chloride 0.9 % 500 mL IVPB  Status:   Discontinued     1,500 mg 250 mL/hr over 120 Minutes Intravenous Every 12 hours 05/02/13 0233 05/04/13 1048   05/02/13 0200  doxycycline (VIBRAMYCIN) 100 mg in dextrose 5 % 250 mL IVPB  Status:  Discontinued     100 mg 125 mL/hr over 120 Minutes Intravenous Every 12 hours 05/02/13 0148 05/03/13 1111   05/02/13 0000  vancomycin (VANCOCIN) 2,500 mg in sodium chloride 0.9 % 500 mL IVPB     2,500 mg 250 mL/hr over 120 Minutes Intravenous  Once 05/01/13 2345 05/02/13 0212       PHYSICAL EXAM: Vital signs in last 24 hours: Filed Vitals:   05/07/13 2328 05/08/13 0208 05/08/13 0511 05/08/13 0921  BP:   162/80 168/74  Pulse:   78 76  Temp: 100.8 F (38.2 C) 98.7 F (37.1 C) 98.2 F (36.8 C)   TempSrc: Oral Oral Oral   Resp:   18   Height:      Weight:      SpO2:   95%     Weight change:  Filed Weights   05/01/13 2116 05/02/13 0102  Weight: 145.605 kg (321 lb) 147.9 kg (326 lb 1 oz)   Body mass index is 48.13 kg/(m^2).   Gen Exam: sleeping comfortably Chest: B/L Clear.  CVS: S1 S2 Regular, no murmurs.  Abdomen: soft, BS +, non tender, non distended.  Extremities: no edema, lower extremities warm to touch. Left Knee swollen/erythematous/tender, right groin tender, right shoulder pain, right index finger-proximal part swollen and tender/red(?sausafe digit)left ankle swollen.  Neurologic: Non Focal.   Skin: No Rash.    Intake/Output from previous day:  Intake/Output Summary (Last 24 hours) at 05/08/13 1142 Last data filed at 05/08/13 0815  Gross per 24 hour  Intake    680 ml  Output   1525 ml  Net   -845 ml     LAB RESULTS: CBC  Recent Labs Lab 05/01/13 2215 05/02/13 0345 05/03/13 0605 05/05/13 0545 05/06/13 0618  WBC 16.6* 14.2* 20.1* 17.8* 16.9*  HGB 13.4 13.2 14.1 13.8 13.8  HCT 38.7* 37.6* 42.6 41.1 41.3  PLT 199 176 241 292 354  MCV 86.0 86.6 88.8 90.5 91.0  MCH 29.8 30.4 29.4 30.4 30.4  MCHC 34.6 35.1 33.1 33.6 33.4  RDW 13.8 13.8 14.3 14.8 15.0   LYMPHSABS 0.9  --   --   --   --   MONOABS 2.0*  --   --   --   --   EOSABS 0.0  --   --   --   --   BASOSABS 0.0  --   --   --   --     Chemistries   Recent Labs Lab 05/03/13 0605 05/05/13 0545 05/06/13 0618 05/07/13 0514 05/08/13 0400  NA 138 139 139 135 136  K 3.6 3.8 3.8 3.7 3.3*  CL 97 99 97 94* 96  CO2 25 33* 34* 31  30  GLUCOSE 110* 135* 121* 117* 112*  BUN 16 17 15 12 11   CREATININE 1.08 0.97 0.83 0.81 0.83  CALCIUM 8.5 8.3* 8.5 8.2* 8.7    CBG:  Recent Labs Lab 05/04/13 0519 05/04/13 0841 05/05/13 0739 05/05/13 1136 05/05/13 1645  GLUCAP 152* 118* 130* 154* 87    GFR Estimated Creatinine Clearance: 130.9 ml/min (by C-G formula based on Cr of 0.83).  Coagulation profile No results found for this basename: INR, PROTIME,  in the last 168 hours  Cardiac Enzymes No results found for this basename: CK, CKMB, TROPONINI, MYOGLOBIN,  in the last 168 hours  No components found with this basename: POCBNP,  No results found for this basename: DDIMER,  in the last 72 hours No results found for this basename: HGBA1C,  in the last 72 hours No results found for this basename: CHOL, HDL, LDLCALC, TRIG, CHOLHDL, LDLDIRECT,  in the last 72 hours No results found for this basename: TSH, T4TOTAL, FREET3, T3FREE, THYROIDAB,  in the last 72 hours No results found for this basename: VITAMINB12, FOLATE, FERRITIN, TIBC, IRON, RETICCTPCT,  in the last 72 hours No results found for this basename: LIPASE, AMYLASE,  in the last 72 hours  Urine Studies No results found for this basename: UACOL, UAPR, USPG, UPH, UTP, UGL, UKET, UBIL, UHGB, UNIT, UROB, ULEU, UEPI, UWBC, URBC, UBAC, CAST, CRYS, UCOM, BILUA,  in the last 72 hours  MICROBIOLOGY: Recent Results (from the past 240 hour(s))  CULTURE, BLOOD (ROUTINE X 2)     Status: None   Collection Time    05/01/13 10:15 PM      Result Value Range Status   Specimen Description BLOOD FOREARM LEFT IV START   Final   Special Requests  BOTTLES DRAWN AEROBIC AND ANAEROBIC 10CC EA   Final   Culture  Setup Time     Final   Value: 05/02/2013 07:40     Performed at [facility]   Culture     Final   Value: METHICILLIN RESISTANT STAPHYLOCOCCUS AUREUS     Note: RIFAMPIN AND GENTAMICIN SHOULD NOT BE USED AS SINGLE DRUGS FOR TREATMENT OF STAPH INFECTIONS. CRITICAL RESULT CALLED TO, READ BACK BY AND VERIFIED WITH: COURTNEY D @1421  05/04/13 BY KRAWS     Note: Gram Stain Report Called to,Read Back By and Verified With: PEACE DORMON ON 05/02/2013 AT 10:06P BY WILEJ     Performed at [facility]   Report Status 05/04/2013 FINAL   Final   Organism ID, Bacteria METHICILLIN RESISTANT STAPHYLOCOCCUS AUREUS   Final  GRAM STAIN     Status: None   Collection Time    05/01/13 10:28 PM      Result Value Range Status   Specimen Description SYNOVIAL FLUID KNEE LEFT   Final   Special Requests Normal   Final   Gram Stain     Final   Value: ABUNDANT WBC PRESENT,BOTH PMN AND MONONUCLEAR     NO ORGANISMS SEEN   Report Status 05/01/2013 FINAL   Final  BODY FLUID CULTURE     Status: None   Collection Time    05/01/13 10:28 PM      Result Value Range Status   Specimen Description SYNOVIAL FLUID KNEE LEFT   Final   Special Requests Normal   Final   Gram Stain     Final   Value: ABUNDANT WBC PRESENT,BOTH PMN AND MONONUCLEAR     NO ORGANISMS SEEN     Performed at  [facility]     Performed at [facility]   Culture     Final   Value: MODERATE METHICILLIN RESISTANT STAPHYLOCOCCUS AUREUS     Note: CRITICAL RESULT CALLED TO, READ BACK BY AND VERIFIED WITH: COURTNEY D @1421  05/04/13 BY KRAWS     Performed at [facility]   Report Status 05/04/2013 FINAL   Final   Organism ID, Bacteria METHICILLIN RESISTANT STAPHYLOCOCCUS AUREUS   Final  CULTURE, BLOOD (ROUTINE X 2)     Status: None   Collection Time    05/01/13 11:48 PM      Result Value Range Status   Specimen Description BLOOD LEFT HAND   Final   Special  Requests BOTTLES DRAWN AEROBIC ONLY 8CC   Final   Culture  Setup Time     Final   Value: 05/02/2013 07:39     Performed at [facility]   Culture     Final   Value: STAPHYLOCOCCUS AUREUS     Note: SUSCEPTIBILITIES PERFORMED ON PREVIOUS CULTURE WITHIN THE LAST 5 DAYS.     Note: Gram Stain Report Called to,Read Back By and Verified With: PEACE DORMON ON 05/02/2013 AT 10:06P BY WILEJ     Performed at [facility]   Report Status 05/04/2013 FINAL   Final  CULTURE, BLOOD (ROUTINE X 2)     Status: None   Collection Time    05/03/13 12:33 PM      Result Value Range Status   Specimen Description BLOOD RIGHT ARM   Final   Special Requests BOTTLES DRAWN AEROBIC AND ANAEROBIC 5CC   Final   Culture  Setup Time     Final   Value: 05/03/2013 17:05     Performed at [facility]   Culture     Final   Value:        BLOOD CULTURE RECEIVED NO GROWTH TO DATE CULTURE WILL BE HELD FOR 5 DAYS BEFORE ISSUING A FINAL NEGATIVE REPORT     Performed at [facility]   Report Status PENDING   Incomplete  CULTURE, BLOOD (ROUTINE X 2)     Status: None   Collection Time    05/03/13 12:40 PM      Result Value Range Status   Specimen Description BLOOD LEFT HAND   Final   Special Requests BOTTLES DRAWN AEROBIC AND ANAEROBIC 5CC   Final   Culture  Setup Time     Final   Value: 05/03/2013 17:05     Performed at [facility]   Culture     Final   Value:        BLOOD CULTURE RECEIVED NO GROWTH TO DATE CULTURE WILL BE HELD FOR 5 DAYS BEFORE ISSUING A FINAL NEGATIVE REPORT     Performed at [facility]   Report Status PENDING   Incomplete  SURGICAL PCR SCREEN     Status: Abnormal   Collection Time    05/03/13  5:13 PM      Result Value Range Status   MRSA, PCR POSITIVE (*) NEGATIVE Final   Staphylococcus aureus POSITIVE (*) NEGATIVE Final   Comment:            The Xpert SA Assay (FDA     approved for NASAL specimens     in patients over 31 years of age),     is one  component of     a comprehensive surveillance     program.  Test performance has     been validated by [facility] for patients greater     than or equal to 40 year old.     It is not intended     to diagnose infection nor to     guide or monitor treatment.    RADIOLOGY STUDIES/RESULTS: Dg Chest 2 View  04/30/2013   CLINICAL DATA:  Back pain and cough.  EXAM: CHEST  2 VIEW  COMPARISON:  No priors.  FINDINGS: Lung volumes are normal. No consolidative airspace disease. No pleural effusions. No pneumothorax. No pulmonary nodule or mass noted. Pulmonary vasculature and the cardiomediastinal silhouette are within normal limits. Old healed fracture of the distal right clavicle incidentally noted.  IMPRESSION: 1.  No radiographic evidence of acute cardiopulmonary disease.   Electronically Signed   By: Trudie Reed M.D.   On: 04/30/2013 15:22   Dg Lumbar Spine 2-3 Views  04/30/2013   CLINICAL DATA:  Back pain.  EXAM: LUMBAR SPINE - 2-3 VIEW  COMPARISON:  None.  FINDINGS: There is marked convex right scoliosis. Multilevel degenerative change is present. The patient is status post L4-S1 fusion. Hardware appears intact. No fracture is identified.  IMPRESSION: No acute finding.  Multilevel degenerative change. Status post L4-S1 fusion.   Electronically Signed   By: Drusilla Kanner M.D.   On: 04/30/2013 16:56   Dg Hip Complete Right  04/30/2013   CLINICAL DATA:  Low back pain and right-sided hip pain.  EXAM: RIGHT HIP - COMPLETE 2+ VIEW  COMPARISON:  No priors.  FINDINGS: AP view of the pelvis and AP and lateral views of the right hip demonstrate no definite acute displaced fracture, subluxation, dislocation, joint or soft tissue abnormality. Orthopedic fixation hardware is noted at the lumbosacral junction, and the fixation screw in the low was position on the left appears fractured. No prior studies are available for comparison.  IMPRESSION: 1. No acute radiographic abnormality of the bony pelvis  or the left hip. 2. Fracture of the inferior fixation screw on the left side in this patient status post PLIF at L4-S1. Whether or not this is an acute finding or has been present on prior outside studies is uncertain. Correlation with prior outside examinations is suggested.   Electronically Signed   By: Trudie Reed M.D.   On: 04/30/2013 16:50   Dg Knee 2 Views Left  05/02/2013   CLINICAL DATA:  Joint pain.  EXAM: LEFT KNEE - 1-2 VIEW  COMPARISON:  None.  FINDINGS: There is subcutaneous emphysema in a line along the medial knee, presumably from reported arthrocentesis. Small to moderate knee joint effusion, suprapatellar. No osteolysis. Mild tricompartmental osteoarthritis, without focal or advanced joint narrowing. No fracture, malalignment, or other focal osseous abnormality.  IMPRESSION: 1. Mild to moderate volume knee joint effusion. 2. Mild tricompartmental osteoarthritis. 3. Subcutaneous gas, presumably from reported arthrocentesis.   Electronically Signed   By: Tiburcio Pea M.D.   On: 05/02/2013 00:08   Dg Hand 2 View Right  05/02/2013   CLINICAL DATA:  Pain, no injury  EXAM: RIGHT HAND - 2 VIEW  COMPARISON:  None.  FINDINGS: There is no evidence of fracture or dislocation. There is no evidence of arthropathy or other focal bone abnormality. Mild soft tissue swelling is present  IMPRESSION: No acute osseous abnormality.   Electronically Signed   By: Davonna Belling M.D.   On: 05/02/2013 00:09    Jacody Beneke, MD  [facility] Pager:[phone]  If 7PM-7AM, please contact night-coverage [email or web address] Password [email or web address] 05/08/2013, 11:42 AM   LOS: 7 days

## 2013-05-09 LAB — BASIC METABOLIC PANEL
BUN: 12 mg/dL (ref 6–23)
CO2: 28 mEq/L (ref 19–32)
Calcium: 8.4 mg/dL (ref 8.4–10.5)
Chloride: 97 mEq/L (ref 96–112)
Creatinine, Ser: 0.89 mg/dL (ref 0.50–1.35)
GFR calc Af Amer: >90 mL/min (ref >90)
GFR calc non Af Amer: 88 mL/min — ABNORMAL LOW (ref >90)
Glucose, Bld: 138 mg/dL — ABNORMAL HIGH (ref 70–99)
Potassium: 4 mEq/L (ref 3.5–5.1)

## 2013-05-09 LAB — VANCOMYCIN, TROUGH: Vancomycin Tr: 14.7 ug/mL (ref 10.0–20.0)

## 2013-05-09 LAB — CULTURE, BLOOD (ROUTINE X 2)
Culture: NO GROWTH
Culture: NO GROWTH

## 2013-05-09 MED ORDER — VANCOMYCIN HCL 10 G IV SOLR: 1250.0000 mg | Freq: Three times a day (TID) | INTRAVENOUS | Status: DC

## 2013-05-09 MED ORDER — SENNA 8.6 MG PO TABS
2.0000 | ORAL_TABLET | Freq: Every day | ORAL | Status: DC | PRN
  Filled 2013-05-09: qty 2

## 2013-05-09 NOTE — Progress Notes (Signed)
PATIENT DETAILS Name: Seth Martinez Age: 62 y.o. Sex: male Date of Birth: Apr 20, 1951 Admit Date: 05/01/2013 Admitting Physician Hillary Bow, DO ZOX:WRUEA, Dorinda Hill, MD  Subjective: Admitted with fever, cough cold-for 1.5 weeks, then started having left knee pain swelling, right shoulder pain, right hip pain and right index finger pain/swelling.   Assessment/Plan: Principal Problem: Polyarticular Arthritis/ septic arthritis:  -following URI symptoms-did have one day of diarrhea. - Multiple joint pains associated with redness and swelling. He underwent arthrocentesis. The analysis showed Synovial fluid WBC 42070, gram stain of synovial fluid neg, synovial cx  Negative so far. . - Blood cultures show MRSA.  -will continue with IV Vanco for now, will need prolonged course.  -monitor and follow clinical course closely. - cardiology called for TEE  to evaluate for endocarditis, TEE done yesterday does not reveal any vegetations.  - CT shoulder and lumbar spine does not reveal any infection.  -  PICC in am,  as his recent blood cultures are negative  - d/c telemetry.  - he reported  Right shoulder and right hand pain over the last 48 hours, which seems to have improved today, he is able to elevate the arm above his shoulder. Initially CT guided aspiration was ordered, but cancelled in view of the improvement. Pt agrees to it.  - fever overnight,  CXR and urine analysis did not reveal pneumonia or any infection.  - we will go ahead with the PICC LINE today.  - for his right hand pain,  repeated an X RAY of the hand , did not show any progressive changes, and followed with US of the joint  Showed Right index finger flexor tenosynovitis.  - awaiting PT EVAL and ortho recommendations.    left eye vision loss:  IMPROVING. Patient complained of sudden vision in the left eye . Ophthalmology consulted, followed by retina Specialist-  show vitritis possibly from the disseminated staph  infection.  CT head did nto show any acute stroke.   Hypertension: - started him on amlodipine. Pt wanted to be started on altace.  Prn hydralazine.   DVT prophylaxis   Chronic Back pain -c/w supportive care  Disposition: Remain inpatient, possible d/c in am.   DVT Prophylaxis: Prophylactic Heparin  Code Status: Full code   Family Communication Daughter  at bedside  Procedures:  Arthrocentesis 10/13  Arthroscopy   TEE  CONSULTS: ID OPHTHALMOLOGY Orthopedics cardiology   MEDICATIONS: Scheduled Meds: . amLODipine  10 mg Oral Daily  . bisacodyl  10 mg Rectal Once  . Chlorhexidine Gluconate Cloth  6 each Topical Once  . heparin  5,000 Units Subcutaneous Q8H  . ramipril  2.5 mg Oral Daily  . vancomycin  1,250 mg Intravenous Q8H   Continuous Infusions:   PRN Meds:.acetaminophen, cyclobenzaprine, hydrALAZINE, oxyCODONE-acetaminophen, senna, sodium chloride  Antibiotics: Anti-infectives   Start     Dose/Rate Route Frequency Ordered Stop   05/09/13 0000  sodium chloride 0.9 % SOLN 250 mL with vancomycin 10 G SOLR 1,250 mg    Comments:  Please do weekly BMP'S AND VANCOMYCIN trough.   1,250 mg 166.7 mL/hr over 90 Minutes Intravenous Every 8 hours 05/09/13 1100 05/30/13 2359   05/07/13 0700  vancomycin (VANCOCIN) 1,250 mg in sodium chloride 0.9 % 250 mL IVPB     1,250 mg 166.7 mL/hr over 90 Minutes Intravenous Every 8 hours 05/07/13 0024     05/04/13 1100  vancomycin (VANCOCIN) 1,500 mg in sodium chloride 0.9 % 500 mL IVPB  Status:  Discontinued     1,500 mg 250 mL/hr over 120 Minutes Intravenous Every 12 hours 05/04/13 1048 05/07/13 0024   05/04/13 0750  gentamicin (GARAMYCIN) injection  Status:  Discontinued       As needed 05/04/13 0750 05/04/13 0831   05/04/13 0600  clindamycin (CLEOCIN) IVPB 900 mg     900 mg 100 mL/hr over 30 Minutes Intravenous On call to O.R. 05/03/13 1958 05/04/13 0728   05/03/13 1630  gentamicin (GARAMYCIN) injection 40 mg  Status:   Discontinued     40 mg Intramuscular  Once 05/03/13 1608 05/05/13 1310   05/02/13 1000  vancomycin (VANCOCIN) 1,500 mg in sodium chloride 0.9 % 500 mL IVPB  Status:  Discontinued     1,500 mg 250 mL/hr over 120 Minutes Intravenous Every 12 hours 05/02/13 0233 05/04/13 1048   05/02/13 0200  doxycycline (VIBRAMYCIN) 100 mg in dextrose 5 % 250 mL IVPB  Status:  Discontinued     100 mg 125 mL/hr over 120 Minutes Intravenous Every 12 hours 05/02/13 0148 05/03/13 1111   05/02/13 0000  vancomycin (VANCOCIN) 2,500 mg in sodium chloride 0.9 % 500 mL IVPB     2,500 mg 250 mL/hr over 120 Minutes Intravenous  Once 05/01/13 2345 05/02/13 0212       PHYSICAL EXAM: Vital signs in last 24 hours: Filed Vitals:   05/09/13 1019 05/09/13 1047 05/09/13 1143 05/09/13 1359  BP: 146/75 146/75 129/75 141/84  Pulse: 80   87  Temp: 98.6 F (37 C)   98.7 F (37.1 C)  TempSrc: Oral   Oral  Resp: 23   20  Height:      Weight:      SpO2: 90%  95% 99%    Weight change:  Filed Weights   05/01/13 2116 05/02/13 0102  Weight: 145.605 kg (321 lb) 147.9 kg (326 lb 1 oz)   Body mass index is 48.13 kg/(m^2).   Gen Exam: sleeping comfortably Chest: B/L Clear.  CVS: S1 S2 Regular, no murmurs.  Abdomen: soft, BS +, non tender, non distended.  Extremities: no edema, lower extremities warm to touch. Left Knee swollen/erythematous/tender, right groin tender, right shoulder pain, right index finger-proximal part swollen and tender/red(?sausafe digit)left ankle swollen.  Neurologic: Non Focal.   Skin: No Rash.    Intake/Output from previous day:  Intake/Output Summary (Last 24 hours) at 05/09/13 1828 Last data filed at 05/09/13 1100  Gross per 24 hour  Intake    490 ml  Output   2152 ml  Net  -1662 ml     LAB RESULTS: CBC  Recent Labs Lab 05/03/13 0605 05/05/13 0545 05/06/13 0618  WBC 20.1* 17.8* 16.9*  HGB 14.1 13.8 13.8  HCT 42.6 41.1 41.3  PLT 241 292 354  MCV 88.8 90.5 91.0  MCH 29.4 30.4  30.4  MCHC 33.1 33.6 33.4  RDW 14.3 14.8 15.0    Chemistries   Recent Labs Lab 05/05/13 0545 05/06/13 0618 05/07/13 0514 05/08/13 0400 05/09/13 0505  NA 139 139 135 136 134*  K 3.8 3.8 3.7 3.3* 4.0  CL 99 97 94* 96 97  CO2 33* 34* 31 30 28   GLUCOSE 135* 121* 117* 112* 138*  BUN 17 15 12 11 12   CREATININE 0.97 0.83 0.81 0.83 0.89  CALCIUM 8.3* 8.5 8.2* 8.7 8.4    CBG:  Recent Labs Lab 05/04/13 0519 05/04/13 0841 05/05/13 0739 05/05/13 1136 05/05/13 1645  GLUCAP 152* 118* 130* 154* 87    GFR Estimated  Creatinine Clearance: 120.5 ml/min (by C-G formula based on Cr of 0.89).  Coagulation profile No results found for this basename: INR, PROTIME,  in the last 168 hours  Cardiac Enzymes No results found for this basename: CK, CKMB, TROPONINI, MYOGLOBIN,  in the last 168 hours  No components found with this basename: POCBNP,  No results found for this basename: DDIMER,  in the last 72 hours No results found for this basename: HGBA1C,  in the last 72 hours No results found for this basename: CHOL, HDL, LDLCALC, TRIG, CHOLHDL, LDLDIRECT,  in the last 72 hours No results found for this basename: TSH, T4TOTAL, FREET3, T3FREE, THYROIDAB,  in the last 72 hours No results found for this basename: VITAMINB12, FOLATE, FERRITIN, TIBC, IRON, RETICCTPCT,  in the last 72 hours No results found for this basename: LIPASE, AMYLASE,  in the last 72 hours  Urine Studies No results found for this basename: UACOL, UAPR, USPG, UPH, UTP, UGL, UKET, UBIL, UHGB, UNIT, UROB, ULEU, UEPI, UWBC, URBC, UBAC, CAST, CRYS, UCOM, BILUA,  in the last 72 hours  MICROBIOLOGY: Recent Results (from the past 240 hour(s))  CULTURE, BLOOD (ROUTINE X 2)     Status: None   Collection Time    05/01/13 10:15 PM      Result Value Range Status   Specimen Description BLOOD FOREARM LEFT IV START   Final   Special Requests BOTTLES DRAWN AEROBIC AND ANAEROBIC 10CC EA   Final   Culture  Setup Time     Final    Value: 05/02/2013 07:40     Performed at [facility]   Culture     Final   Value: METHICILLIN RESISTANT STAPHYLOCOCCUS AUREUS     Note: RIFAMPIN AND GENTAMICIN SHOULD NOT BE USED AS SINGLE DRUGS FOR TREATMENT OF STAPH INFECTIONS. CRITICAL RESULT CALLED TO, READ BACK BY AND VERIFIED WITH: COURTNEY D @1421  05/04/13 BY KRAWS     Note: Gram Stain Report Called to,Read Back By and Verified With: PEACE DORMON ON 05/02/2013 AT 10:06P BY WILEJ     Performed at [facility]   Report Status 05/04/2013 FINAL   Final   Organism ID, Bacteria METHICILLIN RESISTANT STAPHYLOCOCCUS AUREUS   Final  GRAM STAIN     Status: None   Collection Time    05/01/13 10:28 PM      Result Value Range Status   Specimen Description SYNOVIAL FLUID KNEE LEFT   Final   Special Requests Normal   Final   Gram Stain     Final   Value: ABUNDANT WBC PRESENT,BOTH PMN AND MONONUCLEAR     NO ORGANISMS SEEN   Report Status 05/01/2013 FINAL   Final  BODY FLUID CULTURE     Status: None   Collection Time    05/01/13 10:28 PM      Result Value Range Status   Specimen Description SYNOVIAL FLUID KNEE LEFT   Final   Special Requests Normal   Final   Gram Stain     Final   Value: ABUNDANT WBC PRESENT,BOTH PMN AND MONONUCLEAR     NO ORGANISMS SEEN     Performed at [facility]     Performed at [facility]   Culture     Final   Value: MODERATE METHICILLIN RESISTANT STAPHYLOCOCCUS AUREUS     Note: CRITICAL RESULT CALLED TO, READ BACK BY AND VERIFIED WITH: COURTNEY D @1421  05/04/13 BY KRAWS     Performed at [facility]  Report Status 05/04/2013 FINAL   Final   Organism ID, Bacteria METHICILLIN RESISTANT STAPHYLOCOCCUS AUREUS   Final  CULTURE, BLOOD (ROUTINE X 2)     Status: None   Collection Time    05/01/13 11:48 PM      Result Value Range Status   Specimen Description BLOOD LEFT HAND   Final   Special Requests BOTTLES DRAWN AEROBIC ONLY 8CC   Final   Culture  Setup Time     Final    Value: 05/02/2013 07:39     Performed at [facility]   Culture     Final   Value: STAPHYLOCOCCUS AUREUS     Note: SUSCEPTIBILITIES PERFORMED ON PREVIOUS CULTURE WITHIN THE LAST 5 DAYS.     Note: Gram Stain Report Called to,Read Back By and Verified With: PEACE DORMON ON 05/02/2013 AT 10:06P BY Serafina Mitchell     Performed at [facility]   Report Status 05/04/2013 FINAL   Final  CULTURE, BLOOD (ROUTINE X 2)     Status: None   Collection Time    05/03/13 12:33 PM      Result Value Range Status   Specimen Description BLOOD RIGHT ARM   Final   Special Requests BOTTLES DRAWN AEROBIC AND ANAEROBIC 5CC   Final   Culture  Setup Time     Final   Value: 05/03/2013 17:05     Performed at [facility]   Culture     Final   Value: NO GROWTH 5 DAYS     Performed at [facility]   Report Status 05/09/2013 FINAL   Final  CULTURE, BLOOD (ROUTINE X 2)     Status: None   Collection Time    05/03/13 12:40 PM      Result Value Range Status   Specimen Description BLOOD LEFT HAND   Final   Special Requests BOTTLES DRAWN AEROBIC AND ANAEROBIC 5CC   Final   Culture  Setup Time     Final   Value: 05/03/2013 17:05     Performed at [facility]   Culture     Final   Value: NO GROWTH 5 DAYS     Performed at [facility]   Report Status 05/09/2013 FINAL   Final  SURGICAL PCR SCREEN     Status: Abnormal   Collection Time    05/03/13  5:13 PM      Result Value Range Status   MRSA, PCR POSITIVE (*) NEGATIVE Final   Staphylococcus aureus POSITIVE (*) NEGATIVE Final   Comment:            The Xpert SA Assay (FDA     approved for NASAL specimens     in patients over 33 years of age),     is one component of     a comprehensive surveillance     program.  Test performance has     been validated by [employer] for patients greater     than or equal to 14 year old.     It is not intended     to diagnose infection nor to     guide or monitor treatment.   GC/CHLAMYDIA PROBE AMP     Status: None   Collection Time    05/07/13  1:05 AM      Result Value Range Status   CT Probe RNA NEGATIVE  NEGATIVE Final   GC Probe RNA NEGATIVE  NEGATIVE Final  Comment: (NOTE)                                                                                                 Assay performed using the Gen-Probe APTIMA COMBO2 (R) Assay.     Acceptable specimen types for this assay include APTIMA Swabs (Unisex,     endocervical, urethral, or vaginal), first void urine, and ThinPrep     liquid based cytology samples.     Performed at [facility]    RADIOLOGY STUDIES/RESULTS: Dg Chest 2 View  04/30/2013   CLINICAL DATA:  Back pain and cough.  EXAM: CHEST  2 VIEW  COMPARISON:  No priors.  FINDINGS: Lung volumes are normal. No consolidative airspace disease. No pleural effusions. No pneumothorax. No pulmonary nodule or mass noted. Pulmonary vasculature and the cardiomediastinal silhouette are within normal limits. Old healed fracture of the distal right clavicle incidentally noted.  IMPRESSION: 1.  No radiographic evidence of acute cardiopulmonary disease.   Electronically Signed   By: Trudie Reed M.D.   On: 04/30/2013 15:22   Dg Lumbar Spine 2-3 Views  04/30/2013   CLINICAL DATA:  Back pain.  EXAM: LUMBAR SPINE - 2-3 VIEW  COMPARISON:  None.  FINDINGS: There is marked convex right scoliosis. Multilevel degenerative change is present. The patient is status post L4-S1 fusion. Hardware appears intact. No fracture is identified.  IMPRESSION: No acute finding.  Multilevel degenerative change. Status post L4-S1 fusion.   Electronically Signed   By: Drusilla Kanner M.D.   On: 04/30/2013 16:56   Dg Hip Complete Right  04/30/2013   CLINICAL DATA:  Low back pain and right-sided hip pain.  EXAM: RIGHT HIP - COMPLETE 2+ VIEW  COMPARISON:  No priors.  FINDINGS: AP view of the pelvis and AP and lateral views of the right hip demonstrate no definite acute displaced  fracture, subluxation, dislocation, joint or soft tissue abnormality. Orthopedic fixation hardware is noted at the lumbosacral junction, and the fixation screw in the low was position on the left appears fractured. No prior studies are available for comparison.  IMPRESSION: 1. No acute radiographic abnormality of the bony pelvis or the left hip. 2. Fracture of the inferior fixation screw on the left side in this patient status post PLIF at L4-S1. Whether or not this is an acute finding or has been present on prior outside studies is uncertain. Correlation with prior outside examinations is suggested.   Electronically Signed   By: Trudie Reed M.D.   On: 04/30/2013 16:50   Dg Knee 2 Views Left  05/02/2013   CLINICAL DATA:  Joint pain.  EXAM: LEFT KNEE - 1-2 VIEW  COMPARISON:  None.  FINDINGS: There is subcutaneous emphysema in a line along the medial knee, presumably from reported arthrocentesis. Small to moderate knee joint effusion, suprapatellar. No osteolysis. Mild tricompartmental osteoarthritis, without focal or advanced joint narrowing. No fracture, malalignment, or other focal osseous abnormality.  IMPRESSION: 1. Mild to moderate volume knee joint effusion. 2. Mild tricompartmental osteoarthritis. 3. Subcutaneous gas, presumably from reported arthrocentesis.   Electronically Signed   By: Christiane Ha  Watts M.D.   On: 05/02/2013 00:08   Dg Hand 2 View Right  05/02/2013   CLINICAL DATA:  Pain, no injury  EXAM: RIGHT HAND - 2 VIEW  COMPARISON:  None.  FINDINGS: There is no evidence of fracture or dislocation. There is no evidence of arthropathy or other focal bone abnormality. Mild soft tissue swelling is present  IMPRESSION: No acute osseous abnormality.   Electronically Signed   By: Davonna Belling M.D.   On: 05/02/2013 00:09    Zadin Lange, MD  [facility] Pager:[phone]  If 7PM-7AM, please contact night-coverage [email or web address] Password [email or web address] 05/09/2013, 6:28 PM   LOS: 8  days

## 2013-05-09 NOTE — Evaluation (Signed)
Physical Therapy Evaluation Patient Details Name: Seth Martinez MRN: [id number] DOB: 1951-05-15 Today's Date: 05/09/2013 Time: 4098-1191 PT Time Calculation (min): 75 min  PT Assessment / Plan / Recommendation History of Present Illness  62 y.o. male admitted to [facility] on 05/01/13 with complaints of fever, as well as pain in R shoulder, R index finger MCP joint, and L knee.   Joints are red, hot, and swollen.  Fever in ED of 103.1.  Dx with septic arthritis s/p L knee scope on 05/04/13.  Pt is WBAT left knee.    Clinical Impression  Pt is POD #5 L knee scope and is having difficulty moving more so due to right shoulder and hip pain than post op left knee pain.  He would benefit from a hospital bed at home due to how much he struggled to get to EOB today with me with the bed flat and no railing.  I fear that if he continues to struggle this much to get out of the bed it will exacerbate his right shoulder and hip pain.  He needs to be able to use the lift component to raise the head of the bed up so that he doesn't have to pull and strain so hard to get his trunk to upright sitting.  Because of his h/o lumbar surgery he log rolls to get out of bed and without two good arms he is at risk to roll out of the regular bed that does not have rails and injure himself at home.  It is for these reasons that the pt would benefit from a hospital bed at discharge and HHPT f/u for home assessment and safety.  HEP for left knee provided in writing today to pt.  Daughter present for all education and mobility.   PT to follow acutely for deficits listed below.     PT Assessment  Patient needs continued PT services    Follow Up Recommendations  Home health PT;Supervision/Assistance - 24 hour    Does the patient have the potential to tolerate intense rehabilitation     NA  Barriers to Discharge   None      Equipment Recommendations  Hospital bed    Recommendations for Other Services   None  Frequency Min  5X/week    Precautions / Restrictions Precautions Precautions: Knee Precaution Booklet Issued: Yes (comment) Precaution Comments: knee exercise handout Restrictions Weight Bearing Restrictions: No LLE Weight Bearing: Weight bearing as tolerated Other Position/Activity Restrictions: no pillow under left knee   Pertinent Vitals/Pain See vitals flow sheet.      Mobility  Bed Mobility Bed Mobility: Supine to Sit;Sitting - Scoot to Edge of Bed;Sit to Sidelying Right;Scooting to HOB Supine to Sit: 4: Min assist;HOB flat;Other (comment) (no rails) Sitting - Scoot to Edge of Bed: 5: Supervision;Other (comment) (no rails) Sit to Sidelying Right: 4: Min assist;HOB flat (pt using armrest of chair next to bed for leverage ) Scooting to [facility]: 4: Min assist Details for Bed Mobility Assistance: pt is struggling to get out of the bed due to right shoulder pain and decreased strength.  Pt log rolls to get up and down out of bed due to his bad back.  He will likely continue to irritate his shoulder and back if he had to get up and down in a regular bed without the railings and ability to raise the head of the bed up to help get his trunk to upright sitting.   Transfers Transfers: Sit to Stand;Stand to  Sit Sit to Stand: 4: Min assist;From elevated surface;With upper extremity assist;With armrests;From bed;From chair/3-in-1;From toilet Stand to Sit: 4: Min assist;With upper extremity assist;With armrests;To bed;To chair/3-in-1;To toilet Details for Transfer Assistance: min assist needed to support trunk during transitions.  Max verbal cues for safe hand placement.  Ambulation/Gait Ambulation/Gait Assistance: 4: Min assist Ambulation Distance (Feet): 100 Feet Assistive device: Rolling walker Ambulation/Gait Assistance Details: cues for leg sequencing, upright posture and safe use of RW.   Gait Pattern: Step-to pattern;Antalgic;Trunk flexed Gait velocity: decreased General Gait Details: decreased  endurance, decreased tolerance of pushing RW with his right arm.   Stairs: Yes Stairs Assistance: 4: Min Editor, commissioning Details (indicate cue type and reason): min assist to support trunk over weak and painful left knee Stair Management Technique: One rail Right;Sideways Number of Stairs: 3    Exercises Total Joint Exercises Ankle Circles/Pumps: AROM;Both;20 reps;Supine Quad Sets: AROM;Left;10 reps;Supine Towel Squeeze: AROM;Both;10 reps;Supine Short Arc Quad: AROM;Left;10 reps;Supine Heel Slides: AROM;Left;10 reps;Supine Hip ABduction/ADduction: AROM;Left;10 reps;Supine Straight Leg Raises: AROM;Left;5 reps;Supine   PT Diagnosis: Difficulty walking;Abnormality of gait;Generalized weakness;Acute pain  PT Problem List: Decreased strength;Decreased range of motion;Decreased activity tolerance;Decreased balance;Decreased mobility;Decreased knowledge of use of DME;Decreased knowledge of precautions;Obesity;Pain PT Treatment Interventions: DME instruction;Gait training;Stair training;Functional mobility training;Therapeutic activities;Therapeutic exercise;Balance training;Patient/family education;Modalities     PT Goals(Current goals can be found in the care plan section) Acute Rehab PT Goals Patient Stated Goal: to go home safely PT Goal Formulation: With patient/family Time For Goal Achievement: 05/16/13 Potential to Achieve Goals: Good  Visit Information  Last PT Received On: 05/09/13 Assistance Needed: +1 History of Present Illness: 62 y.o. male admitted to [facility] on 05/01/13 with complaints of fever, as well as pain in R shoulder, R index finger MCP joint, and L knee.   Joints are red, hot, and swollen.  Fever in ED of 103.1.  Dx with septic arthritis s/p L knee scope on 05/04/13.  Pt is WBAT left knee.         Prior Functioning  Home Living Family/patient expects to be discharged to:: Private residence Living Arrangements: Spouse/significant other Available Help at  Discharge: Family;Available 24 hours/day Type of Home: House Home Access: Stairs to enter [employer] of Steps: 5 Entrance Stairs-Rails: Right Home Layout: One level Home Equipment: Walker - standard;Shower seat - built in Prior Function Level of Independence: Independent Communication Communication: No difficulties    Cognition  Cognition Arousal/Alertness: Awake/alert Behavior During Therapy: WFL for tasks assessed/performed Overall Cognitive Status: Within Functional Limits for tasks assessed    Extremity/Trunk Assessment Upper Extremity Assessment Upper Extremity Assessment: RUE deficits/detail RUE Deficits / Details: right arm is the most painful of his joints, even more painful than his surgical leg.  He is limited to 3-/5 strength in the shoulder with most limitations in ROM with internal rotation and flexion.   Lower Extremity Assessment Lower Extremity Assessment: RLE deficits/detail;LLE deficits/detail RLE Deficits / Details: right hip is painful with limited flexion.  Functionally he can sit EOB with hips at 90 degrees, but in supine he cannot position knee and hip in flexion.  Strength limited due to hip pain to 3+/5- 4/5 in the hip and right leg LLE Deficits / Details: lef knee is 3-/5 strength hip 3+/5 strength and ankle swollen due to post-op swelling to gravity.  Ankle strength is 4/5.   Cervical / Trunk Assessment Cervical / Trunk Assessment: Other exceptions Cervical / Trunk Exceptions: h/o low back surgery 21 years ago per pt  Balance Balance Balance Assessed: Yes Static Sitting Balance Static Sitting - Balance Support: Bilateral upper extremity supported;Feet supported Static Sitting - Level of Assistance: 6: Modified independent (Device/Increase time) Static Standing Balance Static Standing - Balance Support: Bilateral upper extremity supported Static Standing - Level of Assistance: 5: Stand by assistance Dynamic Standing Balance Dynamic Standing  - Balance Support: Bilateral upper extremity supported Dynamic Standing - Level of Assistance: 4: Min assist  End of Session PT - End of Session Activity Tolerance: Patient limited by fatigue Patient left: in bed;with call bell/phone within reach;with family/visitor present Nurse Communication: Mobility status    Lurena Joiner B. Inesha Sow, PT, DPT [phone]   05/09/2013, 6:13 PM

## 2013-05-09 NOTE — Progress Notes (Signed)
Nutrition Brief Note  Patient identified on the Malnutrition Screening Tool (MST) Report  Wt Readings from Last 15 Encounters:  05/02/13 326 lb 1 oz (147.9 kg)  05/02/13 326 lb 1 oz (147.9 kg)  05/02/13 326 lb 1 oz (147.9 kg)  12/28/12 321 lb 6.4 oz (145.786 kg)    Body mass index is 48.13 kg/(m^2). Patient meets criteria for morbid obesity based on current BMI.   Current diet order is Parke Simmers, patient is consuming approximately 100% of meals at this time. Labs and medications reviewed.   RD met with pt and family who report pt has been eating well without recent wt loss.  Pt states that he prefers home meals and regular diet.  He has mostly been eating fruit plate yogurt for protein due to food preferences.  No nutrition interventions warranted at this time, however recommend diet liberalizaton if appropriate per MD. If nutrition issues arise, please consult RD.   Loyce Dys, MS RD LDN Clinical Inpatient Dietitian Pager: [phone] Weekend/After hours pager: [phone]

## 2013-05-09 NOTE — Progress Notes (Signed)
ANTIBIOTIC CONSULT NOTE - FOLLOW UP  Pharmacy Consult for Vancomycin Indication: staph aureus bacteremia  Allergies  Allergen Reactions  . Morphine And Related Anaphylaxis  . Penicillins Anaphylaxis  . Amitriptyline     unk  . Antara [Fenofibrate Micronized]     unk  . Bextra [Valdecoxib]     unk  . Cefdinir     unk  . Celebrex [Celecoxib]     unk  . Crestor [Rosuvastatin Calcium]     unk  . Escitalopram Oxalate     unk  . Fish Oil     unk  . Ibuprofen     unk  . Naproxen Sodium     unk  . Neurontin [Gabapentin]     unk  . Propoxyphene And Methadone     unk  . Skelaxin     unk  . Ultram [Tramadol]     unk  . Vioxx [Rofecoxib]     unk  . Zocor [Simvastatin]     unk    Patient Measurements: Height: 5\' 9"  (175.3 cm) Weight: 326 lb 1 oz (147.9 kg) IBW/kg (Calculated) : 70.7  Vital Signs: Temp: 98.7 F (37.1 C) (10/21 1359) Temp src: Oral (10/21 1359) BP: 141/84 mmHg (10/21 1359) Pulse Rate: 87 (10/21 1359) Intake/Output from previous day: 10/20 0701 - 10/21 0700 In: 490 [I.V.:240; IV Piggyback:250] Out: 3101 [Urine:3100; Stool:1] Intake/Output from this shift: Total I/O In: -  Out: 952 [Urine:950; Stool:2]  Labs:  Recent Labs  05/07/13 0514 05/08/13 0400 05/09/13 0505  CREATININE 0.81 0.83 0.89   Estimated Creatinine Clearance: 120.5 ml/min (by C-G formula based on Cr of 0.89).  Recent Labs  05/06/13 2303 05/09/13 1510  VANCOTROUGH 8.7* 14.7     Assessment: 62 yo male with staph aureus bacteremia started on Vanc. SCr is 0.83 and CrCl >100. ID is following and pt is Vanc D#7 for septic arthritis of multiple joints.  Pt is now afebrile, and WBC has dec to 16.9 from 20.1.  Vanc trough 14.7 - but drawn a little late.  Suspect true trough in 15-20 range.  10/14 doxy>> 10/15 10/14 vanc>>   10/13 synovial fluid- moderate MRSA (vanc MIC <0.5) 10/13 blood x2- MRSA (vanc MIC 1) 10/15 Blood - NGTD  Goal of Therapy: Vancomycin trough  15-20  Plan:  1. Continue vancomycin 1250 mg IV q 8 hrs. 2. Will recheck trough in a few days if patient still in hospital. 3. Watch renal function.  Tad Moore, BCPS  Clinical Pharmacist Pager [phone]  05/09/2013 4:26 PM

## 2013-05-09 NOTE — Care Management Note (Addendum)
    Page 1 of 2   05/10/2013     11:20:39 AM   CARE MANAGEMENT NOTE 05/10/2013  Patient:  Seth Martinez, Seth Martinez   Account Number:  [id number]  Date Initiated:  05/08/2013  Documentation initiated by:  Letha Cape  Subjective/Objective Assessment:   dx septi arthritis of multiple joints  admit- lives with spouse.     Action/Plan:   10/20 - picc line  10/21- pt eval- rec hhpt,ot .   Anticipated DC Date:  05/10/2013   Anticipated DC Plan:  HOME W HOME HEALTH SERVICES      DC Planning Services  CM consult      [facility] Choice  HOME HEALTH   Choice offered to / List presented to:  C-3 Spouse        HH arranged  HH-1 RN      [facility] agency  [facility]   Status of service:  Completed, signed off Medicare Important Message given?   (If response is "NO", the following Medicare IM given date fields will be blank) Date Medicare IM given:   Date Additional Medicare IM given:    Discharge Disposition:  HOME W HOME HEALTH SERVICES  Per UR Regulation:  Reviewed for med. necessity/level of care/duration of stay  If discussed at Long Length of Stay Meetings, dates discussed:    Comments:  05/10/13 11:17 Letha Cape RN, BSN [phone] per physical therapy recs hhpt/ot and hospital bed. Patient states he does not want hhpt because he was so sore yesterday when he finished.  Called Justin with [facility] to give him phone numbers for coordination for the delivery of the hospital bed.   Annice Pih - wife- cell [phone] and home phone [phone].  RN asked wife if they plan to transport patient by car or by ambulance, wife stated by car.  05/09/13 16:58 Letha Cape RN, BSN [phone] patient for dc today, per MD, patient was asking about hip pain, hand pain, and ortho was suppose to come to see patient today and they have not seen him yet, MD stated she will call ortho to see why they have not seen patient, also MD ordered pt eval to see patient today, informed MD pt eval could be done at  home.   MD states patient will probably go home in am.   05/08/13 16:55 Letha Cape RN, BSN [phone] patient wife chose [facility] from agency list for [facility] for IV abx, referral made to [facility] .  Soc will begin 24-48 hrs post dc.

## 2013-05-10 DIAGNOSIS — M009 Pyogenic arthritis, unspecified: Secondary | ICD-10-CM

## 2013-05-10 DIAGNOSIS — H4389 Other disorders of vitreous body: Secondary | ICD-10-CM

## 2013-05-10 LAB — BASIC METABOLIC PANEL
BUN: 12 mg/dL (ref 6–23)
CO2: 29 mEq/L (ref 19–32)
Calcium: 8.5 mg/dL (ref 8.4–10.5)
Chloride: 98 mEq/L (ref 96–112)
Creatinine, Ser: 0.92 mg/dL (ref 0.50–1.35)
GFR calc Af Amer: >90 mL/min (ref >90)
GFR calc non Af Amer: 87 mL/min — ABNORMAL LOW (ref >90)
Sodium: 136 mEq/L (ref 135–145)

## 2013-05-10 MED ORDER — OXYCODONE-ACETAMINOPHEN 5-325 MG PO TABS: 1.0000 | ORAL_TABLET | Freq: Three times a day (TID) | ORAL | Status: DC | PRN

## 2013-05-10 MED ORDER — VANCOMYCIN HCL 10 G IV SOLR: 1250.0000 mg | Freq: Three times a day (TID) | INTRAVENOUS | Status: DC

## 2013-05-10 MED ORDER — RAMIPRIL 2.5 MG PO CAPS: 2.5000 mg | ORAL_CAPSULE | Freq: Every day | ORAL | Status: DC

## 2013-05-10 MED ORDER — AMLODIPINE BESYLATE 10 MG PO TABS: 10.0000 mg | ORAL_TABLET | Freq: Every day | ORAL | Status: DC

## 2013-05-10 NOTE — Progress Notes (Signed)
Seth Martinez to be D/C'd Home per MD order with home health and picc line. Picc line saline locked off by IV RN.  Discussed with the patient and all questions fully answered.    Medication List         acetaminophen 500 MG tablet  Commonly known as:  TYLENOL  Take 500 mg by mouth every 6 (six) hours as needed for pain.     amLODipine 10 MG tablet  Commonly known as:  NORVASC  Take 1 tablet (10 mg total) by mouth daily.     cyclobenzaprine 10 MG tablet  Commonly known as:  FLEXERIL  Take 10 mg by mouth daily as needed for muscle spasms.     DAYQUIL PO  Take 30 mLs by mouth every 6 (six) hours as needed (cold symptoms).     dextromethorphan 30 MG/5ML liquid  Commonly known as:  DELSYM  Take 60 mg by mouth every 12 (twelve) hours as needed for cough.     ibuprofen 200 MG tablet  Commonly known as:  ADVIL,MOTRIN  Take 200 mg by mouth every 6 (six) hours as needed for pain.     NYQUIL PO  Take 30 mLs by mouth at bedtime as needed (cold symptoms).     oxyCODONE-acetaminophen 5-325 MG per tablet  Commonly known as:  PERCOCET/ROXICET  Take 1 tablet by mouth every 8 (eight) hours as needed for pain.     ramipril 2.5 MG capsule  Commonly known as:  ALTACE  Take 1 capsule (2.5 mg total) by mouth daily.     sodium chloride 0.9 % SOLN 250 mL with vancomycin 10 G SOLR 1,250 mg  Inject 1,250 mg into the vein every 8 (eight) hours.        VVS, Scattered bruising, incisions with clean and dry dressings to left knee, free of any old drainage or complications.    An After Visit Summary was printed and given to the patient. Patient escorted via WC, and D/C home via private auto.  Driggers, Rae Roam 05/10/2013 12:24 PM

## 2013-05-10 NOTE — Progress Notes (Signed)
[facility]  Date of Admission:  05/01/2013  Antibiotics: Antibiotics Given (last 72 hours)   Date/Time Action Medication Dose Rate   05/07/13 1711 Given   vancomycin (VANCOCIN) 1,250 mg in sodium chloride 0.9 % 250 mL IVPB 1,250 mg 166.7 mL/hr   05/07/13 2240 Given   vancomycin (VANCOCIN) 1,250 mg in sodium chloride 0.9 % 250 mL IVPB 1,250 mg 166.7 mL/hr   05/08/13 0602 Given   vancomycin (VANCOCIN) 1,250 mg in sodium chloride 0.9 % 250 mL IVPB 1,250 mg 166.7 mL/hr   05/08/13 1733 Given   vancomycin (VANCOCIN) 1,250 mg in sodium chloride 0.9 % 250 mL IVPB 1,250 mg 166.7 mL/hr   05/08/13 2222 Given   vancomycin (VANCOCIN) 1,250 mg in sodium chloride 0.9 % 250 mL IVPB 1,250 mg 166.7 mL/hr   05/09/13 0604 Given   vancomycin (VANCOCIN) 1,250 mg in sodium chloride 0.9 % 250 mL IVPB 1,250 mg 166.7 mL/hr   05/09/13 1557 Given   vancomycin (VANCOCIN) 1,250 mg in sodium chloride 0.9 % 250 mL IVPB 1,250 mg 166.7 mL/hr   05/09/13 2241 Given   vancomycin (VANCOCIN) 1,250 mg in sodium chloride 0.9 % 250 mL IVPB 1,250 mg 166.7 mL/hr   05/10/13 0705 Given   vancomycin (VANCOCIN) 1,250 mg in sodium chloride 0.9 % 250 mL IVPB 1,250 mg 166.7 mL/hr      Subjective: Still with shoulder pain  Objective: Temp:  [98.3 F (36.8 C)-98.7 F (37.1 C)] 98.6 F (37 C) (10/22 0607) Pulse Rate:  [78-89] 89 (10/22 0607) Resp:  [20] 20 (10/22 0607) BP: (129-158)/(75-89) 157/89 mmHg (10/22 0607) SpO2:  [90 %-99 %] 93 % (10/22 0607)  General: Awake, alert Skin: right hand some swelling, redness, some tenderness Lungs: CTA B Cor: RRR  Abdomen: soft, nt, nd, obese Ext: no edema  Lab Results Lab Results  Component Value Date   WBC 16.9* 05/06/2013   HGB 13.8 05/06/2013   HCT 41.3 05/06/2013   MCV 91.0 05/06/2013   PLT 354 05/06/2013    Lab Results  Component Value Date   CREATININE 0.92 05/10/2013   BUN 12 05/10/2013   NA 136 05/10/2013   K 4.0 05/10/2013   CL  98 05/10/2013   CO2 29 05/10/2013    Lab Results  Component Value Date   ALT 27 04/30/2013   AST 40* 04/30/2013   ALKPHOS 84 04/30/2013   BILITOT 1.1 04/30/2013      Microbiology:   Studies/Results: [place] Extrem Up Right Ltd  05/08/2013   CLINICAL DATA:  Soft tissue abscess. Hand pain. Index finger pain over the flexor tendons.  EXAM: RIGHT UPPER EXTREMITY SOFT TISSUE ULTRASOUND LIMITED  TECHNIQUE: Ultrasound examination was performed including evaluation of the muscles, tendons, joint, and adjacent soft tissues.  COMPARISON:  None.  FINDINGS: Index finger flexor tenosynovitis is present. There is fluid within the flexor tendon sheath and mild heterogeneous echotexture of the tendon itself. On real-time color Doppler interrogation, the periphery of the flexor tendon sheath is hypervascular.  There is no subcutaneous fluid collection or abscess.  IMPRESSION: Right index finger flexor tenosynovitis. This is nonspecific and can be inflammatory or infectious.   Electronically Signed   By: Andreas Newport M.D.   On: 05/08/2013 15:46   Dg Hand Complete Right  05/08/2013   CLINICAL DATA:  Pain and swelling of the index finger. Flexor tenosynovitis on comparison ultrasound  EXAM: RIGHT HAND - COMPLETE 3+ VIEW  COMPARISON:  Ultrasound 05/08/2013  FINDINGS:  There is soft tissue swelling of the 1st digit. No evidence subcutaneous gas. No osseous abnormality.  IMPRESSION: Soft tissue swelling without osseous abnormality.   Electronically Signed   By: Genevive Bi M.D.   On: 05/08/2013 16:30    Assessment/Plan: 1) MRSA bacteremia - clearing infection, no other sites infected.  Finger with tenosynovitis, no indication for operative management.  Infection in left knee, right shoulder, eye.   -I discussed the shoulder exam and findings with the patient and we will cancel the aspiration now since it seems to be improving, we know the organism, and if worsens over the next several weeks, this can be  readdressed.   -vancomycin through 11/11 -weekly cbc, cmp to [facility]  -we will arrange follow up in [facility] in about 2 weeks  2) fever - Tmax 101, I suspect this is from the underlying disseminated infection.  Now afebrile.  -continue with vancomycin, 4-6 weeks through at least Nov 11th -I will add on ultrasound of hand now to facilitate    Staci Righter, MD [facility] [email or web address] [phone] pager   [phone] cell 05/10/2013, 11:38 AM

## 2013-05-10 NOTE — Progress Notes (Signed)
Patient ID: Seth Martinez, male   DOB: 1951/03/05, 62 y.o.   MRN: [id number]  Plan: Continue and discharge on vancomycin  Assessment: Disseminated MRSA bacteremia    Subjectively: He complains of discomfort over the past 24 hours. He says his right shoulder, left MP joint which has limited his range of motion and left hip are still sore and exacerbated on movement.  He complains of chills over night. He says his vision is recovering.    Objectively:  Last recorded: 10/22 0607   BP: 157/89 Pulse: 89  Temp: 98.6 F (37 C) Resp: 20  SpO2: 93     CVS: S1+S2+0 Resp: normal vesicular breathing, no added sounds Abdomen: soft, non tender, gs +ve Other exams normal   Selected Labs (Up to last 3 results from past 72 hours) Report       10/20 0400   10/21 0505   10/22 0508      Sodium 136  134   136     Potassium 3.3   4.0  4.0     Chloride 96  97  98     CO2 30  28  29      BUN 11  12  12      Creatinine 0.83  0.89  0.92     Calcium 8.7  8.4  8.5     Glucose 112   138   138      Right hand IMPRESSION:  Soft tissue swelling without osseous abnormality.  Right upper extremity soft tissue US IMPRESSION:  Right index finger flexor tenosynovitis. This is nonspecific and can  be inflammatory or infectious.  Culture, blood (routine x 2) [[id number]] Collected: 05/03/13 1240 Updated: 05/09/13 0802 Specimen Type: Blood Specimen Description BLOOD LEFT HAND Special Requests BOTTLES DRAWN AEROBIC AND ANAEROBIC 5CC Culture Setup Time - Result: 05/03/2013 17:05 Performed at [facility] Culture - Result: NO GROWTH 5 DAYS Performed at [facility] Report Status 05/09/2013 FINAL

## 2013-05-10 NOTE — Discharge Summary (Signed)
[facility]                                                                                   Seth Martinez, is a 62 y.o. male  DOB [birth year]  MRN [id number].  Admission date:  05/01/2013  Admitting Physician  Hillary Bow, DO  Discharge Date:  05/10/2013   Primary MD  Rudi Heap, MD  Recommendations for primary care physician for things to follow:   Follow CBC BMP and trough levels closely   Admission Diagnosis  Arthritis [716.90] Fever [780.60]  Discharge Diagnosis   disseminated septic arthritis  Principal Problem:   Septic arthritis of multiple joints Active Problems:   Morbid obesity   Staphylococcus aureus septicemia      Past Medical History  Diagnosis Date  . Hyperlipidemia   . Chronic back pain   . NIDDM (non-insulin dependent diabetes mellitus)   . Metabolic syndrome   . Depression   . Insomnia   . Fatigue   . Vertigo   . Joint pain   . Obesity   . Hypertension     Past Surgical History  Procedure Laterality Date  . Rt knee arthroscopic  07/2006  . Fusion lt sacrum with screws  1993  . Fix screws in sacrum  in office  1993  . Back surgery    . Hernia repair    . Tee without cardioversion N/A 05/04/2013    Procedure: TRANSESOPHAGEAL ECHOCARDIOGRAM (TEE);  Surgeon: Donato Schultz, MD;  Location: [facility] ENDOSCOPY;  Service: Cardiovascular;  Laterality: N/A;  . Knee arthroscopy Left 05/04/2013    Procedure: ARTHROSCOPY KNEE WITH WASHOUT;  Surgeon: Sheral Apley, MD;  Location: [facility] OR;  Service: Orthopedics;  Laterality: Left;     Discharge Condition: Stable       Follow-up Information   Follow up with Your PCP. Schedule an appointment as soon as possible for a visit in 3 days.      Follow up with Staci Righter, MD. Schedule an appointment as soon as possible for a visit in 1 week.   Specialty:  Infectious Diseases   Contact information:   [address] [place] [address] [phone]       Follow up with Margarita Rana, D, MD. Schedule an appointment as soon as possible for a visit in 1 week.   Specialty:  Orthopedic Surgery   Contact information:   [address] [place] [address] [phone]       Follow up with [facility], MD. Schedule an appointment as soon as possible for a visit in 1 week.   Specialty:  Ophthalmology   Contact information:   [address] [place] [address] [phone]         Consults obtained - infectious disease, orthopedics,   Discharge Medications      Medication List         acetaminophen 500 MG tablet  Commonly known as:  TYLENOL  Take 500 mg by mouth every 6 (six) hours as needed for pain.     amLODipine 10 MG tablet  Commonly known as:  NORVASC  Take 1 tablet (10  mg total) by mouth daily.     cyclobenzaprine 10 MG tablet  Commonly known as:  FLEXERIL  Take 10 mg by mouth daily as needed for muscle spasms.     DAYQUIL PO  Take 30 mLs by mouth every 6 (six) hours as needed (cold symptoms).     dextromethorphan 30 MG/5ML liquid  Commonly known as:  DELSYM  Take 60 mg by mouth every 12 (twelve) hours as needed for cough.     ibuprofen 200 MG tablet  Commonly known as:  ADVIL,MOTRIN  Take 200 mg by mouth every 6 (six) hours as needed for pain.     NYQUIL PO  Take 30 mLs by mouth at bedtime as needed (cold symptoms).     oxyCODONE-acetaminophen 5-325 MG per tablet  Commonly known as:  PERCOCET/ROXICET  Take 1 tablet by mouth every 8 (eight) hours as needed for pain.     ramipril 2.5 MG capsule  Commonly known as:  ALTACE  Take 1 capsule (2.5 mg total) by mouth daily.     sodium chloride 0.9 % SOLN 250 mL with vancomycin 10 G SOLR 1,250 mg  Inject 1,250 mg into the vein every 8 (eight) hours.         Diet and Activity recommendation: See Discharge Instructions below   Discharge Instructions     Follow with Primary MD Rudi Heap, MD in 5 days   Get CBC, CMP, checked 5 days by Primary MD and  again as instructed by your Primary MD.    Get Medicines reviewed and adjusted.  Please request your Prim.MD to go over all Hospital Tests and Procedure/Radiological results at the follow up, please get all Hospital records sent to your Prim MD by signing hospital release before you go home.  Activity: As tolerated with Full fall precautions use walker/cane & assistance as needed   Diet: Healthy-low carbohydrate  For Heart failure patients - Check your Weight same time everyday, if you gain over 2 pounds, or you develop in leg swelling, experience more shortness of breath or chest pain, call your Primary MD immediately. Follow Cardiac Low Salt Diet and 1.8 lit/day fluid restriction.  Disposition Home    If you experience worsening of your admission symptoms, develop shortness of breath, life threatening emergency, suicidal or homicidal thoughts you must seek medical attention immediately by calling 911 or calling your MD immediately  if symptoms less severe.  You Must read complete instructions/literature along with all the possible adverse reactions/side effects for all the Medicines you take and that have been prescribed to you. Take any new Medicines after you have completely understood and accpet all the possible adverse reactions/side effects.   Do not drive and provide baby sitting services if your were admitted for syncope or siezures until you have seen by Primary MD or a Neurologist and advised to do so again.  Do not drive when taking Pain medications.    Do not take more than prescribed Pain, Sleep and Anxiety Medications  Special Instructions: If you have smoked or chewed Tobacco  in the last 2 yrs please stop smoking, stop any regular Alcohol  and or any Recreational drug use.  Wear Seat belts while driving.   Please note  You were cared for by a hospitalist during your hospital stay. If you have any questions about your discharge medications or the care you received  while you were in the hospital after you are discharged, you can call the unit and asked to  speak with the hospitalist on call if the hospitalist that took care of you is not available. Once you are discharged, your primary care physician will handle any further medical issues. Please note that NO REFILLS for any discharge medications will be authorized once you are discharged, as it is imperative that you return to your primary care physician (or establish a relationship with a primary care physician if you do not have one) for your aftercare needs so that they can reassess your need for medications and monitor your lab values.  Major procedures and Radiology Reports - PLEASE review detailed and final reports for all details, in brief -    TTE  Impressions:  - Normal LV size with mild LV hypertrophy. EF 60-65%. Moderate diastolic dysfunction. Normal RV size and systolic function. No significant valvular abnormalities.   TEE  Impressions:  - No cardiac source of emboli was indentified. No vegetations.    Dg Chest 2 View  05/07/2013   CLINICAL DATA:  Fever with shortness of breath.  EXAM: CHEST  2 VIEW  COMPARISON:  08/24/2012.  FINDINGS: There are lower lung volumes with new linear right upper lobe atelectasis, fissural thickening and central airway thickening. There is no overt pulmonary edema, confluent airspace opacity or pleural effusion. The pulmonary vascularity appears normal. The heart size and mediastinal contours are stable.  IMPRESSION: Increased central airway thickening and interstitial prominence suspicious for bronchitis. No evidence of pneumonia.   Electronically Signed   By: Roxy Horseman M.D.   On: 05/07/2013 16:29   Dg Chest 2 View  04/30/2013   CLINICAL DATA:  Back pain and cough.  EXAM: CHEST  2 VIEW  COMPARISON:  No priors.  FINDINGS: Lung volumes are normal. No consolidative airspace disease. No pleural effusions. No pneumothorax. No pulmonary nodule or mass noted.  Pulmonary vasculature and the cardiomediastinal silhouette are within normal limits. Old healed fracture of the distal right clavicle incidentally noted.  IMPRESSION: 1.  No radiographic evidence of acute cardiopulmonary disease.   Electronically Signed   By: Trudie Reed M.D.   On: 04/30/2013 15:22   Dg Lumbar Spine 2-3 Views  04/30/2013   CLINICAL DATA:  Back pain.  EXAM: LUMBAR SPINE - 2-3 VIEW  COMPARISON:  None.  FINDINGS: There is marked convex right scoliosis. Multilevel degenerative change is present. The patient is status post L4-S1 fusion. Hardware appears intact. No fracture is identified.  IMPRESSION: No acute finding.  Multilevel degenerative change. Status post L4-S1 fusion.   Electronically Signed   By: Drusilla Kanner M.D.   On: 04/30/2013 16:56   Dg Hip Complete Right  04/30/2013   CLINICAL DATA:  Low back pain and right-sided hip pain.  EXAM: RIGHT HIP - COMPLETE 2+ VIEW  COMPARISON:  No priors.  FINDINGS: AP view of the pelvis and AP and lateral views of the right hip demonstrate no definite acute displaced fracture, subluxation, dislocation, joint or soft tissue abnormality. Orthopedic fixation hardware is noted at the lumbosacral junction, and the fixation screw in the low was position on the left appears fractured. No prior studies are available for comparison.  IMPRESSION: 1. No acute radiographic abnormality of the bony pelvis or the left hip. 2. Fracture of the inferior fixation screw on the left side in this patient status post PLIF at L4-S1. Whether or not this is an acute finding or has been present on prior outside studies is uncertain. Correlation with prior outside examinations is suggested.   Electronically Signed  By: Trudie Reed M.D.   On: 04/30/2013 16:50   Dg Knee 2 Views Left  05/02/2013   CLINICAL DATA:  Joint pain.  EXAM: LEFT KNEE - 1-2 VIEW  COMPARISON:  None.  FINDINGS: There is subcutaneous emphysema in a line along the medial knee, presumably from  reported arthrocentesis. Small to moderate knee joint effusion, suprapatellar. No osteolysis. Mild tricompartmental osteoarthritis, without focal or advanced joint narrowing. No fracture, malalignment, or other focal osseous abnormality.  IMPRESSION: 1. Mild to moderate volume knee joint effusion. 2. Mild tricompartmental osteoarthritis. 3. Subcutaneous gas, presumably from reported arthrocentesis.   Electronically Signed   By: Tiburcio Pea M.D.   On: 05/02/2013 00:08   Ct Head Wo Contrast  05/03/2013   CLINICAL DATA:  Losing vision in left eye.  EXAM: CT HEAD WITHOUT CONTRAST  TECHNIQUE: Contiguous axial images were obtained from the base of the skull through the vertex without intravenous contrast.  COMPARISON:  None.  FINDINGS: Skull:No significant abnormality. Mild thickening of the dorsum sella.  Orbits: No evidence of mass. Symmetric appearing globes.  Brain: No evidence of acute abnormality, such as acute infarction, hemorrhage, hydrocephalus, or mass lesion/mass effect. No evidence of pituitary mass, meningioma, or other causes of left eye vision loss.  IMPRESSION: No acute intracranial findings.  No cause for left eye vision loss.   Electronically Signed   By: Tiburcio Pea M.D.   On: 05/03/2013 02:26   Ct Lumbar Spine W Contrast  05/05/2013   CLINICAL DATA:  Back pain with staph bacteremia. Rule out discitis/ osteomyelitis  EXAM: CT LUMBAR SPINE WITH CONTRAST  TECHNIQUE: Multidetector CT imaging of the lumbar spine was performed with intravenous contrast administration. Multiplanar CT image reconstructions were also generated.  CONTRAST:  OMNIPAQUE IOHEXOL 300 MG/ML  SOLN  COMPARISON:  None.  FINDINGS: Image quality degraded by obesity.  Negative for fracture or mass. No CT findings of discitis or osteomyelitis. No endplate erosion. No epidural or paraspinous abscess. Psoas muscles are symmetric and appear normal bilaterally. Left renal cyst.  T12-L1:  Mild spondylosis and mild facet  degeneration  L1-2: Moderate spondylosis and facet degeneration with mild spinal stenosis. There is left foraminal encroachment due to spurring  L2-3: Moderate spondylosis. Left lateral recess and left foraminal encroachment. Moderate facet hypertrophy bilaterally. Moderate spinal stenosis.  L3-4: Mild disk degeneration with moderate facet degeneration. Mild spinal stenosis.  L4-5: Pedicle screw fusion. Hardware appears to be in satisfactory position. There is extensive streak artifact related to hardware obscuring the canal  L5-S1: Bilateral pedicle screw fusion with extensive artifact obscuring the disc space.  IMPRESSION: Suboptimal image quality. The patient is obese. There is extensive streak artifact related to pedicle screw fusion bilaterally at L4-5 and L5-S1.  Spondylosis with mild spinal stenosis at L1-2. Moderate spinal stenosis at L2-3.  No evidence of fracture or spinal infection.   Electronically Signed   By: Marlan Palau M.D.   On: 05/05/2013 13:51   Ct Shoulder Right W Contrast  05/05/2013   CLINICAL DATA:  Right shoulder pain in a patient with staph bacteremia.  EXAM: CT OF THE RIGHT SHOULDER WITH CONTRAST  TECHNIQUE: Multidetector CT imaging was performed following the standard protocol during bolus administration of intravenous contrast.  CONTRAST:  OMNIPAQUE IOHEXOL 300 MG/ML  SOLN  COMPARISON:  None.  FINDINGS: There is a small amount of fluid is subscapularis recess. No fluid within the glenohumeral joint is identified. No soft tissue gas collection is seen. There is no  bony destructive change. Acromioclavicular osteoarthritis is noted. No focal bone lesion is identified. Imaged lung parenchyma demonstrates scattered atelectasis. As visualized CT scan, the rotator cuff appears intact.  IMPRESSION: No CT evidence of septic joint or osteomyelitis.  Acromioclavicular osteoarthritis.   Electronically Signed   By: Drusilla Kanner M.D.   On: 05/05/2013 11:27   Dg Hand 2 View  Right  05/02/2013   CLINICAL DATA:  Pain, no injury  EXAM: RIGHT HAND - 2 VIEW  COMPARISON:  None.  FINDINGS: There is no evidence of fracture or dislocation. There is no evidence of arthropathy or other focal bone abnormality. Mild soft tissue swelling is present  IMPRESSION: No acute osseous abnormality.   Electronically Signed   By: Davonna Belling M.D.   On: 05/02/2013 00:09   [place] Extrem Up Right Ltd  05/08/2013   CLINICAL DATA:  Soft tissue abscess. Hand pain. Index finger pain over the flexor tendons.  EXAM: RIGHT UPPER EXTREMITY SOFT TISSUE ULTRASOUND LIMITED  TECHNIQUE: Ultrasound examination was performed including evaluation of the muscles, tendons, joint, and adjacent soft tissues.  COMPARISON:  None.  FINDINGS: Index finger flexor tenosynovitis is present. There is fluid within the flexor tendon sheath and mild heterogeneous echotexture of the tendon itself. On real-time color Doppler interrogation, the periphery of the flexor tendon sheath is hypervascular.  There is no subcutaneous fluid collection or abscess.  IMPRESSION: Right index finger flexor tenosynovitis. This is nonspecific and can be inflammatory or infectious.   Electronically Signed   By: Andreas Newport M.D.   On: 05/08/2013 15:46   Dg Hand Complete Right  05/08/2013   CLINICAL DATA:  Pain and swelling of the index finger. Flexor tenosynovitis on comparison ultrasound  EXAM: RIGHT HAND - COMPLETE 3+ VIEW  COMPARISON:  Ultrasound 05/08/2013  FINDINGS: There is soft tissue swelling of the 1st digit. No evidence subcutaneous gas. No osseous abnormality.  IMPRESSION: Soft tissue swelling without osseous abnormality.   Electronically Signed   By: Genevive Bi M.D.   On: 05/08/2013 16:30    Micro Results      Recent Results (from the past 240 hour(s))  CULTURE, BLOOD (ROUTINE X 2)     Status: None   Collection Time    05/01/13 10:15 PM      Result Value Range Status   Specimen Description BLOOD FOREARM LEFT IV START   Final    Special Requests BOTTLES DRAWN AEROBIC AND ANAEROBIC 10CC EA   Final   Culture  Setup Time     Final   Value: 05/02/2013 07:40     Performed at [facility]   Culture     Final   Value: METHICILLIN RESISTANT STAPHYLOCOCCUS AUREUS     Note: RIFAMPIN AND GENTAMICIN SHOULD NOT BE USED AS SINGLE DRUGS FOR TREATMENT OF STAPH INFECTIONS. CRITICAL RESULT CALLED TO, READ BACK BY AND VERIFIED WITH: COURTNEY D @1421  05/04/13 BY KRAWS     Note: Gram Stain Report Called to,Read Back By and Verified With: PEACE DORMON ON 05/02/2013 AT 10:06P BY WILEJ     Performed at [facility]   Report Status 05/04/2013 FINAL   Final   Organism ID, Bacteria METHICILLIN RESISTANT STAPHYLOCOCCUS AUREUS   Final  GRAM STAIN     Status: None   Collection Time    05/01/13 10:28 PM      Result Value Range Status   Specimen Description SYNOVIAL FLUID KNEE LEFT   Final   Special Requests Normal  Final   Gram Stain     Final   Value: ABUNDANT WBC PRESENT,BOTH PMN AND MONONUCLEAR     NO ORGANISMS SEEN   Report Status 05/01/2013 FINAL   Final  BODY FLUID CULTURE     Status: None   Collection Time    05/01/13 10:28 PM      Result Value Range Status   Specimen Description SYNOVIAL FLUID KNEE LEFT   Final   Special Requests Normal   Final   Gram Stain     Final   Value: ABUNDANT WBC PRESENT,BOTH PMN AND MONONUCLEAR     NO ORGANISMS SEEN     Performed at [facility]     Performed at [facility]   Culture     Final   Value: MODERATE METHICILLIN RESISTANT STAPHYLOCOCCUS AUREUS     Note: CRITICAL RESULT CALLED TO, READ BACK BY AND VERIFIED WITH: COURTNEY D @1421  05/04/13 BY KRAWS     Performed at [facility]   Report Status 05/04/2013 FINAL   Final   Organism ID, Bacteria METHICILLIN RESISTANT STAPHYLOCOCCUS AUREUS   Final  CULTURE, BLOOD (ROUTINE X 2)     Status: None   Collection Time    05/01/13 11:48 PM      Result Value Range Status   Specimen Description BLOOD LEFT  HAND   Final   Special Requests BOTTLES DRAWN AEROBIC ONLY 8CC   Final   Culture  Setup Time     Final   Value: 05/02/2013 07:39     Performed at [facility]   Culture     Final   Value: STAPHYLOCOCCUS AUREUS     Note: SUSCEPTIBILITIES PERFORMED ON PREVIOUS CULTURE WITHIN THE LAST 5 DAYS.     Note: Gram Stain Report Called to,Read Back By and Verified With: PEACE DORMON ON 05/02/2013 AT 10:06P BY Serafina Mitchell     Performed at [facility]   Report Status 05/04/2013 FINAL   Final  CULTURE, BLOOD (ROUTINE X 2)     Status: None   Collection Time    05/03/13 12:33 PM      Result Value Range Status   Specimen Description BLOOD RIGHT ARM   Final   Special Requests BOTTLES DRAWN AEROBIC AND ANAEROBIC 5CC   Final   Culture  Setup Time     Final   Value: 05/03/2013 17:05     Performed at [facility]   Culture     Final   Value: NO GROWTH 5 DAYS     Performed at [facility]   Report Status 05/09/2013 FINAL   Final  CULTURE, BLOOD (ROUTINE X 2)     Status: None   Collection Time    05/03/13 12:40 PM      Result Value Range Status   Specimen Description BLOOD LEFT HAND   Final   Special Requests BOTTLES DRAWN AEROBIC AND ANAEROBIC 5CC   Final   Culture  Setup Time     Final   Value: 05/03/2013 17:05     Performed at [facility]   Culture     Final   Value: NO GROWTH 5 DAYS     Performed at [facility]   Report Status 05/09/2013 FINAL   Final  SURGICAL PCR SCREEN     Status: Abnormal   Collection Time    05/03/13  5:13 PM      Result Value Range Status   MRSA, PCR POSITIVE (*)  NEGATIVE Final   Staphylococcus aureus POSITIVE (*) NEGATIVE Final   Comment:            The Xpert SA Assay (FDA     approved for NASAL specimens     in patients over 37 years of age),     is one component of     a comprehensive surveillance     program.  Test performance has     been validated by [employer] for patients greater     than or equal to 23  year old.     It is not intended     to diagnose infection nor to     guide or monitor treatment.  GC/CHLAMYDIA PROBE AMP     Status: None   Collection Time    05/07/13  1:05 AM      Result Value Range Status   CT Probe RNA NEGATIVE  NEGATIVE Final   GC Probe RNA NEGATIVE  NEGATIVE Final   Comment: (NOTE)                                                                                            Normal Reference Range: Negative           Assay performed using the Gen-Probe APTIMA COMBO2 (R) Assay.     Acceptable specimen types for this assay include APTIMA Swabs (Unisex,     endocervical, urethral, or vaginal), first void urine, and ThinPrep     liquid based cytology samples.     Performed at [facility]     History of present illness and  Hospital Course:     Kindly see H&P for history of present illness and admission details, please review complete Labs, Consult reports and Test reports for all details in brief Seth Martinez, is a 62 y.o. male, patient with history of   hypertension, chronic back pain with admitted to the hospital with one and a half week history of fever cold chills along with multiple joint pains and aches noticeably left knee, right shoulder, right hip and right index. Patient had blood cultures positive for MRSA and his left knee joint aspirate was also consistent with septic arthritis. He was seen by infectious disease, orthopedics, he was placed on IV vancomycin which needs to be continued till November 11 per ID physician Dr. Luciana Axe, and has a left arm PICC line placed for the same. He will also follow with ID and orthopedics on a close basis post discharge.    Home health nursing has been ordered along with PT OT. He is supposed to get BMP and vancomycin every 3 days by home nursing and the results should be followed to the ID clinic attention of Dr. Staci Righter.    He underwent TEE and TTE which both did not show any vegetations, he does have  chronic stage II diastolic heart failure which is compensated this admission    Patient also developed some problems with his left eye, for which he was seen by ophthalmology, his exam was consistent with changes secondary to metritis most likely from  disseminated staph infection, he will continue to follow with ophthalmology post discharge. CT did not show any acute stroke.     For HTN was started on Norvasc and Altace.     Today   Subjective:   [name] today has no headache,no chest abdominal pain,no new weakness tingling or numbness, feels much better wants to go home today.    Objective:   Blood pressure 157/89, pulse 89, temperature 98.6 F (37 C), temperature source Oral, resp. rate 20, height 5\' 9"  (1.753 m), weight 147.9 kg (326 lb 1 oz), SpO2 93.00%.   Intake/Output Summary (Last 24 hours) at 05/10/13 1100 Last data filed at 05/10/13 0612  Gross per 24 hour  Intake    370 ml  Output   1850 ml  Net  -1480 ml    Exam Awake Alert, Oriented *3, No new F.N deficits, Normal affect [place].AT,PERRAL Supple Neck,No JVD, No cervical lymphadenopathy appriciated.  Symmetrical Chest wall movement, Good air movement bilaterally, CTAB RRR,No Gallops,Rubs or new Murmurs, No Parasternal Heave +ve B.Sounds, Abd Soft, Non tender, No organomegaly appriciated, No rebound -guarding or rigidity. No Cyanosis, Clubbing or edema, No new Rash or bruise, L knee under bandage, R.2nd MCP mild effusion  Data Review   CBC w Diff: Lab Results  Component Value Date   WBC 16.9* 05/06/2013   WBC 6.6 12/28/2012   HGB 13.8 05/06/2013   HGB 16.3 12/28/2012   HCT 41.3 05/06/2013   HCT 46.9 12/28/2012   PLT 354 05/06/2013   LYMPHOPCT 5* 05/01/2013   MONOPCT 12 05/01/2013   EOSPCT 0 05/01/2013   BASOPCT 0 05/01/2013    CMP: Lab Results  Component Value Date   NA 136 05/10/2013   K 4.0 05/10/2013   CL 98 05/10/2013   CO2 29 05/10/2013   BUN 12 05/10/2013   CREATININE 0.92 05/10/2013    CREATININE 1.10 12/28/2012   PROT 7.3 04/30/2013   ALBUMIN 3.2* 04/30/2013   BILITOT 1.1 04/30/2013   ALKPHOS 84 04/30/2013   AST 40* 04/30/2013   ALT 27 04/30/2013  .   Total Time in preparing paper work, data evaluation and todays exam - 35 minutes  Leroy Sea M.D on 05/10/2013 at 11:00 AM  [facility] Office  [phone]

## 2013-05-10 NOTE — Progress Notes (Signed)
Physical Therapy Treatment Patient Details Name: Seth Martinez MRN: [id number] DOB: 02/04/1951 Today's Date: 05/10/2013 Time: 1308-6578 PT Time Calculation (min): 23 min  PT Assessment / Plan / Recommendation  History of Present Illness 62 y.o. male admitted to [facility] on 05/01/13 with complaints of fever, as well as pain in R shoulder, R index finger MCP joint, and L knee.   Joints are red, hot, and swollen.  Fever in ED of 103.1.  Dx with septic arthritis s/p L knee scope on 05/04/13.  Pt is WBAT left knee.     PT Comments   Pt progressing toward PT goals.   Follow Up Recommendations  Home health PT;Supervision/Assistance - 24 hour     Equipment Recommendations  Hospital bed       Frequency Min 5X/week   Progress towards PT Goals Progress towards PT goals: Progressing toward goals  Plan Current plan remains appropriate    Precautions / Restrictions Precautions Precautions: Knee Restrictions LLE Weight Bearing: Weight bearing as tolerated Other Position/Activity Restrictions: no pillow under left knee       Mobility  Bed Mobility Supine to Sit: 4: Min guard;HOB elevated;With rails Sitting - Scoot to Edge of Bed: 5: Supervision Details for Bed Mobility Assistance: decreased assistance needed with hospital bed (pt getting one for home). Transfers Sit to Stand: 5: Supervision;From bed;With upper extremity assist Stand to Sit: 5: Supervision;To bed;With upper extremity assist Ambulation/Gait Ambulation/Gait Assistance: 5: Supervision Ambulation Distance (Feet): 30 Feet Assistive device: Rolling walker Ambulation/Gait Assistance Details: cues for posture and walker position. Limited distance due to pt did not want to leave room. Gait Pattern: Step-through pattern;Decreased stride length;Trunk flexed;Antalgic Gait velocity: decreased    Exercises General Exercises - Lower Extremity Ankle Circles/Pumps: AROM;Strengthening;Both;10 reps;Supine Quad Sets:  AROM;Strengthening;Both;10 reps;Supine Heel Slides: AROM;Strengthening;Both;10 reps;Supine Straight Leg Raises: AROM;Strengthening;Both;5 reps;Supine     PT Goals (current goals can now be found in the care plan section) Acute Rehab PT Goals Patient Stated Goal: to go home safely PT Goal Formulation: With patient/family Time For Goal Achievement: 05/16/13 Potential to Achieve Goals: Good  Visit Information  Last PT Received On: 05/10/13 Assistance Needed: +1 History of Present Illness: 62 y.o. male admitted to [facility] on 05/01/13 with complaints of fever, as well as pain in R shoulder, R index finger MCP joint, and L knee.   Joints are red, hot, and swollen.  Fever in ED of 103.1.  Dx with septic arthritis s/p L knee scope on 05/04/13.  Pt is WBAT left knee.      Subjective Data  Patient Stated Goal: to go home safely   Cognition  Cognition Arousal/Alertness: Awake/alert Behavior During Therapy: WFL for tasks assessed/performed Overall Cognitive Status: Within Functional Limits for tasks assessed       End of Session PT - End of Session Equipment Utilized During Treatment: Gait belt Activity Tolerance: Patient limited by pain Patient left: in bed;with call bell/phone within reach;with family/visitor present Nurse Communication: Mobility status   GP     Sallyanne Kuster 05/10/2013, 2:28 PM  Sallyanne Kuster, PTA Office- [phone]

## 2013-05-12 LAB — MISCELLANEOUS TEST

## 2013-05-14 ENCOUNTER — Telehealth: Payer: Self-pay | Admitting: Internal Medicine

## 2013-05-14 NOTE — Telephone Encounter (Signed)
I was called on Sunday morning by home health RN stating that [name] has developed a fine lacy rash to extremities and torso concerning for allergic reaction that starting on Friday night. I have asked her to tell patient to stop vancomycin infusion. We will check cbc with diff, bmp, and ck. [facility] to start daptomycin 8mg /kg per day on Monday 05/15/13. Labs of interest: Cr 1.16 and baseline CK of 63. We will have them stay with the same length of therapy, and check bmp, ck, and cbc with diff on a weekly basis. Spoke with Reggy Eye at pharmacy for verbal order.

## 2013-05-15 ENCOUNTER — Encounter (HOSPITAL_COMMUNITY): Payer: Self-pay | Admitting: Emergency Medicine

## 2013-05-15 ENCOUNTER — Inpatient Hospital Stay (HOSPITAL_COMMUNITY)
Admission: EM | Admit: 2013-05-15 | Discharge: 2013-05-17 | DRG: 606 | Disposition: A | Discharge status: Home Health | Payer: Medicare Other | Attending: Internal Medicine | Admitting: Internal Medicine

## 2013-05-15 DIAGNOSIS — Z981 Arthrodesis status: Secondary | ICD-10-CM

## 2013-05-15 DIAGNOSIS — E871 Hypo-osmolality and hyponatremia: Secondary | ICD-10-CM | POA: Diagnosis present

## 2013-05-15 DIAGNOSIS — Z881 Allergy status to other antibiotic agents status: Secondary | ICD-10-CM

## 2013-05-15 DIAGNOSIS — Z833 Family history of diabetes mellitus: Secondary | ICD-10-CM

## 2013-05-15 DIAGNOSIS — R21 Rash and other nonspecific skin eruption: Secondary | ICD-10-CM

## 2013-05-15 DIAGNOSIS — M009 Pyogenic arthritis, unspecified: Secondary | ICD-10-CM

## 2013-05-15 DIAGNOSIS — I10 Essential (primary) hypertension: Secondary | ICD-10-CM

## 2013-05-15 DIAGNOSIS — F329 Major depressive disorder, single episode, unspecified: Secondary | ICD-10-CM | POA: Diagnosis present

## 2013-05-15 DIAGNOSIS — A4101 Sepsis due to Methicillin susceptible Staphylococcus aureus: Secondary | ICD-10-CM

## 2013-05-15 DIAGNOSIS — E119 Type 2 diabetes mellitus without complications: Secondary | ICD-10-CM | POA: Diagnosis present

## 2013-05-15 DIAGNOSIS — G8929 Other chronic pain: Secondary | ICD-10-CM | POA: Diagnosis present

## 2013-05-15 DIAGNOSIS — L270 Generalized skin eruption due to drugs and medicaments taken internally: Principal | ICD-10-CM

## 2013-05-15 DIAGNOSIS — Z823 Family history of stroke: Secondary | ICD-10-CM

## 2013-05-15 DIAGNOSIS — R7881 Bacteremia: Secondary | ICD-10-CM

## 2013-05-15 DIAGNOSIS — G4700 Insomnia, unspecified: Secondary | ICD-10-CM | POA: Diagnosis present

## 2013-05-15 DIAGNOSIS — E8881 Metabolic syndrome: Secondary | ICD-10-CM | POA: Diagnosis present

## 2013-05-15 DIAGNOSIS — E669 Obesity, unspecified: Secondary | ICD-10-CM | POA: Diagnosis present

## 2013-05-15 DIAGNOSIS — Z6841 Body Mass Index (BMI) 40.0 and over, adult: Secondary | ICD-10-CM

## 2013-05-15 DIAGNOSIS — D690 Allergic purpura: Secondary | ICD-10-CM | POA: Diagnosis present

## 2013-05-15 DIAGNOSIS — T368X5A Adverse effect of other systemic antibiotics, initial encounter: Secondary | ICD-10-CM | POA: Diagnosis present

## 2013-05-15 DIAGNOSIS — E785 Hyperlipidemia, unspecified: Secondary | ICD-10-CM | POA: Diagnosis present

## 2013-05-15 DIAGNOSIS — Z79899 Other long term (current) drug therapy: Secondary | ICD-10-CM

## 2013-05-15 DIAGNOSIS — Z888 Allergy status to other drugs, medicaments and biological substances status: Secondary | ICD-10-CM

## 2013-05-15 DIAGNOSIS — F3289 Other specified depressive episodes: Secondary | ICD-10-CM | POA: Diagnosis present

## 2013-05-15 DIAGNOSIS — Z880 Allergy status to penicillin: Secondary | ICD-10-CM

## 2013-05-15 HISTORY — DX: Pyogenic arthritis, unspecified: M00.9

## 2013-05-15 HISTORY — DX: Unspecified tear of unspecified meniscus, current injury, unspecified knee, initial encounter: S83.209A

## 2013-05-15 LAB — COMPREHENSIVE METABOLIC PANEL
ALT: 49 U/L (ref 0–53)
AST: 47 U/L — ABNORMAL HIGH (ref 0–37)
Albumin: 2.6 g/dL — ABNORMAL LOW (ref 3.5–5.2)
Calcium: 9.3 mg/dL (ref 8.4–10.5)
Creatinine, Ser: 1.24 mg/dL (ref 0.50–1.35)
Sodium: 128 mEq/L — ABNORMAL LOW (ref 135–145)
Total Protein: 8.1 g/dL (ref 6.0–8.3)

## 2013-05-15 LAB — CBC WITH DIFFERENTIAL/PLATELET
Basophils Absolute: 0.1 10*3/uL (ref 0.0–0.1)
Basophils Relative: 1 % (ref 0–1)
Eosinophils Absolute: 0.1 10*3/uL (ref 0.0–0.7)
Eosinophils Relative: 1 % (ref 0–5)
HCT: 39.9 % (ref 39.0–52.0)
MCHC: 33.8 g/dL (ref 30.0–36.0)
MCV: 91.5 fL (ref 78.0–100.0)
Monocytes Absolute: 1.1 10*3/uL — ABNORMAL HIGH (ref 0.1–1.0)
Neutro Abs: 8.2 10*3/uL — ABNORMAL HIGH (ref 1.7–7.7)
Platelets: 458 10*3/uL — ABNORMAL HIGH (ref 150–400)
RDW: 14 % (ref 11.5–15.5)
WBC: 10.7 10*3/uL — ABNORMAL HIGH (ref 4.0–10.5)

## 2013-05-15 LAB — APTT: aPTT: 40 seconds — ABNORMAL HIGH (ref 24–37)

## 2013-05-15 LAB — PROTIME-INR: Prothrombin Time: 12.9 seconds (ref 11.6–15.2)

## 2013-05-15 MED ORDER — SODIUM CHLORIDE 0.9 % IV SOLN
Freq: Once | INTRAVENOUS | Status: AC
  Administered 2013-05-15: 23:00:00 via INTRAVENOUS

## 2013-05-15 MED ORDER — OXYCODONE-ACETAMINOPHEN 5-325 MG PO TABS
1.0000 | ORAL_TABLET | Freq: Once | ORAL | Status: AC
  Administered 2013-05-15: 1 via ORAL
  Filled 2013-05-15: qty 1

## 2013-05-15 NOTE — ED Notes (Addendum)
Contacted lab regarding delay in PT/PTT/INR, states has already resulted. Results are:  PT: 12.9 PTT: 40 INR: 0.99

## 2013-05-15 NOTE — ED Provider Notes (Signed)
CSN: [id number]     Arrival date & time 05/15/13  1935 History   First MD Initiated Contact with Patient 05/15/13 2010     Chief Complaint  Patient presents with  . Bleeding/Bruising   HPI  History provided by the patient, family and recent medical charts. Patient is a 62 year old male with history of hypertension, diabetes, hyperlipidemia who presents with concerns for diffuse rash and bodyache. Patient was recently admitted to the hospital and treated for septic joint in the knee. He had a washout procedure and was treated with vancomycin in the hospital. He was released on Wednesday 5 days ago. He does have a PICC line in his left upper arm where he is receiving continued doses of vancomycin at home. On Saturday he began to have some redness and rash to his left hand and arm near the PICC line. He informed his doctors and they told him to stop taking vancomycin. He did not use any vancomycin yesterday or today. He was scheduled to use a new antibiotic today however over the course of yesterday his rash became more diffuse. Patient also complains of some mild body aches. He denies any return of fever. Denies any confusion. Denies any weakness or numbness in extremities. No shortness of breath. No throat or mouth swelling. No other aggravating or alleviating factors. No other associated symptoms.    Past Medical History  Diagnosis Date  . Hyperlipidemia   . Chronic back pain   . NIDDM (non-insulin dependent diabetes mellitus)   . Metabolic syndrome   . Depression   . Insomnia   . Fatigue   . Vertigo   . Joint pain   . Obesity   . Hypertension   . Septic arthritis October/2014  . Meniscus tear     bilateral knees   Past Surgical History  Procedure Laterality Date  . Rt knee arthroscopic  07/2006  . Fusion lt sacrum with screws  1993  . Fix screws in sacrum  in office  1993  . Back surgery    . Hernia repair    . Tee without cardioversion N/A 05/04/2013    Procedure:  TRANSESOPHAGEAL ECHOCARDIOGRAM (TEE);  Surgeon: Donato Schultz, MD;  Location: [facility] ENDOSCOPY;  Service: Cardiovascular;  Laterality: N/A;  . Knee arthroscopy Left 05/04/2013    Procedure: ARTHROSCOPY KNEE WITH WASHOUT;  Surgeon: Sheral Apley, MD;  Location: [facility] OR;  Service: Orthopedics;  Laterality: Left;  . Elbow surgery     No family history on file. History  Substance Use Topics  . Smoking status: Never Smoker   . Smokeless tobacco: Not on file  . Alcohol Use: No     Comment: rare    Review of Systems  Constitutional: Negative for fever and chills.  Respiratory: Negative for shortness of breath.   Cardiovascular: Negative for chest pain.  Gastrointestinal: Negative for nausea and vomiting.  Musculoskeletal: Positive for myalgias.  Skin: Positive for rash.  All other systems reviewed and are negative.    Allergies  Morphine and related; Penicillins; Amitriptyline; Antara; Bextra; Cefdinir; Celebrex; Crestor; Escitalopram oxalate; Fish oil; Ibuprofen; Naproxen sodium; Neurontin; Propoxyphene and methadone; Skelaxin; Ultram; Vioxx; and Zocor  Home Medications   Current Outpatient Rx  Name  Route  Sig  Dispense  Refill  . acetaminophen (TYLENOL) 500 MG tablet   Oral   Take 500 mg by mouth every 6 (six) hours as needed for pain.         Marland Kitchen amLODipine (NORVASC) 10 MG tablet  Oral   Take 1 tablet (10 mg total) by mouth daily.   30 tablet   0   . cyclobenzaprine (FLEXERIL) 10 MG tablet   Oral   Take 10 mg by mouth daily as needed for muscle spasms.         Marland Kitchen dextromethorphan (DELSYM) 30 MG/5ML liquid   Oral   Take 60 mg by mouth every 12 (twelve) hours as needed for cough.         Marland Kitchen ibuprofen (ADVIL,MOTRIN) 200 MG tablet   Oral   Take 200 mg by mouth every 6 (six) hours as needed for pain.         Marland Kitchen oxyCODONE-acetaminophen (PERCOCET/ROXICET) 5-325 MG per tablet   Oral   Take 1 tablet by mouth every 8 (eight) hours as needed for pain.   20 tablet   0   .  Pseudoeph-Doxylamine-DM-APAP (NYQUIL PO)   Oral   Take 30 mLs by mouth at bedtime as needed (cold symptoms).         . Pseudoephedrine-APAP-DM (DAYQUIL PO)   Oral   Take 30 mLs by mouth every 6 (six) hours as needed (cold symptoms).         . ramipril (ALTACE) 2.5 MG capsule   Oral   Take 1 capsule (2.5 mg total) by mouth daily.   30 capsule   0   . sodium chloride 0.9 % SOLN 250 mL with vancomycin 10 G SOLR 1,250 mg   Intravenous   Inject 1,250 mg into the vein every 8 (eight) hours.   50 g   0     Please do  BMP'S AND VANCOMYCIN trough every 3 day ...    BP 131/89  Pulse 108  Temp(Src) 98 F (36.7 C) (Oral)  Resp 23  SpO2 97% Physical Exam  Nursing note and vitals reviewed. Constitutional: He is oriented to person, place, and time. He appears well-developed and well-nourished. No distress.  HENT:  Head: Normocephalic and atraumatic.  Eyes: Conjunctivae and EOM are normal. Pupils are equal, round, and reactive to light.  Cardiovascular: Normal rate and regular rhythm.   Pulmonary/Chest: Effort normal and breath sounds normal. No respiratory distress. He has no wheezes. He has no rales.  Abdominal: Soft.  Musculoskeletal:  Well healing incisions over the left knee without secondary signs of infection.  Neurological: He is alert and oriented to person, place, and time. He has normal strength. No sensory deficit.  Skin: Skin is warm.  Diffuse petechial and purpuric rash over the skin. There is some diffuse erythema and mild swelling to the dorsal left hand and fingers.  Psychiatric: He has a normal mood and affect. His behavior is normal.    ED Course  Procedures   DIAGNOSTIC STUDIES: Oxygen Saturation is 97% on room air.    COORDINATION OF CARE:  Nursing notes reviewed. Vital signs reviewed. Initial pt interview and examination performed.   8:20PM patient seen and evaluated. He is resting in bed does not appear in acute distress. Oral respirations. Diffuse  petechial and purpuric rash over skin. Discussed work up plan with pt and family at bedside, which includes lab testing. Pt agrees with plan.  Patient was also seen and evaluated with attending physician. Agrees with plans for a CBC, CMP, PT/INR, PTT.  Spoke with [facility] hospitalist. They will see patient and admit.  Results for orders placed during the hospital encounter of 05/15/13  CBC WITH DIFFERENTIAL      Result Value Range  WBC 10.7 (*) 4.0 - 10.5 K/uL   RBC 4.36  4.22 - 5.81 MIL/uL   Hemoglobin 13.5  13.0 - 17.0 g/dL   HCT 78.2  95.6 - 21.3 %   MCV 91.5  78.0 - 100.0 fL   MCH 31.0  26.0 - 34.0 pg   MCHC 33.8  30.0 - 36.0 g/dL   RDW 08.6  57.8 - 46.9 %   Platelets 458 (*) 150 - 400 K/uL   Neutrophils Relative % 76  43 - 77 %   Neutro Abs 8.2 (*) 1.7 - 7.7 K/uL   Lymphocytes Relative 12  12 - 46 %   Lymphs Abs 1.3  0.7 - 4.0 K/uL   Monocytes Relative 10  3 - 12 %   Monocytes Absolute 1.1 (*) 0.1 - 1.0 K/uL   Eosinophils Relative 1  0 - 5 %   Eosinophils Absolute 0.1  0.0 - 0.7 K/uL   Basophils Relative 1  0 - 1 %   Basophils Absolute 0.1  0.0 - 0.1 K/uL  COMPREHENSIVE METABOLIC PANEL      Result Value Range   Sodium 128 (*) 135 - 145 mEq/L   Potassium 4.5  3.5 - 5.1 mEq/L   Chloride 92 (*) 96 - 112 mEq/L   CO2 23  19 - 32 mEq/L   Glucose, Bld 142 (*) 70 - 99 mg/dL   BUN 17  6 - 23 mg/dL   Creatinine, Ser 6.29  0.50 - 1.35 mg/dL   Calcium 9.3  8.4 - 52.8 mg/dL   Total Protein 8.1  6.0 - 8.3 g/dL   Albumin 2.6 (*) 3.5 - 5.2 g/dL   AST 47 (*) 0 - 37 U/L   ALT 49  0 - 53 U/L   Alkaline Phosphatase 131 (*) 39 - 117 U/L   Total Bilirubin 0.7  0.3 - 1.2 mg/dL   GFR calc non Af Amer 60 (*) >90 mL/min   GFR calc Af Amer 69 (*) >90 mL/min  CK      Result Value Range   Total CK 71  7 - 232 U/L  MAGNESIUM      Result Value Range   Magnesium 2.1  1.5 - 2.5 mg/dL  PHOSPHORUS      Result Value Range   Phosphorus 5.4 (*) 2.3 - 4.6 mg/dL  COMPREHENSIVE METABOLIC PANEL       Result Value Range   Sodium 129 (*) 135 - 145 mEq/L   Potassium 4.5  3.5 - 5.1 mEq/L   Chloride 92 (*) 96 - 112 mEq/L   CO2 23  19 - 32 mEq/L   Glucose, Bld 117 (*) 70 - 99 mg/dL   BUN 18  6 - 23 mg/dL   Creatinine, Ser 4.13  0.50 - 1.35 mg/dL   Calcium 8.8  8.4 - 24.4 mg/dL   Total Protein 7.2  6.0 - 8.3 g/dL   Albumin 2.3 (*) 3.5 - 5.2 g/dL   AST 61 (*) 0 - 37 U/L   ALT 45  0 - 53 U/L   Alkaline Phosphatase 116  39 - 117 U/L   Total Bilirubin 0.6  0.3 - 1.2 mg/dL   GFR calc non Af Amer 64 (*) >90 mL/min   GFR calc Af Amer 74 (*) >90 mL/min  CBC      Result Value Range   WBC 8.4  4.0 - 10.5 K/uL   RBC 4.04 (*) 4.22 - 5.81 MIL/uL   Hemoglobin 12.3 (*)  13.0 - 17.0 g/dL   HCT 16.1 (*) 09.6 - 04.5 %   MCV 91.1  78.0 - 100.0 fL   MCH 30.4  26.0 - 34.0 pg   MCHC 33.4  30.0 - 36.0 g/dL   RDW 40.9  81.1 - 91.4 %   Platelets 404 (*) 150 - 400 K/uL  SODIUM, URINE, RANDOM      Result Value Range   Sodium, Ur 41    CREATININE, URINE, RANDOM      Result Value Range   Creatinine, Urine 174.52    OSMOLALITY, URINE      Result Value Range   Osmolality, Ur 461  390 - 1090 mOsm/kg  CK      Result Value Range   Total CK 68  7 - 232 U/L  GLUCOSE, CAPILLARY      Result Value Range   Glucose-Capillary 329 (*) 70 - 99 mg/dL   Comment 1 Notify RN       MDM   1. Drug rash   2. Septic arthritis of multiple joints   3. Hypertension   4. Rash and nonspecific skin eruption   5. Staphylococcus aureus septicemia        Angus Seller, PA-C 05/16/13 [phone]

## 2013-05-15 NOTE — ED Notes (Signed)
Peter PA at bedside

## 2013-05-15 NOTE — ED Notes (Signed)
Pt presents to ED from home with c/o purpura/petechia diffuse across body with the exception of the upper back and chest. Pt c/o "sore feeling" all over. Pt recently d/c from [facility] with home Vancomycin through a PICC line. Pt report PICC was bleeding this morning with home health nurse. Pt alert, oriented, calm, and cooperative at this time. Daughter and wife at bedside to assist.

## 2013-05-15 NOTE — ED Provider Notes (Signed)
Patient with rash on trunk and extremities onset 2 days ago. Patient has been on home vancomycin for septic arthritis. Vancomycin was stopped yesterday to He also complains of worsening pain and swelling of his left hand. Rash likely drug rash. Medical decision making in light of patient's multiple drug reactions I feel that infectious disease should reevaluate patient and antibiotics started in a supervised setting  Doug Sou, MD 05/15/13 2106

## 2013-05-15 NOTE — ED Provider Notes (Signed)
Spoke with Dr.Doutova, who will evaluate pt for inpatient stay  Doug Sou, MD 05/15/13 2222

## 2013-05-15 NOTE — H&P (Addendum)
PCP:  Rudi Heap, MD [place] [facility] ID:    Chief Complaint:  rash  HPI: Seth Martinez is a 62 y.o. male   has a past medical history of Hyperlipidemia; Chronic back pain; Metabolic syndrome; Depression; Insomnia; Fatigue; Vertigo; Joint pain; Obesity; Hypertension; Septic arthritis (October/2014); and Meniscus tear.   Presented with  2 weeks ago he developed upper respiratory infection followed by polyarthropathy and was admitted with joint swelling and was found to have septic Left knee that was drained. Other joints seemed to be involved as well. He was started on Vanc for 10 days while inpatient and was discharged to Home with piCC line. His vancomycine dosing was changed. 3 days after that 2 days ago he started to have non-blanching purpuric rash first over Left arm around the site of PICC line and then started to spread involving both lower and upper ext. Rash is non pruritic. His Left hand started to swell. He have not had any further fevers. He overall felt well.  No palpitations. He tried to take benadryl but without relief. Denies travel hx but has been to out doors no known tick bites.   Review of Systems:    Pertinent positives include: joint pain  Constitutional:  No weight loss, night sweats, Fevers, chills, fatigue, weight loss  HEENT:  No headaches, Difficulty swallowing,Tooth/dental problems,Sore throat,  No sneezing, itching, ear ache, nasal congestion, post nasal drip,  Cardio-vascular:  No chest pain, Orthopnea, PND, anasarca, dizziness, palpitations.no Bilateral lower extremity swelling  GI:  No heartburn, indigestion, abdominal pain, nausea, vomiting, diarrhea, change in bowel habits, loss of appetite, melena, blood in stool, hematemesis Resp:  no shortness of breath at rest. No dyspnea on exertion, No excess mucus, no productive cough, No non-productive cough, No coughing up of blood.No change in color of mucus.No wheezing. Skin:  no rash or lesions.  No jaundice GU:  no dysuria, change in color of urine, no urgency or frequency. No straining to urinate.  No flank pain.  Musculoskeletal:  No joint pain or no joint swelling. No decreased range of motion. No back pain.  Psych:  No change in mood or affect. No depression or anxiety. No memory loss.  Neuro: no localizing neurological complaints, no tingling, no weakness, no double vision, no gait abnormality, no slurred speech, no confusion  Otherwise ROS are negative except for above, 10 systems were reviewed  Past Medical History: Past Medical History  Diagnosis Date  . Hyperlipidemia   . Chronic back pain   . Metabolic syndrome   . Depression   . Insomnia   . Fatigue   . Vertigo   . Joint pain   . Obesity   . Hypertension   . Septic arthritis October/2014  . Meniscus tear     bilateral knees   Past Surgical History  Procedure Laterality Date  . Rt knee arthroscopic  07/2006  . Fusion lt sacrum with screws  1993  . Fix screws in sacrum  in office  1993  . Back surgery    . Hernia repair    . Tee without cardioversion N/A 05/04/2013    Procedure: TRANSESOPHAGEAL ECHOCARDIOGRAM (TEE);  Surgeon: Donato Schultz, MD;  Location: [facility] ENDOSCOPY;  Service: Cardiovascular;  Laterality: N/A;  . Knee arthroscopy Left 05/04/2013    Procedure: ARTHROSCOPY KNEE WITH WASHOUT;  Surgeon: Sheral Apley, MD;  Location: [facility] OR;  Service: Orthopedics;  Laterality: Left;  . Elbow surgery       Medications: Prior to  Admission medications   Medication Sig Start Date End Date Taking? Authorizing Provider  acetaminophen (TYLENOL) 500 MG tablet Take 500 mg by mouth every 6 (six) hours as needed for pain.   Yes Historical Provider, MD  amLODipine (NORVASC) 10 MG tablet Take 1 tablet (10 mg total) by mouth daily. 05/10/13  Yes Leroy Sea, MD  cyclobenzaprine (FLEXERIL) 10 MG tablet Take 10 mg by mouth daily as needed for muscle spasms.   Yes Historical Provider, MD  oxyCODONE-acetaminophen  (PERCOCET/ROXICET) 5-325 MG per tablet Take 1 tablet by mouth every 8 (eight) hours as needed for pain. 05/10/13  Yes Leroy Sea, MD  ramipril (ALTACE) 2.5 MG capsule Take 1 capsule (2.5 mg total) by mouth daily. 05/10/13  Yes Leroy Sea, MD  sodium chloride 0.9 % SOLN 250 mL with vancomycin 10 G SOLR Inject 1,750 mg into the vein every 12 (twelve) hours. Last dose was Saturday night 7:00pm 05-13-13 05/10/13 05/31/13 Yes Leroy Sea, MD    Allergies:   Allergies  Allergen Reactions  . Morphine And Related Anaphylaxis  . Penicillins Anaphylaxis  . Amitriptyline     unk  . Antara [Fenofibrate Micronized]     unk  . Bextra [Valdecoxib]     unk  . Cefdinir     unk  . Celebrex [Celecoxib]     unk  . Crestor [Rosuvastatin Calcium]     unk  . Escitalopram Oxalate     unk  . Fish Oil     unk  . Ibuprofen     unk  . Naproxen Sodium     unk  . Neurontin [Gabapentin]     unk  . Propoxyphene And Methadone     unk  . Skelaxin     unk  . Ultram [Tramadol]     unk  . Vioxx [Rofecoxib]     unk  . Zocor [Simvastatin]     unk    Social History:  Ambulatory   independently   Lives at  Home with family   reports that he has never smoked. He does not have any smokeless tobacco history on file. He reports that he does not drink alcohol or use illicit drugs.   Family History: family history includes Diabetes type II in his brother; Stroke in his father.    Physical Exam: Patient Vitals for the past 24 hrs:  BP Temp Temp src Pulse Resp SpO2  05/15/13 2215 137/78 mmHg - - 80 21 93 %  05/15/13 2200 135/77 mmHg - - 80 15 92 %  05/15/13 2145 135/79 mmHg - - 75 20 93 %  05/15/13 2130 129/82 mmHg - - 81 24 92 %  05/15/13 2115 138/87 mmHg - - 81 15 92 %  05/15/13 2100 115/80 mmHg - - 85 19 95 %  05/15/13 2000 120/86 mmHg - - - - -  05/15/13 1942 131/89 mmHg 98 F (36.7 C) Oral 108 23 97 %    1. General:  in No Acute distress 2. Psychological: Alert and    Oriented 3. Head/ENT:   Moist   Mucous Membranes, few petechia noted                          Head Non traumatic, neck supple                          Normal  Dentition 4. SKIN: normal  Skin turgor,  Skin clean Dry and intact  diffuse purpuric rash present nonblanching nonpalpable. 5. Heart: Regular rate and rhythm no Murmur, Rub or gallop 6. Lungs: Clear to auscultation bilaterally, no wheezes or crackles   7. Abdomen: Soft, non-tender, Non distended 8. Lower extremities: no clubbing, cyanosis, or edema. Swelling noted over left upper extremity.  9. Neurologically Grossly intact, moving all 4 extremities equally 10. MSK: Normal range of motion  body mass index is unknown because there is no weight on file.   Labs on Admission:   Recent Labs  05/15/13 2020  NA 128*  K 4.5  CL 92*  CO2 23  GLUCOSE 142*  BUN 17  CREATININE 1.24  CALCIUM 9.3    Recent Labs  05/15/13 2020  AST 47*  ALT 49  ALKPHOS 131*  BILITOT 0.7  PROT 8.1  ALBUMIN 2.6*   No results found for this basename: LIPASE, AMYLASE,  in the last 72 hours  Recent Labs  05/15/13 2020  WBC 10.7*  NEUTROABS 8.2*  HGB 13.5  HCT 39.9  MCV 91.5  PLT 458*   No results found for this basename: CKTOTAL, CKMB, CKMBINDEX, TROPONINI,  in the last 72 hours No results found for this basename: TSH, T4TOTAL, FREET3, T3FREE, THYROIDAB,  in the last 72 hours No results found for this basename: VITAMINB12, FOLATE, FERRITIN, TIBC, IRON, RETICCTPCT,  in the last 72 hours Lab Results  Component Value Date   HGBA1C 5.9* 05/05/2013    The CrCl is unknown because both a height and weight (above a minimum accepted value) are required for this calculation. ABG No results found for this basename: phart, pco2, po2, hco3, tco2, acidbasedef, o2sat     No results found for this basename: DDIMER     Other results:  I have pearsonaly reviewed this: ECG REPORT   Cultures:    Component Value Date/Time   SDES BLOOD LEFT  HAND 05/03/2013 1240   SPECREQUEST BOTTLES DRAWN AEROBIC AND ANAEROBIC 5CC 05/03/2013 1240   CULT  Value: NO GROWTH 5 DAYS Performed at [facility] 05/03/2013 1240   REPTSTATUS 05/09/2013 FINAL 05/03/2013 1240       Radiological Exams on Admission: No results found.  Chart has been reviewed  Assessment/Plan  62 year old gentleman with recent MRSA septicemia and septic joint presenting with purpuric rash  Present on Admission:  . Staphylococcus aureus septicemia - although reaction to vancomycin somewhat less likely Will change to daptomycin as per recommendations of ID obtain blood cultures continue to follow creatinine and platelets.   patient appears to be appears to be nontoxic. ID consult called patient will be seen in a.m.  Marland Kitchen Hypertension - continue home medications  . Rash and nonspecific skin eruption - etiology at this point unclear drug eruption versus vasculitis versus benign purpura. He may benefit from biopsy to make a definitive diagnosis. Patient had had a regular recent echo gram as well as TEE. Will also dc norvasc since that was started recently just before the rash started   Prophylaxis: SCD  Protonix  CODE STATUS: FULL CODE  Other plan as per orders.  I have spent a total of 65 min on this admission time taken to discuss his care with ID who will see him in the morning  Demarquis Osley 05/15/2013, 10:45 PM

## 2013-05-16 ENCOUNTER — Ambulatory Visit: Payer: Medicare Other | Admitting: Family Medicine

## 2013-05-16 ENCOUNTER — Encounter (HOSPITAL_COMMUNITY): Payer: Self-pay | Admitting: *Deleted

## 2013-05-16 DIAGNOSIS — R7881 Bacteremia: Secondary | ICD-10-CM

## 2013-05-16 DIAGNOSIS — T368X5A Adverse effect of other systemic antibiotics, initial encounter: Secondary | ICD-10-CM

## 2013-05-16 DIAGNOSIS — L270 Generalized skin eruption due to drugs and medicaments taken internally: Principal | ICD-10-CM

## 2013-05-16 DIAGNOSIS — A4902 Methicillin resistant Staphylococcus aureus infection, unspecified site: Secondary | ICD-10-CM

## 2013-05-16 LAB — CBC
Hemoglobin: 12.3 g/dL — ABNORMAL LOW (ref 13.0–17.0)
MCH: 30.4 pg (ref 26.0–34.0)
MCHC: 33.4 g/dL (ref 30.0–36.0)
MCV: 91.1 fL (ref 78.0–100.0)
Platelets: 404 10*3/uL — ABNORMAL HIGH (ref 150–400)
RDW: 13.9 % (ref 11.5–15.5)

## 2013-05-16 LAB — OSMOLALITY: Osmolality: 283 mOsm/kg (ref 275–300)

## 2013-05-16 LAB — MAGNESIUM: Magnesium: 2.1 mg/dL (ref 1.5–2.5)

## 2013-05-16 LAB — COMPREHENSIVE METABOLIC PANEL
ALT: 45 U/L (ref 0–53)
AST: 61 U/L — ABNORMAL HIGH (ref 0–37)
Alkaline Phosphatase: 116 U/L (ref 39–117)
CO2: 23 mEq/L (ref 19–32)
Calcium: 8.8 mg/dL (ref 8.4–10.5)
Chloride: 92 mEq/L — ABNORMAL LOW (ref 96–112)
GFR calc Af Amer: 74 mL/min — ABNORMAL LOW (ref >90)
GFR calc non Af Amer: 64 mL/min — ABNORMAL LOW (ref >90)
Glucose, Bld: 117 mg/dL — ABNORMAL HIGH (ref 70–99)
Sodium: 129 mEq/L — ABNORMAL LOW (ref 135–145)
Total Bilirubin: 0.6 mg/dL (ref 0.3–1.2)

## 2013-05-16 LAB — OSMOLALITY, URINE: Osmolality, Ur: 180 mOsm/kg — ABNORMAL LOW (ref 390–1090)

## 2013-05-16 LAB — TSH: TSH: 3.181 u[IU]/mL (ref 0.350–4.500)

## 2013-05-16 LAB — SODIUM, URINE, RANDOM
Sodium, Ur: 41 mEq/L
Sodium, Ur: 33 mEq/L

## 2013-05-16 LAB — CK: Total CK: 71 U/L (ref 7–232)

## 2013-05-16 MED ORDER — SODIUM CHLORIDE 0.9 % IV SOLN
1000.0000 mg | INTRAVENOUS | Status: DC
  Administered 2013-05-16 – 2013-05-17 (×2): 1000 mg via INTRAVENOUS
  Filled 2013-05-16 (×3): qty 20

## 2013-05-16 MED ORDER — ACETAMINOPHEN 325 MG PO TABS: 650.0000 mg | ORAL_TABLET | Freq: Four times a day (QID) | ORAL | Status: DC | PRN

## 2013-05-16 MED ORDER — ACETAMINOPHEN 650 MG RE SUPP: 650.0000 mg | Freq: Four times a day (QID) | RECTAL | Status: DC | PRN

## 2013-05-16 MED ORDER — ONDANSETRON HCL 4 MG PO TABS: 4.0000 mg | ORAL_TABLET | Freq: Four times a day (QID) | ORAL | Status: DC | PRN

## 2013-05-16 MED ORDER — SODIUM CHLORIDE 0.9 % IJ SOLN: 3.0000 mL | Freq: Two times a day (BID) | INTRAMUSCULAR | Status: DC

## 2013-05-16 MED ORDER — CYCLOBENZAPRINE HCL 10 MG PO TABS: 10.0000 mg | ORAL_TABLET | Freq: Every day | ORAL | Status: DC | PRN

## 2013-05-16 MED ORDER — OXYCODONE-ACETAMINOPHEN 5-325 MG PO TABS
1.0000 | ORAL_TABLET | Freq: Four times a day (QID) | ORAL | Status: DC | PRN
  Administered 2013-05-16 – 2013-05-17 (×5): 1 via ORAL
  Filled 2013-05-16 (×5): qty 1

## 2013-05-16 MED ORDER — SODIUM CHLORIDE 0.9 % IJ SOLN
10.0000 mL | INTRAMUSCULAR | Status: DC | PRN
  Administered 2013-05-16 – 2013-05-17 (×2): 10 mL

## 2013-05-16 MED ORDER — RAMIPRIL 2.5 MG PO CAPS
2.5000 mg | ORAL_CAPSULE | Freq: Every day | ORAL | Status: DC
  Administered 2013-05-16 – 2013-05-17 (×2): 2.5 mg via ORAL
  Filled 2013-05-16 (×2): qty 1

## 2013-05-16 MED ORDER — ONDANSETRON HCL 4 MG/2ML IJ SOLN: 4.0000 mg | Freq: Four times a day (QID) | INTRAMUSCULAR | Status: DC | PRN

## 2013-05-16 MED ORDER — ENOXAPARIN SODIUM 40 MG/0.4ML ~~LOC~~ SOLN
40.0000 mg | SUBCUTANEOUS | Status: DC
  Administered 2013-05-16: 40 mg via SUBCUTANEOUS
  Filled 2013-05-16 (×2): qty 0.4

## 2013-05-16 NOTE — Care Management Note (Signed)
    Page 1 of 2   05/17/2013     1:37:08 PM   CARE MANAGEMENT NOTE 05/17/2013  Patient:  Seth Martinez, Seth Martinez   Account Number:  [id number]  Date Initiated:  05/16/2013  Documentation initiated by:  Letha Cape  Subjective/Objective Assessment:   dx staphylococcus aureus  admit- lives with spouse.  Patient is active with [facility]  for [facility] for IV ABX.     Action/Plan:   Anticipated DC Date:  05/17/2013   Anticipated DC Plan:  HOME W HOME HEALTH SERVICES      DC Planning Services  CM consult      [facility] Choice  HOME HEALTH  Resumption Of Svcs/PTA Provider   Choice offered to / List presented to:  C-1 Patient        HH arranged  HH-1 RN      [facility] agency  [facility]   Status of service:  Completed, signed off Medicare Important Message given?   (If response is "NO", the following Medicare IM given date fields will be blank) Date Medicare IM given:   Date Additional Medicare IM given:    Discharge Disposition:  HOME W HOME HEALTH SERVICES  Per UR Regulation:  Reviewed for med. necessity/level of care/duration of stay  If discussed at Long Length of Stay Meetings, dates discussed:    Comments:  05/17/13 13:36 Letha Cape RN,BSN [phone] patient dc to home with home health services.  05/16/13 12:24 Letha Cape RN, BSN [phone] patient lives with spouse, patient is active with [facility] for [facility] for IV abx, will need resume order for [facility].  NCM will continue to follow for dc needs.

## 2013-05-16 NOTE — ED Provider Notes (Signed)
Medical screening examination/treatment/procedure(s) were conducted as a shared visit with non-physician practitioner(s) and myself.  I personally evaluated the patient during the encounter.  EKG Interpretation     Ventricular Rate:    PR Interval:    QRS Duration:   QT Interval:    QTC Calculation:   R Axis:     Text Interpretation:               Doug Sou, MD 05/16/13 1228

## 2013-05-16 NOTE — Progress Notes (Signed)
[facility]                                                                                Patient Demographics  Seth Martinez, is a 62 y.o. male, DOB - [birth year], UJW:[id number]  Admit date - 05/15/2013   Admitting Physician Therisa Doyne, MD  Outpatient Primary MD for the patient is Rudi Heap, MD  LOS - 1   Chief Complaint  Patient presents with  . Bleeding/Bruising        Assessment & Plan    1. L knee septic arthritis causing staph aureus bacteremia in the past, patient took 13 days of IV vancomycin and then developed diffuse morbilliform non-itching nonblanching skin rash which was predominantly in his extremities, was admitted yesterday for the rash, ID has seen the patient, vancomycin has been switched to daptomycin. Rash has stabilized. Monitor 24 hours, patient has no mucous membrane involvement. This rash clearly appears to be a drug eruption.    2. Hypertension. Stable on present regimen continue    3. Mild hyponatremia after he received fluid initially. Fluids have been stopped, have ordered urine sodium, urine osmolality and serum osmolarity. Repeat BMP in the morning.      Code Status: Full  Family Communication:    Disposition Plan: Home   Procedures     Consults  ID   Medications  Scheduled Meds: . DAPTOmycin (CUBICIN)  IV  1,000 mg Intravenous Q24H  . ramipril  2.5 mg Oral Daily  . sodium chloride  3 mL Intravenous Q12H   Continuous Infusions:  PRN Meds:.acetaminophen, acetaminophen, cyclobenzaprine, ondansetron (ZOFRAN) IV, ondansetron, oxyCODONE-acetaminophen, sodium chloride  DVT Prophylaxis  Lovenox has been initiated  Lab Results  Component Value Date   PLT 404* 05/16/2013    Antibiotics     Anti-infectives   Start     Dose/Rate Route Frequency Ordered Stop   05/16/13 0100  DAPTOmycin (CUBICIN) 1,000 mg in sodium chloride 0.9 % IVPB     1,000 mg 240 mL/hr over 30 Minutes Intravenous Every 24 hours  05/16/13 0054            Subjective:   Seth Martinez today has, No headache, No chest pain, No abdominal pain - No Nausea, No new weakness tingling or numbness, No Cough - SOB.  Rash has stabilized  Objective:   Filed Vitals:   05/15/13 2315 05/15/13 2345 05/16/13 0051 05/16/13 0614  BP: 139/78 146/94 122/78 125/76  Pulse: 81 84 77 74  Temp:   98.4 F (36.9 C) 98.3 F (36.8 C)  TempSrc:   Oral Oral  Resp: 19 27 18 18   Height:   5' 10.8" (1.798 m)   Weight:  148 kg (326 lb 4.5 oz) 133.267 kg (293 lb 12.8 oz)   SpO2: 93% 95% 91% 95%    Wt Readings from Last 3 Encounters:  05/16/13 133.267 kg (293 lb 12.8 oz)  05/02/13 147.9 kg (326 lb 1 oz)  05/02/13 147.9 kg (326 lb 1 oz)     Intake/Output Summary (Last 24 hours) at 05/16/13 1041 Last data filed at 05/16/13 0537  Gross per 24 hour  Intake    120 ml  Output  750 ml  Net   -630 ml    Exam Awake Alert, Oriented X 3, No new F.N deficits, Normal affect [place].AT,PERRAL Supple Neck,No JVD, No cervical lymphadenopathy appriciated.  Symmetrical Chest wall movement, Good air movement bilaterally, CTAB RRR,No Gallops,Rubs or new Murmurs, No Parasternal Heave +ve B.Sounds, Abd Soft, Non tender, No organomegaly appriciated, No rebound - guarding or rigidity. No Cyanosis, Clubbing or edema,  Diffuse morbilliform rash noted mostly in the extremities, some in the lower abdomen, chest and back spared. No mucous membrane involvement, this rash is not itchy, nonblanching and palpable.   Data Review         CBC  Recent Labs Lab 05/15/13 2020 05/16/13 0200  WBC 10.7* 8.4  HGB 13.5 12.3*  HCT 39.9 36.8*  PLT 458* 404*  MCV 91.5 91.1  MCH 31.0 30.4  MCHC 33.8 33.4  RDW 14.0 13.9  LYMPHSABS 1.3  --   MONOABS 1.1*  --   EOSABS 0.1  --   BASOSABS 0.1  --     Chemistries   Recent Labs Lab 05/10/13 0508 05/15/13 2020 05/16/13 0200  NA 136 128* 129*  K 4.0 4.5 4.5  CL 98 92* 92*  CO2 29 23 23   GLUCOSE  138* 142* 117*  BUN 12 17 18   CREATININE 0.92 1.24 1.17  CALCIUM 8.5 9.3 8.8  MG  --   --  2.1  AST  --  47* 61*  ALT  --  49 45  ALKPHOS  --  131* 116  BILITOT  --  0.7 0.6   ------------------------------------------------------------------------------------------------------------------ estimated creatinine clearance is 88.6 ml/min (by C-G formula based on Cr of 1.17). ------------------------------------------------------------------------------------------------------------------ No results found for this basename: HGBA1C,  in the last 72 hours ------------------------------------------------------------------------------------------------------------------ No results found for this basename: CHOL, HDL, LDLCALC, TRIG, CHOLHDL, LDLDIRECT,  in the last 72 hours ------------------------------------------------------------------------------------------------------------------ No results found for this basename: TSH, T4TOTAL, FREET3, T3FREE, THYROIDAB,  in the last 72 hours ------------------------------------------------------------------------------------------------------------------ No results found for this basename: VITAMINB12, FOLATE, FERRITIN, TIBC, IRON, RETICCTPCT,  in the last 72 hours  Coagulation profile  Recent Labs Lab 05/15/13 2100  INR 0.99    No results found for this basename: DDIMER,  in the last 72 hours  Cardiac Enzymes No results found for this basename: CK, CKMB, TROPONINI, MYOGLOBIN,  in the last 168 hours ------------------------------------------------------------------------------------------------------------------ No components found with this basename: POCBNP,      Time Spent in minutes   35   Seth Martinez K M.D on 05/16/2013 at 10:41 AM  Between 7am to 7pm - Pager - [phone]  After 7pm go to [email or web address] - password [email or web address]  And look for the night coverage person covering for me after hours  [facility] Office   [phone]

## 2013-05-16 NOTE — Progress Notes (Signed)
Patient admitted to unit from ED. Patient is A&Ox3. Patient lives at home with his wife. Patient has purplish red rash to abd/chest, bilateral arms, bilateral legs/feet and buttocks. Patient's left hand is swollen.Patient placed on contact precautions due to +MRSA PCR on 05/03/13.  Patient has single lumen PICC to left upper arm from home. Patient placed on tele. Oriented patient and wife to unit and room.  Patient placed on bedalarm. Told patient and pt's wife to call for assistance before getting up. Patient and wife stated understanding. Will continue to monitor patient. Nelda Marseille, RN

## 2013-05-16 NOTE — Progress Notes (Signed)
[facility]  Patient Status: Active (receiving services up to time of hospitalization)  [facility] is providing the following services: RN and Home Infusion Services (teaching and education will be done by nurse in the home with patient and caregiver)  If patient discharges after hours, please call [phone].   Seth Martinez 05/16/2013, 10:03 AM

## 2013-05-16 NOTE — H&P (Signed)
[facility]     Reason for Consult: rash    Referring Physician: Dr. Thedore Mins  Active Problems:   Hypertension   Staphylococcus aureus septicemia   Rash and nonspecific skin eruption   . DAPTOmycin (CUBICIN)  IV  1,000 mg Intravenous Q24H  . ramipril  2.5 mg Oral Daily  . sodium chloride  3 mL Intravenous Q12H    Recommendations: Continue daptomycin at about 8mg /kg through Nov 11th   Assessment: He has a vasculitic rash with the only new drug is vancomycin.  He was changed to daptomycin but was sent in to have him challanged with it here.   Patient is requesting to stay another day to be sure no new reaction.    Thanks for consult and we are available if needed.    Antibiotics: daptomycin day 1 Vancomycin 14 days  HPI: Seth Martinez is a 62 y.o. male who presented earlier in the month with left knee septic arthritis, vitritis, and blood cultures with MRSA who was home on vancomycin and presents with vasculitic rash since Saturday.  Was going to start daptomycin at home but concerned of rash by home health and patient and sent in for evaluation.  No fever, no chills, otherwise feels his usual self.  No pruritis.  Has daptomycin at home ready to use.     Review of Systems: A comprehensive review of systems was negative.  Past Medical History  Diagnosis Date  . Hyperlipidemia   . Chronic back pain   . Metabolic syndrome   . Depression   . Insomnia   . Fatigue   . Vertigo   . Joint pain   . Obesity   . Hypertension   . Septic arthritis October/2014  . Meniscus tear     bilateral knees    History  Substance Use Topics  . Smoking status: Never Smoker   . Smokeless tobacco: Not on file  . Alcohol Use: No     Comment: rare    Family History  Problem Relation Age of Onset  . Stroke Father   . Diabetes type II Brother    Allergies  Allergen Reactions  . Morphine And Related Anaphylaxis  . Penicillins Anaphylaxis  .  Amitriptyline     unk  . Antara [Fenofibrate Micronized]     unk  . Bextra [Valdecoxib]     unk  . Cefdinir     unk  . Celebrex [Celecoxib]     unk  . Crestor [Rosuvastatin Calcium]     unk  . Escitalopram Oxalate     unk  . Fish Oil     unk  . Ibuprofen     unk  . Naproxen Sodium     unk  . Neurontin [Gabapentin]     unk  . Propoxyphene And Methadone     unk  . Skelaxin     unk  . Ultram [Tramadol]     unk  . Vioxx [Rofecoxib]     unk  . Zocor [Simvastatin]     unk    OBJECTIVE: Blood pressure 125/76, pulse 74, temperature 98.3 F (36.8 C), temperature source Oral, resp. rate 18, height 5' 10.8" (1.798 m), weight 293 lb 12.8 oz (133.267 kg), SpO2 95.00%. General: awake, alert, nad Skin: diffuse vasculitic rash, non blanching, macular Lungs: CTA B Cor: RRR Abdomen: sof6t, nt, nd Ext: no edema  Microbiology:   Staci Righter, MD [facility] [email or web address] [phone] pager  [phone] cell 05/16/2013, 10:25 AM

## 2013-05-16 NOTE — H&P (Signed)
Seth Martinez is an 62 y.o. male.  Plan: Switch to daptomycin  Assessment: Generalized vasculitic rash due to vancomycin  HPI  62 year old male presents with a maculopapular, erythematous rash for the past 3 days. It is non-pruritic in nature, started out on the arms, progressed to all over the body and is especially prominent over the limbs. He was recently hospitalized and diagnosed as disseminated MRSA bacteremia on 10/15 for which he was put on vancomycin for 14 days. He has no other systemic complaints such as fever, chills, sweats, shortness of breath or joint paint.   Social history: -ve  Family history: brother has DM  Allergies: morphine, penicillin  PMH: HTN, disseminated MRSA bacteremia,   Review of Systems  Constitutional: Negative for fever, chills and diaphoresis.  HENT: Negative.   Eyes: Negative.   Respiratory: Negative.   Cardiovascular: Negative.   Gastrointestinal: Negative.   Genitourinary: Negative.   Musculoskeletal: Negative.   Skin: Positive for rash. Negative for itching.  Neurological: Negative.   Endo/Heme/Allergies: Negative.   all other exams normal  Blood pressure 125/76, pulse 74, temperature 98.3 F (36.8 C), temperature source Oral, resp. rate 18, height 5' 10.8" (1.798 m), weight 133.267 kg (293 lb 12.8 oz), SpO2 95.00%. Physical Exam  Constitutional: He is oriented to person, place, and time. He appears well-developed and well-nourished.  Cardiovascular: Normal rate, regular rhythm, normal heart sounds and intact distal pulses.   Respiratory: Effort normal and breath sounds normal.  GI: Soft. Bowel sounds are normal.  Neurological: He is alert and oriented to person, place, and time. He has normal reflexes.  Skin: Skin is warm and dry. Rash noted.   rash: vasculitic, generalized, more prominent over the limbs, maculo-papular, non blanchable Other exams unremarkable   10/27 2020  10/28 0200  WBC 10.7 8.4 RBC 4.36 4.04 Hemoglobin 13.5  12.3 HCT 39.9 36.8 Platelets 458 404 Sodium 128 129 Potassium 4.5 4.5 Chloride 92 92 CO2 23 23 BUN 17 18 Creatinine 1.24 1.17 Calcium 9.3 8.8 Glucose 142 117    Felicita Nuncio 05/16/2013, 9:41 AM

## 2013-05-16 NOTE — Progress Notes (Signed)
ANTIBIOTIC CONSULT NOTE - INITIAL  Pharmacy Consult for daptomycin  Indication: bacteremia with septic joint   Allergies  Allergen Reactions  . Morphine And Related Anaphylaxis  . Penicillins Anaphylaxis  . Amitriptyline     unk  . Antara [Fenofibrate Micronized]     unk  . Bextra [Valdecoxib]     unk  . Cefdinir     unk  . Celebrex [Celecoxib]     unk  . Crestor [Rosuvastatin Calcium]     unk  . Escitalopram Oxalate     unk  . Fish Oil     unk  . Ibuprofen     unk  . Naproxen Sodium     unk  . Neurontin [Gabapentin]     unk  . Propoxyphene And Methadone     unk  . Skelaxin     unk  . Ultram [Tramadol]     unk  . Vioxx [Rofecoxib]     unk  . Zocor [Simvastatin]     unk    Patient Measurements: Height: 5' 10.8" (179.8 cm) Weight: 293 lb 12.8 oz (133.267 kg) IBW/kg (Calculated) : 74.84 Adjusted Body Weight:   Vital Signs: Temp: 98.4 F (36.9 C) (10/28 0051) Temp src: Oral (10/28 0051) BP: 122/78 mmHg (10/28 0051) Pulse Rate: 77 (10/28 0051) Intake/Output from previous day: 10/27 0701 - 10/28 0700 In: -  Out: 350 [Urine:350] Intake/Output from this shift: Total I/O In: -  Out: 350 [Urine:350]  Labs:  Recent Labs  05/15/13 2020  WBC 10.7*  HGB 13.5  PLT 458*  CREATININE 1.24   Estimated Creatinine Clearance: 83.6 ml/min (by C-G formula based on Cr of 1.24). No results found for this basename: VANCOTROUGH, VANCOPEAK, VANCORANDOM, GENTTROUGH, GENTPEAK, GENTRANDOM, TOBRATROUGH, TOBRAPEAK, TOBRARND, AMIKACINPEAK, AMIKACINTROU, AMIKACIN,  in the last 72 hours   Microbiology:   Medical History: Past Medical History  Diagnosis Date  . Hyperlipidemia   . Chronic back pain   . Metabolic syndrome   . Depression   . Insomnia   . Fatigue   . Vertigo   . Joint pain   . Obesity   . Hypertension   . Septic arthritis October/2014  . Meniscus tear     bilateral knees    Medications:  Prescriptions prior to admission  Medication Sig  Dispense Refill  . acetaminophen (TYLENOL) 500 MG tablet Take 500 mg by mouth every 6 (six) hours as needed for pain.      Marland Kitchen amLODipine (NORVASC) 10 MG tablet Take 1 tablet (10 mg total) by mouth daily.  30 tablet  0  . cyclobenzaprine (FLEXERIL) 10 MG tablet Take 10 mg by mouth daily as needed for muscle spasms.      Marland Kitchen oxyCODONE-acetaminophen (PERCOCET/ROXICET) 5-325 MG per tablet Take 1 tablet by mouth every 8 (eight) hours as needed for pain.  20 tablet  0  . ramipril (ALTACE) 2.5 MG capsule Take 1 capsule (2.5 mg total) by mouth daily.  30 capsule  0  . sodium chloride 0.9 % SOLN 250 mL with vancomycin 10 G SOLR Inject 1,750 mg into the vein every 12 (twelve) hours. Last dose was Saturday night 7:00pm 05-13-13       Assessment: Seth Martinez is a 62 y.o. male  has a past medical history of Hyperlipidemia; Chronic back pain; Metabolic syndrome; Depression; Insomnia; Fatigue; Vertigo; Joint pain; Obesity; Hypertension; Septic arthritis (October/2014); and Meniscus tear.   Goal of Therapy:  Weight and renal adjusted daptomycin   Plan:  1000mg  ~7mg /kg q24h with cx and clinical course follow-up  Seth Martinez, Seth Martinez 05/16/2013,12:54 AM

## 2013-05-17 DIAGNOSIS — M009 Pyogenic arthritis, unspecified: Secondary | ICD-10-CM

## 2013-05-17 LAB — BASIC METABOLIC PANEL
Calcium: 8.9 mg/dL (ref 8.4–10.5)
Creatinine, Ser: 1.11 mg/dL (ref 0.50–1.35)
GFR calc Af Amer: 79 mL/min — ABNORMAL LOW (ref >90)

## 2013-05-17 MED ORDER — SODIUM CHLORIDE 0.9 % IV SOLN: 1000.0000 mg | INTRAVENOUS | Status: DC

## 2013-05-17 MED ORDER — DAPTOMYCIN 500 MG IV SOLR: 1000.0000 mg | INTRAVENOUS | Status: DC

## 2013-05-17 MED ORDER — HEPARIN SOD (PORK) LOCK FLUSH 100 UNIT/ML IV SOLN
250.0000 [IU] | INTRAVENOUS | Status: AC | PRN
  Administered 2013-05-17: 250 [IU]

## 2013-05-17 NOTE — Progress Notes (Signed)
NURSING PROGRESS NOTE  Seth Martinez [id number] Discharge Data: 05/17/2013 12:59 PM Attending Provider: Eddie North, MD GEX:[id number], Dorinda Hill, MD     [name] to be D/C'd Home per MD order.  Discussed with the patient the After Visit Summary and all questions fully answered.  All belongings returned to patient for patient to take home.   Last Vital Signs:  Blood pressure 150/80, pulse 78, temperature 99.4 F (37.4 C), temperature source Oral, resp. rate 20, height 5' 10.8" (1.798 m), weight 133.267 kg (293 lb 12.8 oz), SpO2 98.00%.  Discharge Medication List   Medication List    STOP taking these medications       sodium chloride 0.9 % SOLN 250 mL with vancomycin 10 G SOLR      TAKE these medications       acetaminophen 500 MG tablet  Commonly known as:  TYLENOL  Take 500 mg by mouth every 6 (six) hours as needed for pain.     amLODipine 10 MG tablet  Commonly known as:  NORVASC  Take 1 tablet (10 mg total) by mouth daily.     cyclobenzaprine 10 MG tablet  Commonly known as:  FLEXERIL  Take 10 mg by mouth daily as needed for muscle spasms.     oxyCODONE-acetaminophen 5-325 MG per tablet  Commonly known as:  PERCOCET/ROXICET  Take 1 tablet by mouth every 8 (eight) hours as needed for pain.     ramipril 2.5 MG capsule  Commonly known as:  ALTACE  Take 1 capsule (2.5 mg total) by mouth daily.     sodium chloride 0.9 % SOLN 100 mL with DAPTOmycin 500 MG SOLR 1,000 mg  Inject 1,000 mg into the vein daily.

## 2013-05-17 NOTE — Progress Notes (Addendum)
[facility]  Date of Admission:  05/15/2013  Antibiotics: Antibiotics Given (last 72 hours)   Date/Time Action Medication Dose Rate   05/16/13 0315 Given   DAPTOmycin (CUBICIN) 1,000 mg in sodium chloride 0.9 % IVPB 1,000 mg 240 mL/hr   05/17/13 0112 Given   DAPTOmycin (CUBICIN) 1,000 mg in sodium chloride 0.9 % IVPB 1,000 mg 240 mL/hr      Subjective: No complaints, rash improving  Objective: Temp:  [98.1 F (36.7 C)-99.4 F (37.4 C)] 99.4 F (37.4 C) (10/29 1013) Pulse Rate:  [71-86] 78 (10/29 1013) Resp:  [18-20] 20 (10/29 1013) BP: (108-150)/(68-85) 150/80 mmHg (10/29 1013) SpO2:  [94 %-100 %] 98 % (10/29 1013)  General: awake, alert Skin: diffuse petechial macular rash, non confluent, improved Lungs: CTA B Cor: rrr Abdomen: soft, nt, nd Ext: no edema  Lab Results Lab Results  Component Value Date   WBC 8.4 05/16/2013   HGB 12.3* 05/16/2013   HCT 36.8* 05/16/2013   MCV 91.1 05/16/2013   PLT 404* 05/16/2013    Lab Results  Component Value Date   CREATININE 1.11 05/17/2013   BUN 15 05/17/2013   NA 134* 05/17/2013   K 3.9 05/17/2013   CL 97 05/17/2013   CO2 28 05/17/2013    Lab Results  Component Value Date   ALT 45 05/16/2013   AST 61* 05/16/2013   ALKPHOS 116 05/16/2013   BILITOT 0.6 05/16/2013      Microbiology: Recent Results (from the past 240 hour(s))  CULTURE, BLOOD (ROUTINE X 2)     Status: None   Collection Time    05/16/13  2:00 AM      Result Value Range Status   Specimen Description BLOOD RIGHT HAND   Final   Special Requests BOTTLES DRAWN AEROBIC ONLY 3CC   Final   Culture  Setup Time     Final   Value: 05/16/2013 10:08     Performed at [facility]   Culture     Final   Value:        BLOOD CULTURE RECEIVED NO GROWTH TO DATE CULTURE WILL BE HELD FOR 5 DAYS BEFORE ISSUING A FINAL NEGATIVE REPORT     Performed at [facility]   Report Status PENDING   Incomplete  CULTURE, BLOOD  (ROUTINE X 2)     Status: None   Collection Time    05/16/13  2:40 AM      Result Value Range Status   Specimen Description BLOOD RIGHT HAND   Final   Special Requests BOTTLES DRAWN AEROBIC ONLY 5CC   Final   Culture  Setup Time     Final   Value: 05/16/2013 10:08     Performed at [facility]   Culture     Final   Value:        BLOOD CULTURE RECEIVED NO GROWTH TO DATE CULTURE WILL BE HELD FOR 5 DAYS BEFORE ISSUING A FINAL NEGATIVE REPORT     Performed at [facility]   Report Status PENDING   Incomplete    Studies/Results: No results found.  Assessment/Plan: 1) Rash - vasculitic likely vancomycin induced.  Now listed as allergy.  2) MRSA - continue with daptomycin at current dose through at least Nov 11th (4 weeks)  We will arrange follow up around the 11th  Antibiotics per home health protocol  COMER, Molly Maduro, MD [facility] [email or web address] [phone]  pager   [phone] cell 05/17/2013, 10:22 AM

## 2013-05-17 NOTE — Progress Notes (Signed)
Patient ID: Seth Martinez, male   DOB: 1951/04/21, 62 y.o.   MRN: [id number]  Plan: Continue vancomycin  Assessment: Vasculitic generalized rash from use of vancomycin  Subjectively: He says he doing better compared to yesterday. However, he still experiences tightness in his hands and joints. He says that the rash has slightly decreased in size.  Objectively: Last recorded: 10/29 0602   BP: 142/68 Pulse: 84  Temp: 99.1 F (37.3 C) Resp: 18  SpO2: 97     CVS: S1+S2+0 Resp: NVB B/L, no added sounds Abdomen: soft, nt, gs +ve Other exams normal Rash: erythematous, non-blanchable, generalized  Culture, blood (routine x 2) [[id number]] Collected: 05/16/13 0240 Order Status: Completed Updated: 05/17/13 0813 Specimen Information: Blood / Peripheral Specimen Description BLOOD RIGHT HAND Special Requests BOTTLES DRAWN AEROBIC ONLY 5CC Culture Setup Time - Result: 05/16/2013 10:08 Performed at [facility] Culture - Result: BLOOD CULTURE RECEIVED NO GROWTH TO DATE CULTURE WILL BE HELD FOR 5 DAYS BEFORE ISSUING A FINAL NEGATIVE REPORT Performed at [facility] Report Status PENDING

## 2013-05-17 NOTE — Discharge Summary (Addendum)
Physician Discharge Summary  Seth Martinez UJW:[id number] DOB: 1950/11/13 DOA: 05/15/2013  PCP: Rudi Heap, MD  Admit date: 05/15/2013 Discharge date: 05/17/2013  Time spent: 40 minutes  Recommendations for Outpatient Follow-up:  Follow up with PCP in 1 week. If rash unimproved or worsened please call your PCP or Dr Luciana Axe.  Discharge Diagnoses:  Principal Problem:   Rash and nonspecific skin eruption  Active Problems:   Hypertension   Septic arthritis of multiple joints   Staphylococcus aureus septicemia   Discharge Condition: fair  Diet recommendation: low sodium  Filed Weights   05/15/13 2345 05/16/13 0051  Weight: 148 kg (326 lb 4.5 oz) 133.267 kg (293 lb 12.8 oz)    History of present illness:  Please refer to admission H&P for details, but in brief, 62 y/o male with septic arthritis of left knee with staph aureus bacteremia who was discharged about 2 weeks back on 4 weeks course of IV vancomycin, developed developed diffuse morbilliform non-pruritic nonblanching skin rash which was predominantly in his extremities. He was switched to daptomycin by ID and then called to the hospital for monitoring. Patient did not have any fever or chills, SOB, N/ V or joint pains.  Hospital Course:  generalized eruptive rash Patient developed morbiliform non pruritic rash which is likely due to vancomycin. This was switched to IV daptomycin on 10/27. His rash has now markedly improved and feels better. appreciate ID consult . Plan on IV daptomycin daily until nov 11th. [facility]. Patient stable for discharge and will follow up with PCP and ID in 2 weeks. Please check cbc upon follow up.  Septic let knee arthritis abx until 11/11. Follow up with ID and ortho as outpt   Hypertension.  Recently started on norvasc. incidence of rash with norvasc is <2% and doubt this is causing it. contineu ramipril.  Hyponatremia  improved with  fluids    Procedures: none Consultations:  ID  Discharge Exam: Filed Vitals:   05/17/13 1013  BP: 150/80  Pulse: 78  Temp: 99.4 F (37.4 C)  Resp: 20    General: Elderly male in NAD HEENT: no pallor, moist oral mucosa  CHEST: Clear  B/L  CVS: NS1&S2, no murmurs Abd: soft, few macular rash over abdominal wall, NT, ND, BS+ Ext: diffuse macular morbiliform rash, no warmth or tenderness. Left UE PICC CNS: AAOX3  Discharge Instructions   Future Appointments Provider Department Dept Phone   05/18/2013 10:30 AM Doree Albee, MD [facility] [phone]   05/22/2013 2:00 PM Cliffton Asters, MD [facility] [phone]   06/28/2013 8:00 AM Ernestina Penna, MD [facility] [phone]       Medication List    STOP taking these medications       sodium chloride 0.9 % SOLN 250 mL with vancomycin 10 G SOLR      TAKE these medications       acetaminophen 500 MG tablet  Commonly known as:  TYLENOL  Take 500 mg by mouth every 6 (six) hours as needed for pain.     amLODipine 10 MG tablet  Commonly known as:  NORVASC  Take 1 tablet (10 mg total) by mouth daily.     cyclobenzaprine 10 MG tablet  Commonly known as:  FLEXERIL  Take 10 mg by mouth daily as needed for muscle spasms.     oxyCODONE-acetaminophen 5-325 MG per tablet  Commonly known as:  PERCOCET/ROXICET  Take 1 tablet  by mouth every 8 (eight) hours as needed for pain.     ramipril 2.5 MG capsule  Commonly known as:  ALTACE  Take 1 capsule (2.5 mg total) by mouth daily.     sodium chloride 0.9 % SOLN 100 mL with DAPTOmycin 500 MG SOLR 1,000 mg  Inject 1,000 mg into the vein daily. UNTIL 05/30/2013       Allergies  Allergen Reactions  . Morphine And Related Anaphylaxis  . Penicillins Anaphylaxis  . Vancomycin Rash    Vasculitis  . Amitriptyline     unk  . Antara [Fenofibrate Micronized]     unk  . Bextra [Valdecoxib]      unk  . Cefdinir     unk  . Celebrex [Celecoxib]     unk  . Crestor [Rosuvastatin Calcium]     unk  . Escitalopram Oxalate     unk  . Fish Oil     unk  . Ibuprofen     unk  . Naproxen Sodium     unk  . Neurontin [Gabapentin]     unk  . Propoxyphene And Methadone     unk  . Skelaxin     unk  . Ultram [Tramadol]     unk  . Vioxx [Rofecoxib]     unk  . Zocor [Simvastatin]     unk       Follow-up Information   Follow up with Rudi Heap, MD In 1 week.   Specialty:  Family Medicine   Contact information:   [address] [place] [address] [phone]       Follow up with Staci Righter, MD In 1 week.   Specialty:  Infectious Diseases   Contact information:   [address] [place] [address] [phone]        The results of significant diagnostics from this hospitalization (including imaging, microbiology, ancillary and laboratory) are listed below for reference.    Significant Diagnostic Studies: Dg Chest 2 View  05/07/2013   CLINICAL DATA:  Fever with shortness of breath.  EXAM: CHEST  2 VIEW  COMPARISON:  08/24/2012.  FINDINGS: There are lower lung volumes with new linear right upper lobe atelectasis, fissural thickening and central airway thickening. There is no overt pulmonary edema, confluent airspace opacity or pleural effusion. The pulmonary vascularity appears normal. The heart size and mediastinal contours are stable.  IMPRESSION: Increased central airway thickening and interstitial prominence suspicious for bronchitis. No evidence of pneumonia.   Electronically Signed   By: Roxy Horseman M.D.   On: 05/07/2013 16:29   Dg Chest 2 View  04/30/2013   CLINICAL DATA:  Back pain and cough.  EXAM: CHEST  2 VIEW  COMPARISON:  No priors.  FINDINGS: Lung volumes are normal. No consolidative airspace disease. No pleural effusions. No pneumothorax. No pulmonary nodule or mass noted. Pulmonary vasculature and the cardiomediastinal silhouette  are within normal limits. Old healed fracture of the distal right clavicle incidentally noted.  IMPRESSION: 1.  No radiographic evidence of acute cardiopulmonary disease.   Electronically Signed   By: Trudie Reed M.D.   On: 04/30/2013 15:22   Dg Lumbar Spine 2-3 Views  04/30/2013   CLINICAL DATA:  Back pain.  EXAM: LUMBAR SPINE - 2-3 VIEW  COMPARISON:  None.  FINDINGS: There is marked convex right scoliosis. Multilevel degenerative change is present. The patient is status post L4-S1 fusion. Hardware appears intact. No fracture is identified.  IMPRESSION: No acute finding.  Multilevel degenerative change. Status post L4-S1 fusion.   Electronically Signed   By: Drusilla Kanner M.D.   On: 04/30/2013 16:56   Dg Hip Complete Right  04/30/2013   CLINICAL DATA:  Low back pain and right-sided hip pain.  EXAM: RIGHT HIP - COMPLETE 2+ VIEW  COMPARISON:  No priors.  FINDINGS: AP view of the pelvis and AP and lateral views of the right hip demonstrate no definite acute displaced fracture, subluxation, dislocation, joint or soft tissue abnormality. Orthopedic fixation hardware is noted at the lumbosacral junction, and the fixation screw in the low was position on the left appears fractured. No prior studies are available for comparison.  IMPRESSION: 1. No acute radiographic abnormality of the bony pelvis or the left hip. 2. Fracture of the inferior fixation screw on the left side in this patient status post PLIF at L4-S1. Whether or not this is an acute finding or has been present on prior outside studies is uncertain. Correlation with prior outside examinations is suggested.   Electronically Signed   By: Trudie Reed M.D.   On: 04/30/2013 16:50   Dg Knee 2 Views Left  05/02/2013   CLINICAL DATA:  Joint pain.  EXAM: LEFT KNEE - 1-2 VIEW  COMPARISON:  None.  FINDINGS: There is subcutaneous emphysema in a line along the medial knee, presumably from reported arthrocentesis. Small to moderate knee joint  effusion, suprapatellar. No osteolysis. Mild tricompartmental osteoarthritis, without focal or advanced joint narrowing. No fracture, malalignment, or other focal osseous abnormality.  IMPRESSION: 1. Mild to moderate volume knee joint effusion. 2. Mild tricompartmental osteoarthritis. 3. Subcutaneous gas, presumably from reported arthrocentesis.   Electronically Signed   By: Tiburcio Pea M.D.   On: 05/02/2013 00:08   Ct Head Wo Contrast  05/03/2013   CLINICAL DATA:  Losing vision in left eye.  EXAM: CT HEAD WITHOUT CONTRAST  TECHNIQUE: Contiguous axial images were obtained from the base of the skull through the vertex without intravenous contrast.  COMPARISON:  None.  FINDINGS: Skull:No significant abnormality. Mild thickening of the dorsum sella.  Orbits: No evidence of mass. Symmetric appearing globes.  Brain: No evidence of acute abnormality, such as acute infarction, hemorrhage, hydrocephalus, or mass lesion/mass effect. No evidence of pituitary mass, meningioma, or other causes of left eye vision loss.  IMPRESSION: No acute intracranial findings.  No cause for left eye vision loss.   Electronically Signed   By: Tiburcio Pea M.D.   On: 05/03/2013 02:26   Ct Lumbar Spine W Contrast  05/05/2013   CLINICAL DATA:  Back pain with staph bacteremia. Rule out discitis/ osteomyelitis  EXAM: CT LUMBAR SPINE WITH CONTRAST  TECHNIQUE: Multidetector CT imaging of the lumbar spine was performed with intravenous contrast administration. Multiplanar CT image reconstructions were also generated.  CONTRAST:  OMNIPAQUE IOHEXOL 300 MG/ML  SOLN  COMPARISON:  None.  FINDINGS: Image quality degraded by obesity.  Negative for fracture or mass. No CT findings of discitis or osteomyelitis. No endplate erosion. No epidural or paraspinous abscess. Psoas muscles are symmetric and appear normal bilaterally. Left renal cyst.  T12-L1:  Mild spondylosis and mild facet degeneration  L1-2: Moderate spondylosis and facet  degeneration with mild spinal stenosis. There is left foraminal encroachment due to spurring  L2-3: Moderate spondylosis. Left lateral recess and left foraminal encroachment. Moderate facet hypertrophy bilaterally. Moderate spinal stenosis.  L3-4: Mild disk degeneration with moderate facet degeneration. Mild spinal stenosis.  L4-5: Pedicle screw fusion. Hardware appears to be in  satisfactory position. There is extensive streak artifact related to hardware obscuring the canal  L5-S1: Bilateral pedicle screw fusion with extensive artifact obscuring the disc space.  IMPRESSION: Suboptimal image quality. The patient is obese. There is extensive streak artifact related to pedicle screw fusion bilaterally at L4-5 and L5-S1.  Spondylosis with mild spinal stenosis at L1-2. Moderate spinal stenosis at L2-3.  No evidence of fracture or spinal infection.   Electronically Signed   By: Marlan Palau M.D.   On: 05/05/2013 13:51   Ct Shoulder Right W Contrast  05/05/2013   CLINICAL DATA:  Right shoulder pain in a patient with staph bacteremia.  EXAM: CT OF THE RIGHT SHOULDER WITH CONTRAST  TECHNIQUE: Multidetector CT imaging was performed following the standard protocol during bolus administration of intravenous contrast.  CONTRAST:  OMNIPAQUE IOHEXOL 300 MG/ML  SOLN  COMPARISON:  None.  FINDINGS: There is a small amount of fluid is subscapularis recess. No fluid within the glenohumeral joint is identified. No soft tissue gas collection is seen. There is no bony destructive change. Acromioclavicular osteoarthritis is noted. No focal bone lesion is identified. Imaged lung parenchyma demonstrates scattered atelectasis. As visualized CT scan, the rotator cuff appears intact.  IMPRESSION: No CT evidence of septic joint or osteomyelitis.  Acromioclavicular osteoarthritis.   Electronically Signed   By: Drusilla Kanner M.D.   On: 05/05/2013 11:27   Dg Hand 2 View Right  05/02/2013   CLINICAL DATA:  Pain, no injury  EXAM:  RIGHT HAND - 2 VIEW  COMPARISON:  None.  FINDINGS: There is no evidence of fracture or dislocation. There is no evidence of arthropathy or other focal bone abnormality. Mild soft tissue swelling is present  IMPRESSION: No acute osseous abnormality.   Electronically Signed   By: Davonna Belling M.D.   On: 05/02/2013 00:09   [place] Extrem Up Right Ltd  05/08/2013   CLINICAL DATA:  Soft tissue abscess. Hand pain. Index finger pain over the flexor tendons.  EXAM: RIGHT UPPER EXTREMITY SOFT TISSUE ULTRASOUND LIMITED  TECHNIQUE: Ultrasound examination was performed including evaluation of the muscles, tendons, joint, and adjacent soft tissues.  COMPARISON:  None.  FINDINGS: Index finger flexor tenosynovitis is present. There is fluid within the flexor tendon sheath and mild heterogeneous echotexture of the tendon itself. On real-time color Doppler interrogation, the periphery of the flexor tendon sheath is hypervascular.  There is no subcutaneous fluid collection or abscess.  IMPRESSION: Right index finger flexor tenosynovitis. This is nonspecific and can be inflammatory or infectious.   Electronically Signed   By: Andreas Newport M.D.   On: 05/08/2013 15:46   Dg Hand Complete Right  05/08/2013   CLINICAL DATA:  Pain and swelling of the index finger. Flexor tenosynovitis on comparison ultrasound  EXAM: RIGHT HAND - COMPLETE 3+ VIEW  COMPARISON:  Ultrasound 05/08/2013  FINDINGS: There is soft tissue swelling of the 1st digit. No evidence subcutaneous gas. No osseous abnormality.  IMPRESSION: Soft tissue swelling without osseous abnormality.   Electronically Signed   By: Genevive Bi M.D.   On: 05/08/2013 16:30    Microbiology: Recent Results (from the past 240 hour(s))  CULTURE, BLOOD (ROUTINE X 2)     Status: None   Collection Time    05/16/13  2:00 AM      Result Value Range Status   Specimen Description BLOOD RIGHT HAND   Final   Special Requests BOTTLES DRAWN AEROBIC ONLY 3CC   Final   Culture  Setup  Time     Final   Value: 05/16/2013 10:08     Performed at [facility]   Culture     Final   Value:        BLOOD CULTURE RECEIVED NO GROWTH TO DATE CULTURE WILL BE HELD FOR 5 DAYS BEFORE ISSUING A FINAL NEGATIVE REPORT     Performed at [facility]   Report Status PENDING   Incomplete  CULTURE, BLOOD (ROUTINE X 2)     Status: None   Collection Time    05/16/13  2:40 AM      Result Value Range Status   Specimen Description BLOOD RIGHT HAND   Final   Special Requests BOTTLES DRAWN AEROBIC ONLY 5CC   Final   Culture  Setup Time     Final   Value: 05/16/2013 10:08     Performed at [facility]   Culture     Final   Value:        BLOOD CULTURE RECEIVED NO GROWTH TO DATE CULTURE WILL BE HELD FOR 5 DAYS BEFORE ISSUING A FINAL NEGATIVE REPORT     Performed at [facility]   Report Status PENDING   Incomplete     Labs: Basic Metabolic Panel:  Recent Labs Lab 05/15/13 2020 05/16/13 0200 05/17/13 0510  NA 128* 129* 134*  K 4.5 4.5 3.9  CL 92* 92* 97  CO2 23 23 28   GLUCOSE 142* 117* 117*  BUN 17 18 15   CREATININE 1.24 1.17 1.11  CALCIUM 9.3 8.8 8.9  MG  --  2.1  --   PHOS  --  5.4*  --    Liver Function Tests:  Recent Labs Lab 05/15/13 2020 05/16/13 0200  AST 47* 61*  ALT 49 45  ALKPHOS 131* 116  BILITOT 0.7 0.6  PROT 8.1 7.2  ALBUMIN 2.6* 2.3*   No results found for this basename: LIPASE, AMYLASE,  in the last 168 hours No results found for this basename: AMMONIA,  in the last 168 hours CBC:  Recent Labs Lab 05/15/13 2020 05/16/13 0200  WBC 10.7* 8.4  NEUTROABS 8.2*  --   HGB 13.5 12.3*  HCT 39.9 36.8*  MCV 91.5 91.1  PLT 458* 404*   Cardiac Enzymes:  Recent Labs Lab 05/16/13 0200  CKTOTAL 71  68   BNP: BNP (last 3 results) No results found for this basename: PROBNP,  in the last 8760 hours CBG:  Recent Labs Lab 05/16/13 0239  GLUCAP 329*       Signed:  Jovanni Rash  [facility] 05/17/2013,  10:47 AM

## 2013-05-18 ENCOUNTER — Ambulatory Visit: Payer: Medicare Other | Admitting: Family Medicine

## 2013-05-22 ENCOUNTER — Ambulatory Visit (INDEPENDENT_AMBULATORY_CARE_PROVIDER_SITE_OTHER): Payer: Medicare Other | Admitting: Internal Medicine

## 2013-05-22 VITALS — BP 128/84 | HR 99 | Temp 98.5°F | Ht 70.0 in | Wt 295.8 lb

## 2013-05-22 DIAGNOSIS — M009 Pyogenic arthritis, unspecified: Secondary | ICD-10-CM

## 2013-05-22 LAB — CULTURE, BLOOD (ROUTINE X 2)
Culture: NO GROWTH
Culture: NO GROWTH

## 2013-05-22 NOTE — Progress Notes (Signed)
Patient ID: Seth Martinez, male   DOB: March 21, 1951, 62 y.o.   MRN: [id number]         [facility]  Patient Active Problem List   Diagnosis Date Noted  . Rash and nonspecific skin eruption 05/15/2013  . Morbid obesity 05/04/2013  . Staphylococcus aureus septicemia 05/04/2013  . Septic arthritis of multiple joints 05/02/2013  . Hyperlipemia 12/28/2012  . Hypertension 12/28/2012  . Vitamin D deficiency 12/28/2012    Patient's Medications  New Prescriptions   No medications on file  Previous Medications   ACETAMINOPHEN (TYLENOL) 500 MG TABLET    Take 500 mg by mouth every 6 (six) hours as needed for pain.   AMLODIPINE (NORVASC) 10 MG TABLET    Take 1 tablet (10 mg total) by mouth daily.   CYCLOBENZAPRINE (FLEXERIL) 10 MG TABLET    Take 10 mg by mouth daily as needed for muscle spasms.   OXYCODONE-ACETAMINOPHEN (PERCOCET/ROXICET) 5-325 MG PER TABLET    Take 1 tablet by mouth every 8 (eight) hours as needed for pain.   RAMIPRIL (ALTACE) 2.5 MG CAPSULE    Take 1 capsule (2.5 mg total) by mouth daily.   SODIUM CHLORIDE 0.9 % SOLN 100 ML WITH DAPTOMYCIN 500 MG SOLR 1,000 MG    Inject 1,000 mg into the vein daily.  Modified Medications   No medications on file  Discontinued Medications   No medications on file    Subjective: [name] is in for his hospital followup visit. Has had 2 recent hospitalizations. The initial one was for MRSA bacteremia complicated by septic arthritis of his left knee and probable infection of his right index finger MCP joint. He also had some right shoulder pain and low back pain but could not fit in the MRI scanner. Transesophageal echocardiogram did not reveal any evidence of endocarditis. He did have some floaters and blurring of his left eye vision and was noted to have some early detritus that was improving with antibiotic therapy. He had arthroscopic washout of his left knee it was treated with IV vancomycin. 2 weeks into  therapy he developed a diffuse rash compatible with leukocytoclastic vasculitis. His vancomycin was changed to daptomycin and his rash is improving. Overall his pain is improving. He is now completed 20 days of total antibiotic therapy. He is more active but is still requiring about 3 Percocet daily. Review of Systems: Constitutional: positive for malaise, negative for anorexia, chills, fevers and sweats Eyes: improving left eye vision Ears, nose, mouth, throat, and face: negative Respiratory: negative Cardiovascular: negative Gastrointestinal: negative Genitourinary:negative  Past Medical History  Diagnosis Date  . Hyperlipidemia   . Chronic back pain   . Metabolic syndrome   . Depression   . Insomnia   . Fatigue   . Vertigo   . Joint pain   . Obesity   . Hypertension   . Septic arthritis October/2014  . Meniscus tear     bilateral knees    History  Substance Use Topics  . Smoking status: Never Smoker   . Smokeless tobacco: Not on file  . Alcohol Use: No     Comment: rare    Family History  Problem Relation Age of Onset  . Stroke Father   . Diabetes type II Brother     Allergies  Allergen Reactions  . Morphine And Related Anaphylaxis  . Penicillins Anaphylaxis  . Vancomycin Rash    Vasculitis  . Amitriptyline     unk  .  Antara [Fenofibrate Micronized]     unk  . Bextra [Valdecoxib]     unk  . Cefdinir     unk  . Celebrex [Celecoxib]     unk  . Crestor [Rosuvastatin Calcium]     unk  . Escitalopram Oxalate     unk  . Fish Oil     unk  . Ibuprofen     unk  . Naproxen Sodium     unk  . Neurontin [Gabapentin]     unk  . Propoxyphene And Methadone     unk  . Skelaxin     unk  . Ultram [Tramadol]     unk  . Vioxx [Rofecoxib]     unk  . Zocor [Simvastatin]     unk    Objective: Temp: 98.5 F (36.9 C) (11/03 1416) Temp src: Oral (11/03 1416) BP: 128/84 mmHg (11/03 1416) Pulse Rate: 99 (11/03 1416)  General: He is in no distress Skin:  He reports improvement in his diffuse petechial rash. Left upper arm PICC site appears normal Lungs: Clear Cor: Regular S1 and S2 no murmurs Abdomen: Obese, soft and nontender Joints extremities: His left knee incisions are healing well. He has minimal swelling but no redness or warmth. He has diffuse swelling and some persistent redness over his right index finger with some desquamation. He has no warmth or swelling of any other joints.  Lab Results  Component Value Date   CRP 36.8* 05/01/2013   Lab Results  Component Value Date   ESRSEDRATE 67* 05/01/2013   Blood culture 05/01/2013: MRSA; 05/03/2013 and 05/16/2013: Negative Left knee synovial fluid 05/01/2013: MRSA   Assessment: He is improving slowly on therapy for disseminated MRSA infection. He probably had leukocytoclastic vasculitis related to his vancomycin and that is resolving slowly after a switch to daptomycin. I'm concerned that he may have had more joints involved with early infection and could also have some vertebral infection as he is still having some pain in his back. He will probably need closer to 6 weeks of total therapy.  Plan: 1. Continue daptomycin 2. Repeat sedimentation rate and C-reactive protein 3. Followup in one week      Cliffton Asters, MD [facility] [phone] pager   [phone] cell 05/22/2013, 2:39 PM

## 2013-05-23 ENCOUNTER — Ambulatory Visit (INDEPENDENT_AMBULATORY_CARE_PROVIDER_SITE_OTHER): Payer: Medicare Other | Admitting: Family Medicine

## 2013-05-23 ENCOUNTER — Encounter: Payer: Self-pay | Admitting: Family Medicine

## 2013-05-23 VITALS — BP 129/83 | HR 98 | Temp 97.6°F | Ht 70.0 in | Wt 295.0 lb

## 2013-05-23 DIAGNOSIS — I10 Essential (primary) hypertension: Secondary | ICD-10-CM

## 2013-05-23 DIAGNOSIS — M009 Pyogenic arthritis, unspecified: Secondary | ICD-10-CM

## 2013-05-23 DIAGNOSIS — A4101 Sepsis due to Methicillin susceptible Staphylococcus aureus: Secondary | ICD-10-CM

## 2013-05-23 DIAGNOSIS — E559 Vitamin D deficiency, unspecified: Secondary | ICD-10-CM

## 2013-05-23 DIAGNOSIS — E785 Hyperlipidemia, unspecified: Secondary | ICD-10-CM

## 2013-05-23 LAB — C-REACTIVE PROTEIN: CRP: 5.6 mg/dL — ABNORMAL HIGH (ref <0.60)

## 2013-05-23 MED ORDER — RAMIPRIL 2.5 MG PO CAPS: 2.5000 mg | ORAL_CAPSULE | Freq: Every day | ORAL | Status: DC

## 2013-05-23 MED ORDER — OXYCODONE-ACETAMINOPHEN 5-325 MG PO TABS: 1.0000 | ORAL_TABLET | Freq: Three times a day (TID) | ORAL | Status: DC | PRN

## 2013-05-23 NOTE — Progress Notes (Signed)
Subjective:    Patient ID: Seth Martinez, male    DOB: November 08, 1950, 62 y.o.   MRN: [id number]  HPI Patient here today for hospital follow up from [facility] for recent hospitalization for septic arthritis and torn meniscus surgery on left knee. Patient accompanied today by his son. Before his recent hospitalizations he had a history of arthritis, hyperlipidemia, hypertension, vitamin D deficiency, and depression    Review of Systems  Constitutional: Negative.   HENT: Negative.   Eyes: Negative.   Respiratory: Negative.   Cardiovascular: Negative.   Gastrointestinal: Negative.   Endocrine: Negative.   Genitourinary: Negative.   Musculoskeletal: Positive for arthralgias (joint pain/soreness) and joint swelling.  Skin: Positive for rash (red man's syndrome).  Allergic/Immunologic: Negative.   Neurological: Negative.   Hematological: Negative.   Psychiatric/Behavioral: Negative.        Objective:   Physical Exam  Nursing note and vitals reviewed. Constitutional: He is oriented to person, place, and time. He appears well-developed and well-nourished. No distress.  Somewhat pale  HENT:  Head: Normocephalic and atraumatic.  Eyes: Conjunctivae are normal. Pupils are equal, round, and reactive to light. Right eye exhibits no discharge. Left eye exhibits no discharge. No scleral icterus.  Neck: Normal range of motion. Neck supple. No thyromegaly present.  Cardiovascular: Normal rate, regular rhythm and normal heart sounds.   No murmur heard. At 72 per minute  Pulmonary/Chest: Effort normal and breath sounds normal. No respiratory distress. He has no wheezes. He has no rales.  Abdominal: He exhibits no distension.  Obesity  Musculoskeletal: He exhibits tenderness (right index finger and right shoulder tender). He exhibits no edema.  Hesitant range of motion  Lymphadenopathy:    He has no cervical adenopathy.  Neurological: He is alert and oriented to person, place, and time.   Skin: Skin is warm and dry. Rash noted. He is not diaphoretic.  Patient still has a purpuric rash lower extremities and abdomen and arms,  Psychiatric: He has a normal mood and affect. His behavior is normal. Judgment and thought content normal.   BP 129/83  Pulse 98  Temp(Src) 97.6 F (36.4 C) (Oral)  Ht 5\' 10"  (1.778 m)  Wt 295 lb (133.811 kg)  BMI 42.33 kg/m2        Assessment & Plan:   1. Septic arthritis of multiple joints   2. Hyperlipemia   3. Hypertension   4. Staphylococcus aureus septicemia   5. Vitamin D deficiency    No orders of the defined types were placed in this encounter.   Meds ordered this encounter  Medications  . CUBICIN 500 MG injection    Sig:   . DISCONTD: sodium chloride 0.9 % infusion    Sig:   . diphenhydrAMINE (BENADRYL) 25 MG tablet    Sig: Take 25 mg by mouth daily.  Marland Kitchen oxyCODONE-acetaminophen (PERCOCET/ROXICET) 5-325 MG per tablet    Sig: Take 1 tablet by mouth 3 (three) times daily as needed.    Dispense:  90 tablet    Refill:  0  . ramipril (ALTACE) 2.5 MG capsule    Sig: Take 1 capsule (2.5 mg total) by mouth daily.    Dispense:  30 capsule    Refill:  12   Patient Instructions  Continue to followup with infectious disease as planned Please be careful and do not put yourself at risk for falling Try to drink plenty of fluids We will recheck you again as planned in early December and sooner if needed  A handicap parking permit was signed and given to you today also   Nyra Capes MD

## 2013-05-23 NOTE — Patient Instructions (Addendum)
Continue to followup with infectious disease as planned Please be careful and do not put yourself at risk for falling Try to drink plenty of fluids We will recheck you again as planned in early December and sooner if needed A handicap parking permit was signed and given to you today also

## 2013-05-30 ENCOUNTER — Encounter: Payer: Self-pay | Admitting: Internal Medicine

## 2013-05-30 ENCOUNTER — Ambulatory Visit (INDEPENDENT_AMBULATORY_CARE_PROVIDER_SITE_OTHER): Payer: Medicare Other | Admitting: Internal Medicine

## 2013-05-30 VITALS — BP 133/83 | HR 103 | Temp 98.2°F | Ht 70.0 in | Wt 295.0 lb

## 2013-05-30 DIAGNOSIS — M009 Pyogenic arthritis, unspecified: Secondary | ICD-10-CM

## 2013-05-30 DIAGNOSIS — A4101 Sepsis due to Methicillin susceptible Staphylococcus aureus: Secondary | ICD-10-CM

## 2013-05-30 NOTE — Progress Notes (Signed)
Patient ID: Seth Martinez, male   DOB: August 01, 1950, 62 y.o.   MRN: [id number]         [facility] for Infectious Disease  Patient Active Problem List   Diagnosis Date Noted  . Rash and nonspecific skin eruption 05/15/2013  . Morbid obesity 05/04/2013  . Staphylococcus aureus septicemia 05/04/2013  . Septic arthritis of multiple joints 05/02/2013  . Hyperlipemia 12/28/2012  . Hypertension 12/28/2012  . Vitamin D deficiency 12/28/2012    Patient's Medications  New Prescriptions   No medications on file  Previous Medications   DIPHENHYDRAMINE (BENADRYL) 25 MG TABLET    Take 25 mg by mouth daily.   OXYCODONE-ACETAMINOPHEN (PERCOCET/ROXICET) 5-325 MG PER TABLET    Take 1 tablet by mouth 3 (three) times daily as needed.   RAMIPRIL (ALTACE) 2.5 MG CAPSULE    Take 1 capsule (2.5 mg total) by mouth daily.   SODIUM CHLORIDE 0.9 % SOLN 100 ML WITH DAPTOMYCIN 500 MG SOLR 1,000 MG    Inject 1,000 mg into the vein daily.  Modified Medications   No medications on file  Discontinued Medications   CUBICIN 500 MG INJECTION        Subjective: Mr. States is in for his followup visit. He is feeling a little bit better but still has aches and pains and stiffness in all of his joints. He still requiring 3 Percocet daily. He has had no problems tolerating his PICC or daptomycin. His rash is resolving slowly. The vision in his left eye as improved significantly. He just saw Dr. Fawn Kirk today and was told he did not need to followup with him.  Review of Systems: Constitutional: positive for malaise, negative for anorexia, chills, fevers, sweats and weight loss Eyes: improving vision in left eye Ears, nose, mouth, throat, and face: negative Respiratory: negative Cardiovascular: negative Gastrointestinal: negative Genitourinary:negative  Past Medical History  Diagnosis Date  . Hyperlipidemia   . Chronic back pain   . Metabolic syndrome   . Depression   . Insomnia   . Fatigue     . Vertigo   . Joint pain   . Obesity   . Hypertension   . Septic arthritis October/2014  . Meniscus tear     bilateral knees    History  Substance Use Topics  . Smoking status: Never Smoker   . Smokeless tobacco: Never Used  . Alcohol Use: No     Comment: rare    Family History  Problem Relation Age of Onset  . Stroke Father   . Diabetes type II Brother     Allergies  Allergen Reactions  . Morphine And Related Anaphylaxis  . Penicillins Anaphylaxis  . Vancomycin Rash    Vasculitis  . Amitriptyline     unk  . Antara [Fenofibrate Micronized]     unk  . Bextra [Valdecoxib]     unk  . Cefdinir     unk  . Celebrex [Celecoxib]     unk  . Crestor [Rosuvastatin Calcium]     unk  . Escitalopram Oxalate     unk  . Fish Oil     unk  . Ibuprofen     unk  . Naproxen Sodium     unk  . Neurontin [Gabapentin]     unk  . Skelaxin     unk  . Ultram [Tramadol]     unk  . Vioxx [Rofecoxib]     unk  . Zocor [Simvastatin]     unk  Objective: Temp: 98.2 F (36.8 C) (11/11 1019) Temp src: Oral (11/11 1019) BP: 133/83 mmHg (11/11 1019) Pulse Rate: 103 (11/11 1019)  General: He is in with his daughter to date he is in good spirits. He is walking with his walker Skin: Fading generalized rash. Left arm PICC site appears normal with just slight redness around the insertion site Lungs: Clear Cor: Regular S1 and S2 with no murmurs Abdomen: Obese, soft and nontender Joints and extremities: Decreased swelling in left knee. Decreased swelling and redness around the right index finger MCP joint. Some difficulty with raising his right arm overhead but no redness or swelling of the shoulder    Lab Results  Component Value Date   CRP 5.6* 05/22/2013   Lab Results  Component Value Date   ESRSEDRATE 129* 05/22/2013   Assessment: Disseminated MRSA infection is improving slowly. He is now completed 28 days of therapy. He had left knee infection, right index finger  infection, left eye endophthalmitis and may have had seen of other joints including his right shoulder and back. I will plan on 2 more weeks of IV daptomycin.  He developed a vasculitic, allergic rash to vancomycin which is resolving slowly.  Plan: 1. Continue daptomycin for 2 more weeks and have PICC pulled 2. Followup on December 2   Cliffton Asters, MD [facility] for Infectious Disease [facility] [phone] pager   [phone] cell 05/30/2013, 10:51 AM

## 2013-06-06 ENCOUNTER — Other Ambulatory Visit: Payer: Self-pay | Admitting: Family Medicine

## 2013-06-06 LAB — CBC WITH DIFFERENTIAL/PLATELET
Basophils Relative: 1 % (ref 0–1)
Eosinophils Absolute: 0.2 10*3/uL (ref 0.0–0.7)
Eosinophils Relative: 3 % (ref 0–5)
Hemoglobin: 11.4 g/dL — ABNORMAL LOW (ref 13.0–17.0)
MCH: 29.2 pg (ref 26.0–34.0)
MCHC: 33.6 g/dL (ref 30.0–36.0)
MCV: 86.7 fL (ref 78.0–100.0)
Monocytes Relative: 11 % (ref 3–12)
Neutrophils Relative %: 72 % (ref 43–77)
Platelets: 382 10*3/uL (ref 150–400)
RDW: 13.8 % (ref 11.5–15.5)

## 2013-06-06 LAB — COMPREHENSIVE METABOLIC PANEL
ALT: 15 U/L (ref 0–53)
BUN: 10 mg/dL (ref 6–23)
CO2: 28 mEq/L (ref 19–32)
Calcium: 8.6 mg/dL (ref 8.4–10.5)
Creat: 0.97 mg/dL (ref 0.50–1.35)
Sodium: 136 mEq/L (ref 135–145)
Total Bilirubin: 0.5 mg/dL (ref 0.3–1.2)

## 2013-06-06 LAB — SEDIMENTATION RATE: Sed Rate: 116 mm/hr — ABNORMAL HIGH (ref 0–16)

## 2013-06-06 LAB — CREATININE, SERUM: Creat: 0.97 mg/dL (ref 0.50–1.35)

## 2013-06-06 LAB — C-REACTIVE PROTEIN: CRP: 9.5 mg/dL — ABNORMAL HIGH (ref <0.60)

## 2013-06-06 LAB — BUN: BUN: 10 mg/dL (ref 6–23)

## 2013-06-09 ENCOUNTER — Telehealth: Payer: Self-pay | Admitting: *Deleted

## 2013-06-09 NOTE — Telephone Encounter (Signed)
Patient called and advised that he is to stop his IV Dapto on 06/13/13 but his follow up visit is not until 06/20/13. He advised his finger is still swollen and he is having pain in his joints and is worried about being with out medication for a week. He reports no fever, chills or night sweats. Advised the patient will ask his doctor and call him back. He had labs on 06/06/13 Sed Rate was 116 and CRP was 9.5

## 2013-06-09 NOTE — Telephone Encounter (Signed)
Called patient to advise him that we are extending his IV antibiotics through his 06/20/13 visit. He was fine with that. Also called [facility] and gave them a verbal to extend the patient through 06/20/13 and they were fine with that also.

## 2013-06-09 NOTE — Telephone Encounter (Signed)
Please give an order to the home care agency to extend to his IV daptomycin therapy through December 2.

## 2013-06-13 ENCOUNTER — Other Ambulatory Visit: Payer: Self-pay | Admitting: Family Medicine

## 2013-06-13 ENCOUNTER — Telehealth: Payer: Self-pay | Admitting: Family Medicine

## 2013-06-13 LAB — CBC WITH DIFFERENTIAL/PLATELET
Basophils Absolute: 0.1 10*3/uL (ref 0.0–0.1)
Eosinophils Absolute: 0.3 10*3/uL (ref 0.0–0.7)
HCT: 34.2 % — ABNORMAL LOW (ref 39.0–52.0)
Lymphocytes Relative: 19 % (ref 12–46)
Lymphs Abs: 1.3 10*3/uL (ref 0.7–4.0)
MCV: 83.2 fL (ref 78.0–100.0)
Monocytes Absolute: 0.7 10*3/uL (ref 0.1–1.0)
Monocytes Relative: 10 % (ref 3–12)
Neutro Abs: 4.5 10*3/uL (ref 1.7–7.7)
Platelets: 406 10*3/uL — ABNORMAL HIGH (ref 150–400)
RBC: 4.11 MIL/uL — ABNORMAL LOW (ref 4.22–5.81)
RDW: 13.6 % (ref 11.5–15.5)
WBC: 6.9 10*3/uL (ref 4.0–10.5)

## 2013-06-13 LAB — COMPREHENSIVE METABOLIC PANEL
ALT: 13 U/L (ref 0–53)
AST: 20 U/L (ref 0–37)
Albumin: 3.3 g/dL — ABNORMAL LOW (ref 3.5–5.2)
Alkaline Phosphatase: 89 U/L (ref 39–117)
BUN: 10 mg/dL (ref 6–23)
Calcium: 8.7 mg/dL (ref 8.4–10.5)
Chloride: 99 mEq/L (ref 96–112)
Potassium: 5 mEq/L (ref 3.5–5.3)
Sodium: 136 mEq/L (ref 135–145)
Total Protein: 6.9 g/dL (ref 6.0–8.3)

## 2013-06-13 LAB — SEDIMENTATION RATE: Sed Rate: 79 mm/hr — ABNORMAL HIGH (ref 0–16)

## 2013-06-14 NOTE — Telephone Encounter (Signed)
Pt called and said to disregard refill request -he has a presc, at [facility].  rs

## 2013-06-14 NOTE — Telephone Encounter (Signed)
Please call this prescription in with refills

## 2013-06-14 NOTE — Telephone Encounter (Signed)
cALLED IN 

## 2013-06-18 DIAGNOSIS — E119 Type 2 diabetes mellitus without complications: Secondary | ICD-10-CM

## 2013-06-18 DIAGNOSIS — A4902 Methicillin resistant Staphylococcus aureus infection, unspecified site: Secondary | ICD-10-CM

## 2013-06-18 DIAGNOSIS — M009 Pyogenic arthritis, unspecified: Secondary | ICD-10-CM

## 2013-06-18 DIAGNOSIS — I10 Essential (primary) hypertension: Secondary | ICD-10-CM

## 2013-06-20 ENCOUNTER — Encounter: Payer: Self-pay | Admitting: Internal Medicine

## 2013-06-20 ENCOUNTER — Telehealth: Payer: Self-pay | Admitting: *Deleted

## 2013-06-20 ENCOUNTER — Telehealth: Payer: Self-pay | Admitting: Family Medicine

## 2013-06-20 ENCOUNTER — Ambulatory Visit (INDEPENDENT_AMBULATORY_CARE_PROVIDER_SITE_OTHER): Payer: Medicare Other | Admitting: Internal Medicine

## 2013-06-20 VITALS — BP 145/102 | HR 97 | Temp 98.4°F | Ht 70.0 in | Wt 298.0 lb

## 2013-06-20 DIAGNOSIS — M65839 Other synovitis and tenosynovitis, unspecified forearm: Secondary | ICD-10-CM

## 2013-06-20 DIAGNOSIS — Z23 Encounter for immunization: Secondary | ICD-10-CM

## 2013-06-20 NOTE — Progress Notes (Addendum)
Patient ID: Seth Martinez, male   DOB: 02-13-1951, 62 y.o.   MRN: [id number]         [facility] for Infectious Disease  Patient Active Problem List   Diagnosis Date Noted  . Rash and nonspecific skin eruption 05/15/2013  . Morbid obesity 05/04/2013  . Staphylococcus aureus septicemia 05/04/2013  . Septic arthritis of multiple joints 05/02/2013  . Hyperlipemia 12/28/2012  . Hypertension 12/28/2012  . Vitamin D deficiency 12/28/2012    Patient's Medications  New Prescriptions   No medications on file  Previous Medications   DIPHENHYDRAMINE (BENADRYL) 25 MG TABLET    Take 25 mg by mouth daily.   OXYCODONE-ACETAMINOPHEN (PERCOCET/ROXICET) 5-325 MG PER TABLET    Take 1 tablet by mouth 3 (three) times daily as needed.   RAMIPRIL (ALTACE) 2.5 MG CAPSULE    Take 1 capsule (2.5 mg total) by mouth daily.   SODIUM CHLORIDE 0.9 % SOLN 100 ML WITH DAPTOMYCIN 500 MG SOLR 1,000 MG    Inject 1,000 mg into the vein daily.  Modified Medications   No medications on file  Discontinued Medications   No medications on file    Subjective: Seth Martinez is in for his routine visit. He is now completed 7 weeks of IV antibiotic therapy. He's had no problems tolerating his PICC or daptomycin. Overall he is doing better and has been able to cut down on his Percocet. However he continues to be bothered by swelling and discomfort at the base of his right index finger. He saw Dr. Onalee Martinez yesterday and underwent an MRI of his right hand. Results of the MRI are not available to me yet. He is tentatively scheduled for surgery on that hand later this week. Review of Systems: Pertinent items are noted in HPI.  Past Medical History  Diagnosis Date  . Hyperlipidemia   . Chronic back pain   . Metabolic syndrome   . Depression   . Insomnia   . Fatigue   . Vertigo   . Joint pain   . Obesity   . Hypertension   . Septic arthritis October/2014  . Meniscus tear     bilateral knees    History    Substance Use Topics  . Smoking status: Never Smoker   . Smokeless tobacco: Never Used  . Alcohol Use: No     Comment: rare    Family History  Problem Relation Age of Onset  . Stroke Father   . Diabetes type II Brother     Allergies  Allergen Reactions  . Morphine And Related Anaphylaxis  . Penicillins Anaphylaxis  . Vancomycin Rash    Vasculitis  . Amitriptyline     unk  . Antara [Fenofibrate Micronized]     unk  . Bextra [Valdecoxib]     unk  . Cefdinir     unk  . Celebrex [Celecoxib]     unk  . Crestor [Rosuvastatin Calcium]     unk  . Escitalopram Oxalate     unk  . Fish Oil     unk  . Ibuprofen     unk  . Naproxen Sodium     unk  . Neurontin [Gabapentin]     unk  . Skelaxin     unk  . Ultram [Tramadol]     unk  . Vioxx [Rofecoxib]     unk  . Zocor [Simvastatin]     unk    Objective: Temp: 98.4 F (36.9 C) (12/02 1009) Temp  src: Oral (12/02 1009) BP: 145/102 mmHg (12/02 1009) Pulse Rate: 97 (12/02 1009)  General: He is in good spirits Skin: Left arm PICC site is normal Lungs: Clear Cor: Regular S1-S2 no murmurs Swelling in his left knee has improved. He has increased range of motion of his right shoulder. The swelling on the dorsum of his right hand has improved but he is persistent swelling and some mild redness on the palmar side of his hand at the base of the index finger  Lab Results  Component Value Date   CRP 3.9* 06/13/2013   Lab Results  Component Value Date   ESRSEDRATE 79* 06/13/2013      Assessment: He may have some persistent MRSA infection of his right hand.  Plan: 1. Continue daptomycin for now 2. Review results of the MRI with Dr. Amanda Martinez 3. Followup in one week   Seth Asters, MD [facility] for Infectious Disease [facility] [phone] pager   [phone] cell 06/20/2013, 10:56 AM  Addendum: I spoke with Dr. Amanda Martinez this afternoon. He reports that the MRI showed some diffuse tenosynovitis of the  flexor tendon sheath of the right index finger. No obvious abscess was noted. There is a reactive bone marrow edema but no obvious osteomyelitis. No extended the course of his daptomycin. He plans on taking him to the operating room for incision and drainage later this week.  Seth Asters, MD [facility] for Infectious Disease [facility] [phone] pager   [phone] cell 06/20/2013, 5:11 PM

## 2013-06-20 NOTE — Telephone Encounter (Signed)
Message copied by Azalee Course on Tue Jun 20, 2013 11:27 AM ------      Message from: Ernestina Penna      Created: Wed Jun 14, 2013  7:43 AM       The sedimentation rate is 79 which is decreased from a week ago when it was 116. The CK is 67. On the CMP the blood sugar is 106, creatinine is 0.95, and a liver function test are within normal limits except the albumin is slightly decreased ------

## 2013-06-20 NOTE — Telephone Encounter (Signed)
Requested [facility] Extend Daptomycin to 06/27/13.  [place], [facility] Pharmacist verbalized back order.

## 2013-06-22 ENCOUNTER — Other Ambulatory Visit: Payer: Self-pay | Admitting: Orthopedic Surgery

## 2013-06-22 ENCOUNTER — Encounter (HOSPITAL_BASED_OUTPATIENT_CLINIC_OR_DEPARTMENT_OTHER): Payer: Self-pay | Admitting: *Deleted

## 2013-06-22 NOTE — Progress Notes (Signed)
Pt was in hospital septic artritis all over-10/14-still getting daily iv antibiotics am-picc lt arm Labs done 06/13/13-ekg 10/14-echo done 10/14 ef 65-70%-very small PFO Will do iv antibiotic 6am as sch-

## 2013-06-22 NOTE — Progress Notes (Signed)
06/22/13 1030  OBSTRUCTIVE SLEEP APNEA  Have you ever been diagnosed with sleep apnea through a sleep study? No  Do you snore loudly (loud enough to be heard through closed doors)?  0  Do you often feel tired, fatigued, or sleepy during the daytime? 0  Has anyone observed you stop breathing during your sleep? 0  Do you have, or are you being treated for high blood pressure? 1  BMI more than 35 kg/m2? 1  Age over 64 years old? 1  Gender: 1  Obstructive Sleep Apnea Score 4  Score 4 or greater  Results sent to PCP

## 2013-06-23 ENCOUNTER — Ambulatory Visit (HOSPITAL_BASED_OUTPATIENT_CLINIC_OR_DEPARTMENT_OTHER)
Admission: RE | Admit: 2013-06-23 | Discharge: 2013-06-23 | Disposition: A | Discharge status: Home / Self Care | Payer: Medicare Other | Source: Ambulatory Visit | Attending: Orthopedic Surgery | Admitting: Orthopedic Surgery

## 2013-06-23 ENCOUNTER — Encounter (HOSPITAL_BASED_OUTPATIENT_CLINIC_OR_DEPARTMENT_OTHER): Payer: Self-pay | Admitting: Certified Registered"

## 2013-06-23 ENCOUNTER — Encounter (HOSPITAL_BASED_OUTPATIENT_CLINIC_OR_DEPARTMENT_OTHER): Payer: Medicare Other | Admitting: Certified Registered"

## 2013-06-23 ENCOUNTER — Ambulatory Visit (HOSPITAL_BASED_OUTPATIENT_CLINIC_OR_DEPARTMENT_OTHER): Payer: Medicare Other | Admitting: Certified Registered"

## 2013-06-23 ENCOUNTER — Encounter (HOSPITAL_BASED_OUTPATIENT_CLINIC_OR_DEPARTMENT_OTHER)
Admission: RE | Disposition: A | Discharge status: Home / Self Care | Payer: Self-pay | Source: Ambulatory Visit | Attending: Orthopedic Surgery

## 2013-06-23 DIAGNOSIS — E785 Hyperlipidemia, unspecified: Secondary | ICD-10-CM | POA: Insufficient documentation

## 2013-06-23 DIAGNOSIS — F3289 Other specified depressive episodes: Secondary | ICD-10-CM | POA: Insufficient documentation

## 2013-06-23 DIAGNOSIS — G4700 Insomnia, unspecified: Secondary | ICD-10-CM | POA: Insufficient documentation

## 2013-06-23 DIAGNOSIS — I10 Essential (primary) hypertension: Secondary | ICD-10-CM | POA: Insufficient documentation

## 2013-06-23 DIAGNOSIS — F329 Major depressive disorder, single episode, unspecified: Secondary | ICD-10-CM | POA: Insufficient documentation

## 2013-06-23 DIAGNOSIS — M009 Pyogenic arthritis, unspecified: Secondary | ICD-10-CM | POA: Insufficient documentation

## 2013-06-23 DIAGNOSIS — M65839 Other synovitis and tenosynovitis, unspecified forearm: Secondary | ICD-10-CM | POA: Insufficient documentation

## 2013-06-23 DIAGNOSIS — E669 Obesity, unspecified: Secondary | ICD-10-CM | POA: Insufficient documentation

## 2013-06-23 DIAGNOSIS — Z79899 Other long term (current) drug therapy: Secondary | ICD-10-CM | POA: Insufficient documentation

## 2013-06-23 HISTORY — PX: TENDON REPAIR: SHX5111

## 2013-06-23 HISTORY — PX: I & D EXTREMITY: SHX5045

## 2013-06-23 SURGERY — TENDON REPAIR
Anesthesia: General | Site: Hand | Laterality: Right

## 2013-06-23 MED ORDER — FENTANYL CITRATE 0.05 MG/ML IJ SOLN
INTRAMUSCULAR | Status: DC | PRN
  Administered 2013-06-23 (×6): 50 ug via INTRAVENOUS

## 2013-06-23 MED ORDER — LIDOCAINE HCL (CARDIAC) 20 MG/ML IV SOLN
INTRAVENOUS | Status: DC | PRN
  Administered 2013-06-23: 60 mg via INTRAVENOUS

## 2013-06-23 MED ORDER — FENTANYL CITRATE 0.05 MG/ML IJ SOLN
INTRAMUSCULAR | Status: AC
  Filled 2013-06-23: qty 6

## 2013-06-23 MED ORDER — ONDANSETRON HCL 4 MG/2ML IJ SOLN
INTRAMUSCULAR | Status: DC | PRN
  Administered 2013-06-23: 4 mg via INTRAVENOUS

## 2013-06-23 MED ORDER — PROPOFOL 10 MG/ML IV BOLUS
INTRAVENOUS | Status: DC | PRN
  Administered 2013-06-23: 150 mg via INTRAVENOUS

## 2013-06-23 MED ORDER — OXYCODONE HCL 5 MG PO TABS: 5.0000 mg | ORAL_TABLET | ORAL | Status: DC | PRN

## 2013-06-23 MED ORDER — SODIUM CHLORIDE 0.45 % IV SOLN: INTRAVENOUS | Status: DC

## 2013-06-23 MED ORDER — DEXAMETHASONE SODIUM PHOSPHATE 10 MG/ML IJ SOLN
INTRAMUSCULAR | Status: DC | PRN
  Administered 2013-06-23: 4 mg via INTRAVENOUS

## 2013-06-23 MED ORDER — FENTANYL CITRATE 0.05 MG/ML IJ SOLN
50.0000 ug | INTRAMUSCULAR | Status: DC | PRN
  Administered 2013-06-23: 50 ug via INTRAVENOUS

## 2013-06-23 MED ORDER — BUPIVACAINE HCL (PF) 0.5 % IJ SOLN
INTRAMUSCULAR | Status: DC | PRN
  Administered 2013-06-23: 10 mL

## 2013-06-23 MED ORDER — LACTATED RINGERS IV SOLN
INTRAVENOUS | Status: DC
  Administered 2013-06-23 (×2): via INTRAVENOUS

## 2013-06-23 MED ORDER — ONDANSETRON HCL 4 MG/2ML IJ SOLN: 4.0000 mg | Freq: Once | INTRAMUSCULAR | Status: DC | PRN

## 2013-06-23 MED ORDER — FENTANYL CITRATE 0.05 MG/ML IJ SOLN
INTRAMUSCULAR | Status: AC
  Filled 2013-06-23: qty 2

## 2013-06-23 MED ORDER — FENTANYL CITRATE 0.05 MG/ML IJ SOLN
25.0000 ug | INTRAMUSCULAR | Status: DC | PRN
  Administered 2013-06-23 (×2): 25 ug via INTRAVENOUS

## 2013-06-23 MED ORDER — OXYCODONE HCL 5 MG PO TABS
ORAL_TABLET | ORAL | Status: AC
  Filled 2013-06-23: qty 1

## 2013-06-23 MED ORDER — MIDAZOLAM HCL 2 MG/2ML IJ SOLN
INTRAMUSCULAR | Status: AC
  Filled 2013-06-23: qty 2

## 2013-06-23 MED ORDER — MIDAZOLAM HCL 5 MG/5ML IJ SOLN
INTRAMUSCULAR | Status: DC | PRN
  Administered 2013-06-23: 1 mg via INTRAVENOUS

## 2013-06-23 MED ORDER — CHLORHEXIDINE GLUCONATE 4 % EX LIQD: 60.0000 mL | Freq: Once | CUTANEOUS | Status: DC

## 2013-06-23 MED ORDER — HEPARIN SOD (PORK) LOCK FLUSH 100 UNIT/ML IV SOLN
250.0000 [IU] | INTRAVENOUS | Status: DC | PRN
  Administered 2013-06-23: 250 [IU]

## 2013-06-23 MED ORDER — MIDAZOLAM HCL 2 MG/2ML IJ SOLN: 1.0000 mg | INTRAMUSCULAR | Status: DC | PRN

## 2013-06-23 MED ORDER — OXYCODONE HCL 5 MG PO TABS
5.0000 mg | ORAL_TABLET | Freq: Once | ORAL | Status: AC
  Administered 2013-06-23: 5 mg via ORAL

## 2013-06-23 SURGICAL SUPPLY — 65 items
BANDAGE ELASTIC 3 VELCRO ST LF (GAUZE/BANDAGES/DRESSINGS) ×4 IMPLANT
BLADE OSC/SAG .038X5.5 CUT EDG (BLADE) IMPLANT
BLADE SURG 15 STRL LF DISP TIS (BLADE) ×3 IMPLANT
BLADE SURG 15 STRL SS (BLADE) ×4
BLADE SURG ROTATE 9660 (MISCELLANEOUS) ×1 IMPLANT
BNDG CONFORM 3 STRL LF (GAUZE/BANDAGES/DRESSINGS) ×2 IMPLANT
BNDG GAUZE ELAST 4 BULKY (GAUZE/BANDAGES/DRESSINGS) ×2 IMPLANT
BRUSH SCRUB EZ PLAIN DRY (MISCELLANEOUS) ×2 IMPLANT
CANISTER SUCT 1200ML W/VALVE (MISCELLANEOUS) ×2 IMPLANT
CORDS BIPOLAR (ELECTRODE) ×2 IMPLANT
COVER MAYO STAND STRL (DRAPES) ×2 IMPLANT
COVER TABLE BACK 60X90 (DRAPES) ×2 IMPLANT
CUFF TOURNIQUET SINGLE 18IN (TOURNIQUET CUFF) ×1 IMPLANT
DECANTER SPIKE VIAL GLASS SM (MISCELLANEOUS) IMPLANT
DRAIN JACKSON RD 7FR 3/32 (WOUND CARE) IMPLANT
DRAIN TLS ROUND 10FR (DRAIN) ×1 IMPLANT
DRAPE EXTREMITY T 121X128X90 (DRAPE) ×2 IMPLANT
DRAPE SURG 17X23 STRL (DRAPES) ×2 IMPLANT
DRSG EMULSION OIL 3X3 NADH (GAUZE/BANDAGES/DRESSINGS) ×2 IMPLANT
GAUZE SPONGE 4X4 16PLY XRAY LF (GAUZE/BANDAGES/DRESSINGS) IMPLANT
GAUZE XEROFORM 5X9 LF (GAUZE/BANDAGES/DRESSINGS) ×1 IMPLANT
GLOVE BIO SURGEON STRL SZ 6.5 (GLOVE) ×1 IMPLANT
GLOVE BIO SURGEON STRL SZ7 (GLOVE) ×1 IMPLANT
GLOVE BIO SURGEON STRL SZ8 (GLOVE) ×2 IMPLANT
GLOVE BIOGEL PI IND STRL 7.0 (GLOVE) IMPLANT
GLOVE BIOGEL PI INDICATOR 7.0 (GLOVE) ×1
GLOVE SS BIOGEL STRL SZ 8 (GLOVE) ×1 IMPLANT
GLOVE SUPERSENSE BIOGEL SZ 8 (GLOVE) ×1
GOWN PREVENTION PLUS XLARGE (GOWN DISPOSABLE) ×3 IMPLANT
GOWN PREVENTION PLUS XXLARGE (GOWN DISPOSABLE) ×2 IMPLANT
NDL HYPO 25X1 1.5 SAFETY (NEEDLE) ×1 IMPLANT
NEEDLE HYPO 22GX1.5 SAFETY (NEEDLE) IMPLANT
NEEDLE HYPO 25X1 1.5 SAFETY (NEEDLE) ×2 IMPLANT
NS IRRIG 1000ML POUR BTL (IV SOLUTION) ×2 IMPLANT
PACK BASIN DAY SURGERY FS (CUSTOM PROCEDURE TRAY) ×2 IMPLANT
PAD CAST 3X4 CTTN HI CHSV (CAST SUPPLIES) ×2 IMPLANT
PADDING CAST ABS 3INX4YD NS (CAST SUPPLIES) ×1
PADDING CAST ABS 4INX4YD NS (CAST SUPPLIES)
PADDING CAST ABS COTTON 3X4 (CAST SUPPLIES) ×1 IMPLANT
PADDING CAST ABS COTTON 4X4 ST (CAST SUPPLIES) ×1 IMPLANT
PADDING CAST COTTON 3X4 STRL (CAST SUPPLIES) ×4
PASSER SUT SWANSON 36MM LOOP (INSTRUMENTS) IMPLANT
SHEET MEDIUM DRAPE 40X70 STRL (DRAPES) IMPLANT
SPLINT FIBERGLASS 4X30 (CAST SUPPLIES) ×1 IMPLANT
SPLINT PLASTER CAST XFAST 3X15 (CAST SUPPLIES) IMPLANT
SPLINT PLASTER XTRA FASTSET 3X (CAST SUPPLIES)
STOCKINETTE 4X48 STRL (DRAPES) ×2 IMPLANT
STOCKINETTE SYNTHETIC 3 UNSTER (CAST SUPPLIES) ×2 IMPLANT
SUCTION FRAZIER TIP 10 FR DISP (SUCTIONS) ×2 IMPLANT
SUT BONE WAX W31G (SUTURE) IMPLANT
SUT FIBERWIRE 3-0 18 TAPR NDL (SUTURE)
SUT FIBERWIRE 4-0 18 TAPR NDL (SUTURE)
SUT PROLENE 4 0 PS 2 18 (SUTURE) ×7 IMPLANT
SUT VIC AB 4-0 P-3 18XBRD (SUTURE) IMPLANT
SUT VIC AB 4-0 P3 18 (SUTURE)
SUTURE FIBERWR 3-0 18 TAPR NDL (SUTURE) IMPLANT
SUTURE FIBERWR 4-0 18 TAPR NDL (SUTURE) ×2 IMPLANT
SYR BULB 3OZ (MISCELLANEOUS) ×2 IMPLANT
SYR CONTROL 10ML LL (SYRINGE) ×3 IMPLANT
TAPE SURG TRANSPORE 1 IN (GAUZE/BANDAGES/DRESSINGS) ×1 IMPLANT
TAPE SURGICAL TRANSPORE 1 IN (GAUZE/BANDAGES/DRESSINGS) ×1
TOWEL OR 17X24 6PK STRL BLUE (TOWEL DISPOSABLE) ×4 IMPLANT
TOWEL OR NON WOVEN STRL DISP B (DISPOSABLE) ×2 IMPLANT
TUBE CONNECTING 20X1/4 (TUBING) ×2 IMPLANT
UNDERPAD 30X30 INCONTINENT (UNDERPADS AND DIAPERS) ×2 IMPLANT

## 2013-06-23 NOTE — Anesthesia Preprocedure Evaluation (Addendum)
Anesthesia Evaluation  Patient identified by MRN, date of birth, ID band Patient awake    Reviewed: Allergy & Precautions, H&P , NPO status , Patient's Chart, lab work & pertinent test results  Airway Mallampati: II TM Distance: >3 FB Neck ROM: Full    Dental  (+) Teeth Intact and Dental Advisory Given   Pulmonary  breath sounds clear to auscultation        Cardiovascular hypertension, Rhythm:Regular Rate:Normal     Neuro/Psych    GI/Hepatic   Endo/Other    Renal/GU      Musculoskeletal   Abdominal   Peds  Hematology   Anesthesia Other Findings   Reproductive/Obstetrics                         Anesthesia Physical Anesthesia Plan  ASA: III  Anesthesia Plan: General   Post-op Pain Management:    Induction: Intravenous  Airway Management Planned: LMA  Additional Equipment:   Intra-op Plan:   Post-operative Plan:   Informed Consent: I have reviewed the patients History and Physical, chart, labs and discussed the procedure including the risks, benefits and alternatives for the proposed anesthesia with the patient or authorized representative who has indicated his/her understanding and acceptance.   Dental advisory given  Plan Discussed with: CRNA and Anesthesiologist  Anesthesia Plan Comments: (S/P MRSA septicemia, 04/2013 with septic arthritis L. Knee and R. Index finger Obesity Htn  Plan GA with LMA  )        Anesthesia Quick Evaluation

## 2013-06-23 NOTE — Op Note (Signed)
See Dictation #[id number] GramigMD

## 2013-06-23 NOTE — Anesthesia Postprocedure Evaluation (Signed)
  Anesthesia Post-op Note  Patient: Seth Martinez  Procedure(s) Performed: Procedure(s): RIGHT INDEX FINGER AND PALM IRRIGATION AND DEBRIDEMENT FLEXOR  TENOSYNOVECTOMY BX AND REPAIR AS NECESSARY  (Right) IRRIGATION AND DEBRIDEMENT EXTREMITY (Right)  Patient Location: PACU  Anesthesia Type:General  Level of Consciousness: awake, alert  and oriented  Airway and Oxygen Therapy: Patient Spontanous Breathing and Patient connected to nasal cannula oxygen  Post-op Pain: mild  Post-op Assessment: Post-op Vital signs reviewed, Patient's Cardiovascular Status Stable, Respiratory Function Stable, Patent Airway and Pain level controlled  Post-op Vital Signs: stable  Complications: No apparent anesthesia complications

## 2013-06-23 NOTE — Transfer of Care (Signed)
Immediate Anesthesia Transfer of Care Note  Patient: Seth Martinez  Procedure(s) Performed: Procedure(s): RIGHT INDEX FINGER AND PALM IRRIGATION AND DEBRIDEMENT FLEXOR  TENOSYNOVECTOMY BX AND REPAIR AS NECESSARY  (Right) IRRIGATION AND DEBRIDEMENT EXTREMITY (Right)  Patient Location: PACU  Anesthesia Type:General  Level of Consciousness: awake and patient cooperative  Airway & Oxygen Therapy: Patient Spontanous Breathing and Patient connected to face mask oxygen  Post-op Assessment: Report given to PACU RN and Post -op Vital signs reviewed and stable  Post vital signs: Reviewed and stable  Complications: No apparent anesthesia complications

## 2013-06-23 NOTE — Anesthesia Procedure Notes (Signed)
Procedure Name: LMA Insertion Date/Time: 06/23/2013 2:32 PM Performed by: Nell Gales Pre-anesthesia Checklist: Patient identified, Emergency Drugs available, Suction available and Patient being monitored Patient Re-evaluated:Patient Re-evaluated prior to inductionOxygen Delivery Method: Circle System Utilized Preoxygenation: Pre-oxygenation with 100% oxygen Intubation Type: IV induction Ventilation: Mask ventilation without difficulty LMA: LMA inserted LMA Size: 5.0 Number of attempts: 1 Airway Equipment and Method: bite block Placement Confirmation: positive ETCO2 Tube secured with: Tape Dental Injury: Teeth and Oropharynx as per pre-operative assessment

## 2013-06-24 NOTE — H&P (Signed)
Seth Martinez is an 62 y.o. male.   Chief Complaint: Chronic tenosynovitis index finger right hand HPI: Patient presents for surgical endeavors regarding his right index finger chronic tenosynovitis. Please see his chart with extensive details.  Past Medical History  Diagnosis Date  . Hyperlipidemia   . Chronic back pain   . Metabolic syndrome   . Depression   . Insomnia   . Fatigue   . Vertigo   . Joint pain   . Obesity   . Hypertension   . Septic arthritis October/2014  . Meniscus tear     bilateral knees    Past Surgical History  Procedure Laterality Date  . Rt knee arthroscopic  07/2006  . Fusion lt sacrum with screws  1993  . Fix screws in sacrum  in office  1993  . Tee without cardioversion N/A 05/04/2013    Procedure: TRANSESOPHAGEAL ECHOCARDIOGRAM (TEE);  Surgeon: Donato Schultz, MD;  Location: [facility] ENDOSCOPY;  Service: Cardiovascular;  Laterality: N/A;  . Knee arthroscopy Left 05/04/2013    Procedure: ARTHROSCOPY KNEE WITH WASHOUT;  Surgeon: Sheral Apley, MD;  Location: [facility] OR;  Service: Orthopedics;  Laterality: Left;  . Elbow surgery  1965    right-fx  . Back surgery  1992    lumb fusion  . Hernia repair      bilat ing 70 weeks old  . Inguinal hernia repair      bilat age 62  . Orif fibula fracture  1956    lt-accident age 35yr-  . Orif radius & ulna fractures  1956    left-age 89  . Orif clavicle fracture  1956    left-age 89  . Orif pelvic fracture  1956    age 37    Family History  Problem Relation Age of Onset  . Stroke Father   . Diabetes type II Brother    Social History:  reports that he has never smoked. He has never used smokeless tobacco. He reports that he does not drink alcohol or use illicit drugs.  Allergies:  Allergies  Allergen Reactions  . Morphine And Related Anaphylaxis  . Penicillins Anaphylaxis  . Vancomycin Rash    Vasculitis  . Amitriptyline     unk  . Antara [Fenofibrate Micronized]     unk  . Bextra [Valdecoxib]    unk  . Cefdinir     unk  . Celebrex [Celecoxib]     unk  . Crestor [Rosuvastatin Calcium]     unk  . Escitalopram Oxalate     unk  . Fish Oil     unk  . Ibuprofen Swelling    unk  . Naproxen Sodium     unk  . Neurontin [Gabapentin]     unk  . Skelaxin     unk  . Ultram [Tramadol]     unk  . Vioxx [Rofecoxib]     unk  . Zocor [Simvastatin]     unk    No prescriptions prior to admission    Results for orders placed during the hospital encounter of 06/23/13 (from the past 48 hour(s))  FUNGUS CULTURE W SMEAR     Status: None   Collection Time    06/23/13  5:10 PM      Result Value Range   Specimen Description TISSUE RIGHT FINGER     Special Requests PT ON DAPTOMYCIN     Fungal Smear PENDING     Culture PENDING     Report Status PENDING  ANAEROBIC CULTURE     Status: None   Collection Time    06/23/13  5:10 PM      Result Value Range   Specimen Description TISSUE RIGHT FINGER     Special Requests PT ON DAPTOMYCIN     Gram Stain PENDING     Culture       Value: NO ANAEROBES ISOLATED; CULTURE IN PROGRESS FOR 5 DAYS     Performed at [facility]   Report Status PENDING    TISSUE CULTURE     Status: None   Collection Time    06/23/13  5:10 PM      Result Value Range   Specimen Description TISSUE RIGHT FINGER     Special Requests PT ON DAPTOMYCIN SPECIMEN NO 2     Gram Stain PENDING     Culture PENDING     Report Status PENDING    FUNGUS CULTURE W SMEAR     Status: None   Collection Time    06/23/13  5:10 PM      Result Value Range   Specimen Description TISSUE RIGHT FINGER     Special Requests PT ON DAPTOMYCIN SPECIMEN NO 2     Fungal Smear PENDING     Culture PENDING     Report Status PENDING    ANAEROBIC CULTURE     Status: None   Collection Time    06/23/13  5:10 PM      Result Value Range   Specimen Description TISSUE RIGHT FINGER     Special Requests PT ON DAPTOMYCIN SPECIMEN NO 2     Gram Stain       Value: RARE WBC PRESENT,BOTH PMN  AND MONONUCLEAR     NO SQUAMOUS EPITHELIAL CELLS SEEN     NO ORGANISMS SEEN     Performed at [facility]   Culture       Value: NO ANAEROBES ISOLATED; CULTURE IN PROGRESS FOR 5 DAYS     Performed at [facility]   Report Status PENDING     No results found.  Review of Systems  Respiratory: Negative.   Cardiovascular: Negative.   Gastrointestinal: Negative.   Genitourinary: Negative.     Blood pressure 139/75, pulse 81, temperature 98.7 F (37.1 C), temperature source Oral, resp. rate 20, height 5\' 10"  (1.778 m), weight 134.435 kg (296 lb 6 oz), SpO2 93.00%. Physical Exam patient has chronic tenosynovitis right index finger. He notes no locking popping or catching. I discussed all issues in detail please see via preoperative notes which describe his situation in extensive detail. Lungs are clear heart regular rate abdomen is nontender back and neck are nontender today. His left knee is stable and has been seen by Dr. Eulah Pont  Assessment/Plan We recommend surgical intervention in the form of tenosynovitis me repair reconstruction is necessary as detailed in my preop notes.  Please see preop notes.  Patient understands and agrees with the plan of care and we will proceed.  All questions have been addressed  .Marland KitchenWe are planning surgery for your upper extremity. The risk and benefits of surgery include risk of bleeding infection anesthesia damage to normal structures and failure of the surgery to accomplish its intended goals of relieving symptoms and restoring function with this in mind we'll going to proceed. I have specifically discussed with the patient the pre-and postoperative regime and the does and don'ts and risk and benefits in great detail. Risk and benefits of surgery also include risk  of dystrophy chronic nerve pain failure of the healing process to go onto completion and other inherent risks of surgery The relavent the pathophysiology of the disease/injury  process, as well as the alternatives for treatment and postoperative course of action has been discussed in great detail with the patient who desires to proceed.  We will do everything in our power to help you (the patient) restore function to the upper extremity. Is a pleasure to see this patient today.   Karen Chafe 06/24/2013, 2:35 PM

## 2013-06-26 ENCOUNTER — Encounter (HOSPITAL_BASED_OUTPATIENT_CLINIC_OR_DEPARTMENT_OTHER): Payer: Self-pay | Admitting: Orthopedic Surgery

## 2013-06-26 NOTE — Addendum Note (Signed)
Addendum created 06/26/13 1026 by Lance Coon, CRNA   Modules edited: Anesthesia Responsible Staff

## 2013-06-26 NOTE — Op Note (Signed)
NAME:  Seth Martinez, SCALA NO.:  [id number]  MEDICAL RECORD NO.:  [id number]  LOCATION:                               FACILITY:  [facility]  PHYSICIAN:  Alexis Mizuno. Rokhaya Quinn, M.D.DATE OF BIRTH:  07/15/1951  DATE OF PROCEDURE:  06/23/2013 DATE OF DISCHARGE:  06/23/2013                              OPERATIVE REPORT   PREOPERATIVE DIAGNOSES: 1. Chronic tenosynovitis, right index finger and palm. 2. History of major septic episode including left knee sepsis. 3. Current antibiotic treatment through PICC line.  POSTOPERATIVE DIAGNOSES: 1. Chronic tenosynovitis, right index finger and palm. 2. History of major septic episode including left knee sepsis. 3. Current antibiotic treatment through PICC line.  PROCEDURE: 1. Evaluation under anesthesia, right hand including the index finger     and palm. 2. Open tenosynovectomy of the FDP and FDS tendons, radical in nature     secondary to chronic exuberant tenosynovitis of an infectious     quality. 3. Neurolysis, radial digital nerve; neurolysis, ulnar digital nerve     both to the index finger and palm.  SURGEON:  Dionne Ano. Amanda Pea, MD.  ASSISTANT:  Karie Chimera, PAC.  COMPLICATIONS:  None.  ANESTHESIA:  General.  TOURNIQUET TIME:  Less than an hour.  DRAINS:  One #10 TLS drain was placed.  CULTURES:  Multiple cultures including tissue cultures were taken for aerobic, anaerobic, fungal, and atypical mycobacterial cultures.  INDICATIONS FOR THE PROCEDURE:  This patient is a 62 year old male who has had an unfortunate course of sepsis.  I first visited with the patient this week in my office at the request of the family.  The patient has a chronic exuberant tenosynovitis about the right index finger and palm.  This has not gone on to quiescence.  He cannot move the finger except for a flicker of motion at the distal interphalangeal joint, and has chronic pain and deformity associated with the large soft tissue mass.  I  obtained an MRI, which did show split tears in the tendons, the MRI which suggests chronic tenosynovitis, no osteomyelitis.  My concerns are atypical infection that is encompassed the sheath versus a walled off abscess versus a chronic inflammatory state that is simply not going to go on to a simple arrest via itself.  The patient understands this.  I would recommend debridement and a tenosynovectomy. I have discussed his care with Dr. Cliffton Asters of Infectious Disease. Given his sed rate and C-reactive protein, we are going to continue antibiotics.  His left knee is doing fairly well, but he does states that it generally hurts all over from all of these episodes.  PROCEDURE IN DETAIL:  The patient was seen by myself and Anesthesia, taken to operative suite, counseled, prepped and draped in a sterile fashion with Betadine scrub and paint.  Once in the operative arena, preoperative and postoperative check were complete.  Following prep and drape, the patient then had a final time-out called.  Evaluation under anesthesia revealed a notable amount of tenosynovitis, but no evidence of obvious instability.  I reviewed this at length and the findings. Once this was done, I made a modified Brunner incision, opened up skin flaps, and noted the significant  tenosynovitic issues around the flexor tendon sheath.  From the DIP to the area of the mid palm, I opened the area up, made skin flaps and sutured these back upon themselves.  Once this was done, I then noted that the A2 and A4 pulleys were identifiable, but there were severely stretched out due to the chronic exuberant tenosynovitis.  I kept the A2 and A4 pulleys preserved; however, the A2 pulley had a large amount of stretch injury to it secondary to the chronic inflammatory nature of the patient's problem. I opened A3 and excised it, excised what was remaining at A1 and following this, through window technique, I performed a  radical tenosynovectomy.  Both the FDS and the FDP had major partial-thickness tearing and devitalized portions of the tendon.  This was highly concerning for an atypical infection.  The patient had a honey crusted type look to the tissue.  With meticulous dissection, using a McCabe as well as combination of orthopedic hand instruments, we performed the radical tenosynovectomy.  As mentioned, we sent 2 sets of tissue cultures off for the above-mentioned aerobic, anaerobic, fungal, and atypical mycobacterial cultures.  We are certainly able to preserve the tendons, but did have to perform some major debridements as the patient had structural loss of the integrity, and certainly, I have great concern about the vascular integrity in regard to his vincular system given this chronicity.  Following this, I did explore the volar PIP region and the distal interphalangeal joint region.  I did not see any encroachment upon the joints themselves.  At this time, we deflated the tourniquet.  I took intraoperative photos before during and after for the family and irrigated with 3 L of saline.  Following this, I then placed a #10 TLS drain in the area of dissection and underneath the stretched out A2 pulley.  The patient tolerated this well.  We closed the skin flaps with excellent refill noted.  I should note that the radial and ulnar neurovascular bundles were identified, protected at all times and did not have any significant disease around them.  After the wound was closed, he was placed in a sterile dressing.  He is going to elevate, change the bandage on Monday in my office and notify me should any problems occur.  I will go ahead and see me in the office on Monday.  We will go ahead and perform a dressing change.  He will go downstairs at therapy.  I want to get a reading on his A2 pulley region given the stretch injury, and our office will call for this appointment and time.  The patient  and I have discussed these issues in detail preoperatively.  His preoperative findings were consistent with his intraoperative findings.  We will watch his condition closely.  I will be anxious to see the cultures; however, given the chronicity of antibiotic treatment, he may have a significant skewing of the results due to the antibiotics he has been on which leaves this in a bit of a quandary for the future.  I would absolutely continue antibiotics in aggressive fashion until the physical exam normalizes and his laboratory analysis sed rate and CBC, which have been quite high in normal life.  It was a pleasure to see him today.  I have discussed all issues and care plans with the family and do's and do not's were discussed.     Dionne Ano. Amanda Pea, M.D.   ______________________________ Dionne Ano. Amanda Pea, M.D.    [facility]  D:  06/23/2013  T:  06/24/2013  Job:  [id number]  cc:   Cliffton Asters, M.D. Sharlynn Oliphant

## 2013-06-27 ENCOUNTER — Ambulatory Visit (INDEPENDENT_AMBULATORY_CARE_PROVIDER_SITE_OTHER): Payer: Medicare Other | Admitting: Internal Medicine

## 2013-06-27 ENCOUNTER — Telehealth: Payer: Self-pay | Admitting: *Deleted

## 2013-06-27 ENCOUNTER — Encounter: Payer: Self-pay | Admitting: Internal Medicine

## 2013-06-27 VITALS — BP 151/93 | HR 97 | Temp 98.0°F | Ht 70.0 in | Wt 300.0 lb

## 2013-06-27 DIAGNOSIS — M009 Pyogenic arthritis, unspecified: Secondary | ICD-10-CM

## 2013-06-27 LAB — TISSUE CULTURE: Culture: NO GROWTH

## 2013-06-27 NOTE — Progress Notes (Signed)
Patient ID: Seth Martinez, male   DOB: [birth year], 62 y.o.   MRN: [id number]         [facility] for Infectious Disease  Patient Active Problem List   Diagnosis Date Noted  . Rash and nonspecific skin eruption 05/15/2013  . Morbid obesity 05/04/2013  . Staphylococcus aureus septicemia 05/04/2013  . Septic arthritis of multiple joints 05/02/2013  . Hyperlipemia 12/28/2012  . Hypertension 12/28/2012  . Vitamin D deficiency 12/28/2012    Patient's Medications  New Prescriptions   No medications on file  Previous Medications   CYCLOBENZAPRINE (FLEXERIL) 10 MG TABLET    Take 10 mg by mouth 3 (three) times daily as needed for muscle spasms.   DIPHENHYDRAMINE (BENADRYL) 25 MG TABLET    Take 25 mg by mouth daily.   OXYCODONE (ROXICODONE) 5 MG IMMEDIATE RELEASE TABLET    Take 1 tablet (5 mg total) by mouth every 4 (four) hours as needed for severe pain.   OXYCODONE-ACETAMINOPHEN (PERCOCET/ROXICET) 5-325 MG PER TABLET    Take 1 tablet by mouth 3 (three) times daily as needed.   RAMIPRIL (ALTACE) 2.5 MG CAPSULE    Take 1 capsule (2.5 mg total) by mouth daily.   SODIUM CHLORIDE 0.9 % SOLN 100 ML WITH DAPTOMYCIN 500 MG SOLR    Inject 1,200 mg into the vein daily.  Modified Medications   No medications on file  Discontinued Medications   No medications on file    Subjective: Mr. Mccalla is in for his routine followup visit. He underwent incision and drainage of his right hand infection 4 days ago by Dr. Onalee Hua. He had extensive tenosynovitis. He is now completed 8 weeks of IV antibiotics. He's had no problems tolerating his PICC line although his nurse has had some difficulty drawing blood from it recently. He has not had any problems tolerating his daptomycin.  Review of Systems: Pertinent items are noted in HPI.  Past Medical History  Diagnosis Date  . Hyperlipidemia   . Chronic back pain   . Metabolic syndrome   . Depression   . Insomnia   . Fatigue   . Vertigo     . Joint pain   . Obesity   . Hypertension   . Septic arthritis October/2014  . Meniscus tear     bilateral knees    History  Substance Use Topics  . Smoking status: Never Smoker   . Smokeless tobacco: Never Used  . Alcohol Use: No     Comment: rare    Family History  Problem Relation Age of Onset  . Stroke Father   . Diabetes type II Brother     Allergies  Allergen Reactions  . Morphine And Related Anaphylaxis  . Penicillins Anaphylaxis  . Vancomycin Rash    Vasculitis  . Amitriptyline     unk  . Antara [Fenofibrate Micronized]     unk  . Bextra [Valdecoxib]     unk  . Cefdinir     unk  . Celebrex [Celecoxib]     unk  . Crestor [Rosuvastatin Calcium]     unk  . Escitalopram Oxalate     unk  . Fish Oil     unk  . Ibuprofen Swelling    unk  . Naproxen Sodium     unk  . Neurontin [Gabapentin]     unk  . Skelaxin     unk  . Ultram [Tramadol]     unk  . Vioxx [Rofecoxib]  unk  . Zocor [Simvastatin]     unk    Objective: Temp: 98 F (36.7 C) (12/09 1124) Temp src: Oral (12/09 1124) BP: 151/93 mmHg (12/09 1124) Pulse Rate: 97 (12/09 1124)  General: He is in good spirits Skin: Left arm PICC site appears normal Right hand: He has stitches in his surgical incision. The swelling and redness has decreased.  Right hand specimens 06/23/2013: Gram, AFB and fungal stains negative for organisms. All cultures negative to date  Sedimentation rate 06/19/2013: 65  06/26/2013: 39 C reactive protein 06/19/2013: 2.1  06/26/2013: 0.5 CK 06/26/2013: 75    Assessment: His right hand inflammation is improved after incision and drainage. I am not surprised at all stains and cultures are negative. I have no reason to believe that his persistent hand problem is due to anything other than the MRSA that was isolated 2 months ago. The isolate was resistant to doxycycline and trimethoprim sulfamethoxazole which limits my ability to transition him to oral therapy. I  will plan on 2 more weeks of IV daptomycin and stop and have the PICC line removed.  Plan: 1. Continue daptomycin through December 23 then stop and have the PICC removed 2. Followup in 4 weeks   Cliffton Asters, MD [facility] for Infectious Disease [facility] [phone] pager   [phone] cell 06/27/2013, 11:52 AM

## 2013-06-27 NOTE — Telephone Encounter (Signed)
Order from Dr. Orvan Falconer.  Spoke with Larita Fife, [facility] Pharmacist.  Pharmacist verbalized back order. Continue Daptomycin x 2 weeks, then, remove PICC

## 2013-06-28 ENCOUNTER — Encounter: Payer: Self-pay | Admitting: Family Medicine

## 2013-06-28 ENCOUNTER — Encounter (INDEPENDENT_AMBULATORY_CARE_PROVIDER_SITE_OTHER): Payer: Medicare Other | Admitting: Family Medicine

## 2013-06-28 LAB — ANAEROBIC CULTURE

## 2013-06-28 MED ORDER — OXYCODONE-ACETAMINOPHEN 5-325 MG PO TABS: 1.0000 | ORAL_TABLET | Freq: Three times a day (TID) | ORAL | Status: DC | PRN

## 2013-06-28 NOTE — Progress Notes (Signed)
   Subjective:    Patient ID: Seth Martinez, male    DOB: November 13, 1950, 62 y.o.   MRN: [id number]  HPI Pt here for follow up and management of chronic medical problems.       Patient Active Problem List   Diagnosis Date Noted  . Rash and nonspecific skin eruption 05/15/2013  . Morbid obesity 05/04/2013  . Staphylococcus aureus septicemia 05/04/2013  . Septic arthritis of multiple joints 05/02/2013  . Hyperlipemia 12/28/2012  . Hypertension 12/28/2012  . Vitamin D deficiency 12/28/2012   Outpatient Encounter Prescriptions as of 06/28/2013  Medication Sig  . CUBICIN 500 MG injection   . diphenhydrAMINE (BENADRYL) 25 MG tablet Take 25 mg by mouth daily.  Marland Kitchen oxyCODONE-acetaminophen (PERCOCET/ROXICET) 5-325 MG per tablet Take 1 tablet by mouth 3 (three) times daily as needed.  . ramipril (ALTACE) 2.5 MG capsule Take 1 capsule (2.5 mg total) by mouth daily.  . sodium chloride 0.9 % SOLN 100 mL with DAPTOmycin 500 MG SOLR Inject 1,200 mg into the vein daily.  . [DISCONTINUED] cyclobenzaprine (FLEXERIL) 10 MG tablet Take 10 mg by mouth 3 (three) times daily as needed for muscle spasms.  . [DISCONTINUED] oxyCODONE (ROXICODONE) 5 MG immediate release tablet Take 1 tablet (5 mg total) by mouth every 4 (four) hours as needed for severe pain.    Review of Systems  Constitutional: Negative.   HENT: Negative.   Eyes: Negative.   Respiratory: Negative.   Cardiovascular: Negative.   Gastrointestinal: Negative.   Endocrine: Negative.   Genitourinary: Negative.   Musculoskeletal: Positive for arthralgias (right shoulder pain, left knee pain, right hip area, right hand -had surgery last friday).  Skin: Negative.   Allergic/Immunologic: Negative.   Neurological: Negative.   Hematological: Negative.   Psychiatric/Behavioral: Negative.        Objective:   Physical Exam BP 147/96  Pulse 84  Temp(Src) 98.2 F (36.8 C) (Oral)  Ht 5\' 10"  (1.778 m)  Wt 296 lb (134.265 kg)  BMI 42.47  kg/m2        Assessment & Plan:

## 2013-06-29 LAB — TISSUE CULTURE

## 2013-07-06 ENCOUNTER — Telehealth: Payer: Self-pay | Admitting: Family Medicine

## 2013-07-06 ENCOUNTER — Telehealth: Payer: Self-pay | Admitting: *Deleted

## 2013-07-06 NOTE — Telephone Encounter (Signed)
Patient called to see what his most recent Sed Rate was. Advised the patient 07/03/13 is was 45. The patient is concerned as he is set to have his PICC out on 07/11/13 and does not want the infection to come back. He ask that Dr Orvan Falconer advise if he should hold on to the PICC as we do not see him back until 07/25/13. Advised the patient will ask the doctor and give him a call back.

## 2013-07-07 NOTE — Telephone Encounter (Signed)
Per Dr Orvan Falconer call the patient and offered him an appt for 07/11/13 at 930 am to talk about his antibiotics and PICC.

## 2013-07-07 NOTE — Telephone Encounter (Signed)
Please offer him the option of being added on to my schedule next Tuesday morning, December 23 if he would like to discuss stopping antibiotics in the PICC removal. I'm reluctant to have him keep the PICC in for an extra 2 weeks if it is not being used because of the potential risk of complications.

## 2013-07-10 NOTE — Telephone Encounter (Signed)
Pt said he already was notified

## 2013-07-11 ENCOUNTER — Ambulatory Visit (INDEPENDENT_AMBULATORY_CARE_PROVIDER_SITE_OTHER): Payer: Medicare Other | Admitting: Internal Medicine

## 2013-07-11 ENCOUNTER — Encounter: Payer: Self-pay | Admitting: Internal Medicine

## 2013-07-11 VITALS — BP 126/92 | HR 87 | Temp 98.2°F | Ht 71.0 in | Wt 299.5 lb

## 2013-07-11 DIAGNOSIS — M009 Pyogenic arthritis, unspecified: Secondary | ICD-10-CM

## 2013-07-11 NOTE — Progress Notes (Signed)
Patient ID: Seth Martinez, male   DOB: [birth year], 62 y.o.   MRN: [id number]         [facility] for Infectious Disease  Patient Active Problem List   Diagnosis Date Noted  . Rash and nonspecific skin eruption 05/15/2013  . Morbid obesity 05/04/2013  . Staphylococcus aureus septicemia 05/04/2013  . Septic arthritis of multiple joints 05/02/2013  . Hyperlipemia 12/28/2012  . Hypertension 12/28/2012  . Vitamin D deficiency 12/28/2012    Patient's Medications  New Prescriptions   No medications on file  Previous Medications   DIPHENHYDRAMINE (BENADRYL) 25 MG TABLET    Take 25 mg by mouth daily.   OXYCODONE-ACETAMINOPHEN (PERCOCET/ROXICET) 5-325 MG PER TABLET    Take 1 tablet by mouth 3 (three) times daily as needed.   RAMIPRIL (ALTACE) 2.5 MG CAPSULE    Take 1 capsule (2.5 mg total) by mouth daily.  Modified Medications   No medications on file  Discontinued Medications   CUBICIN 500 MG INJECTION       SODIUM CHLORIDE 0.9 % SOLN 100 ML WITH DAPTOMYCIN 500 MG SOLR    Inject 1,200 mg into the vein daily.    Subjective: Seth Martinez is in for his routine followup visit. He is feeling better. He is now completed 10 weeks of antibiotic therapy for his disseminated MRSA infection. He's had no problems tolerating his PICC or daptomycin. All cultures from his right hand surgery on December 5 remained negative.  Review of Systems: Pertinent items are noted in HPI.  Past Medical History  Diagnosis Date  . Hyperlipidemia   . Chronic back pain   . Metabolic syndrome   . Depression   . Insomnia   . Fatigue   . Vertigo   . Joint pain   . Obesity   . Hypertension   . Septic arthritis October/2014  . Meniscus tear     bilateral knees    History  Substance Use Topics  . Smoking status: Never Smoker   . Smokeless tobacco: Never Used  . Alcohol Use: No     Comment: rare    Family History  Problem Relation Age of Onset  . Stroke Father   . Diabetes type II Brother      Allergies  Allergen Reactions  . Morphine And Related Anaphylaxis  . Penicillins Anaphylaxis  . Vancomycin Rash    Vasculitis  . Amitriptyline     unk  . Antara [Fenofibrate Micronized]     unk  . Bextra [Valdecoxib]     unk  . Cefdinir     unk  . Celebrex [Celecoxib]     unk  . Crestor [Rosuvastatin Calcium]     unk  . Escitalopram Oxalate     unk  . Fish Oil     unk  . Ibuprofen Swelling    unk  . Naproxen Sodium     unk  . Neurontin [Gabapentin]     unk  . Skelaxin     unk  . Ultram [Tramadol]     unk  . Vioxx [Rofecoxib]     unk  . Zocor [Simvastatin]     unk    Objective: Temp: 98.2 F (36.8 C) (12/23 0931) Temp src: Oral (12/23 0931) BP: 126/92 mmHg (12/23 0931) Pulse Rate: 87 (12/23 0931)  General: He is in no distress and in good spirits Skin: Left arm PICC site is normal Lungs: Clear Cor: Regular S1-S2 no murmurs Joints extremities: He has some crepitus over  his left knee but no other signs of acute inflammation. His right hand inflammation has resolved and his incision is healing nicely. He has some tenderness over his right rotator cuff but no other signs of acute inflammation.  Lab Results ESR 07/09/2013: 36 CRP 07/09/2013: 1.4   Assessment: He is much improved and I believe that his MRSA infection it is probably cured at this point.  Plan: 1. Discontinue daptomycin and remove PICC 2. Followup in 2 weeks   Cliffton Asters, MD [facility] for Infectious Disease [facility] [phone] pager   [phone] cell 07/11/2013, 10:00 AM

## 2013-07-11 NOTE — Progress Notes (Signed)
RN received verbal order to discontinue the patient's PICC line.  Patient identified with name and date of birth. PICC dressing removed, site redden but skin not broken.  PICC line removed using sterile procedure @ 1010. PICC length equal to that noted in patient's hospital chart of 52 cm. Sterile petroleum gauze + sterile 4X4 applied to PICC site, pressure applied for 10 minutes and covered with Medipore tape as a pressure dressing. Patient tolerated procedure without complaints.  Patient instructed to limit use of arm for 1 hour. Patient instructed that the pressure dressing should remain in place for 24 hours.  RN advised the pt to remove old adhesive with olive oil tomorrow after removing pressure dressing.  Also advised to rub olive oil in to reddened skin to help with healing. Patient verbalized understanding of these instructions.

## 2013-07-18 NOTE — Telephone Encounter (Signed)
Patient said that he called 2 weeks ago for this message no longer needed it he found out after he was seen by another provider

## 2013-07-19 ENCOUNTER — Encounter: Payer: Self-pay | Admitting: Internal Medicine

## 2013-07-22 LAB — FUNGUS CULTURE W SMEAR
Fungal Smear: NONE SEEN
Fungal Smear: NONE SEEN

## 2013-07-25 ENCOUNTER — Ambulatory Visit (INDEPENDENT_AMBULATORY_CARE_PROVIDER_SITE_OTHER): Payer: Medicare Other | Admitting: Internal Medicine

## 2013-07-25 VITALS — Wt 306.0 lb

## 2013-07-25 DIAGNOSIS — R351 Nocturia: Secondary | ICD-10-CM | POA: Insufficient documentation

## 2013-07-25 NOTE — Progress Notes (Signed)
Patient ID: Seth Martinez, male   DOB: [birth year], 63 y.o.   MRN: [id number]         [facility]  Patient Active Problem List   Diagnosis Date Noted  . Nocturia 07/25/2013  . Rash and nonspecific skin eruption 05/15/2013  . Morbid obesity 05/04/2013  . Staphylococcus aureus septicemia 05/04/2013  . Septic arthritis of multiple joints 05/02/2013  . Hyperlipemia 12/28/2012  . Hypertension 12/28/2012  . Vitamin D deficiency 12/28/2012    Patient's Medications  New Prescriptions   No medications on file  Previous Medications   OXYCODONE-ACETAMINOPHEN (PERCOCET/ROXICET) 5-325 MG PER TABLET    Take 1 tablet by mouth 3 (three) times daily as needed.   RAMIPRIL (ALTACE) 2.5 MG CAPSULE    Take 1 capsule (2.5 mg total) by mouth daily.  Modified Medications   No medications on file  Discontinued Medications   DIPHENHYDRAMINE (BENADRYL) 25 MG TABLET    Take 25 mg by mouth daily.    Subjective: [name] is in for his routine followup visit. He has been off of antibiotics for 2 weeks. He completed a total of 10 weeks of IV daptomycin therapy for his severe, disseminated MRSA infection. He said no signs or symptoms to suggest recurrence of his infection over the past 2 weeks. He has not had any fever, chills or sweats. He continues to be bothered by stiffness and soreness particularly in his right hand and right shoulder. He saw Dr. Amanda PeaGramig recently and was prescribed Mobic to take in place of that were in addition to his Percocet but he has a history of allergies to NSAIDs so he did not take it. He states he is still requiring 2-3 Percocet daily to control his pain. He has been doing some exercises with a squeeze ball in his right hand but has not been consistently doing right shoulder exercises as prescribed by Dr. Amanda PeaGramig.  Review of Systems: Pertinent items are noted in HPI.  Past Medical History  Diagnosis Date  . Hyperlipidemia   . Chronic back pain     . Metabolic syndrome   . Depression   . Insomnia   . Fatigue   . Vertigo   . Joint pain   . Obesity   . Hypertension   . Septic arthritis October/2014  . Meniscus tear     bilateral knees    History  Substance Use Topics  . Smoking status: Never Smoker   . Smokeless tobacco: Never Used  . Alcohol Use: No     Comment: rare    Family History  Problem Relation Age of Onset  . Stroke Father   . Diabetes type II Brother     Allergies  Allergen Reactions  . Morphine And Related Anaphylaxis  . Penicillins Anaphylaxis  . Vancomycin Rash    Vasculitis  . Amitriptyline     unk  . Antara [Fenofibrate Micronized]     unk  . Bextra [Valdecoxib]     unk  . Cefdinir     unk  . Celebrex [Celecoxib]     unk  . Crestor [Rosuvastatin Calcium]     unk  . Escitalopram Oxalate     unk  . Fish Oil     unk  . Ibuprofen Swelling    unk  . Naproxen Sodium     unk  . Neurontin [Gabapentin]     unk  . Skelaxin     unk  . Ultram [Tramadol]  unk  . Vioxx [Rofecoxib]     unk  . Zocor [Simvastatin]     unk    Objective:    General: He is in no distress. He is very talkative. Skin: Former PICC site healing nicely Lungs: Clear Cor: Regular S1 and S2 with no murmurs Extremities: His right hand incision is healing nicely without any signs of active infection. He has no acute inflammation of his left knee or right shoulder.    Assessment: He is doing well and I suspect that his MRSA infection has been cured.  Plan: 1. Continue observation off of antibiotics 2. I encouraged him to be more diligent with his physical therapy 3. I suggested trying extra strength Tylenol in place of his Percocet 4. Followup in one month   Cliffton Asters, MD [facility] for Infectious Disease [facility] [phone] pager   [phone] cell 07/25/2013, 11:29 AM

## 2013-08-05 LAB — AFB CULTURE WITH SMEAR (NOT AT ARMC)
Acid Fast Smear: NONE SEEN
Acid Fast Smear: NONE SEEN

## 2013-08-19 DIAGNOSIS — I10 Essential (primary) hypertension: Secondary | ICD-10-CM

## 2013-08-19 DIAGNOSIS — E119 Type 2 diabetes mellitus without complications: Secondary | ICD-10-CM

## 2013-08-19 DIAGNOSIS — M009 Pyogenic arthritis, unspecified: Secondary | ICD-10-CM

## 2013-08-19 DIAGNOSIS — A4902 Methicillin resistant Staphylococcus aureus infection, unspecified site: Secondary | ICD-10-CM

## 2013-08-29 ENCOUNTER — Encounter: Payer: Self-pay | Admitting: Internal Medicine

## 2013-08-29 ENCOUNTER — Ambulatory Visit (INDEPENDENT_AMBULATORY_CARE_PROVIDER_SITE_OTHER): Payer: Medicare Other | Admitting: Internal Medicine

## 2013-08-29 VITALS — BP 160/102 | HR 72 | Temp 98.2°F | Wt 313.0 lb

## 2013-08-29 DIAGNOSIS — A419 Sepsis, unspecified organism: Secondary | ICD-10-CM

## 2013-08-29 DIAGNOSIS — M009 Pyogenic arthritis, unspecified: Secondary | ICD-10-CM

## 2013-08-29 DIAGNOSIS — A4101 Sepsis due to Methicillin susceptible Staphylococcus aureus: Secondary | ICD-10-CM

## 2013-08-29 NOTE — Progress Notes (Signed)
Patient ID: Seth Martinez, male   DOB: June 09, 1951, 63 y.o.   MRN: [id number]         [facility]  Patient Active Problem List   Diagnosis Date Noted  . Nocturia 07/25/2013  . Rash and nonspecific skin eruption 05/15/2013  . Morbid obesity 05/04/2013  . Staphylococcus aureus septicemia 05/04/2013  . Septic arthritis of multiple joints 05/02/2013  . Hyperlipemia 12/28/2012  . Hypertension 12/28/2012  . Vitamin D deficiency 12/28/2012    Patient's Medications  New Prescriptions   No medications on file  Previous Medications   OXYCODONE-ACETAMINOPHEN (PERCOCET/ROXICET) 5-325 MG PER TABLET    Take 1 tablet by mouth 3 (three) times daily as needed.   RAMIPRIL (ALTACE) 2.5 MG CAPSULE    Take 1 capsule (2.5 mg total) by mouth daily.  Modified Medications   No medications on file  Discontinued Medications   No medications on file    Subjective: [name] is in for his routine followup visit. He completed 10 weeks of IV daptomycin for severe, disseminated MRSA infection in late December. He is now been off all antibiotics for about 6 weeks and continues to improve slowly. He has not had any fever, chills or sweats. He still has limited strength and flexibility in his right hand, especially his index finger but is working on physical therapy. His wife was concerned that someone in the hospital told him that his problem could have been related to recent dental procedures. He wants to know if he needs to take an antibiotic when he visits the dentist. Review of Systems: Pertinent items are noted in HPI.  Past Medical History  Diagnosis Date  . Hyperlipidemia   . Chronic back pain   . Metabolic syndrome   . Depression   . Insomnia   . Fatigue   . Vertigo   . Joint pain   . Obesity   . Hypertension   . Septic arthritis October/2014  . Meniscus tear     bilateral knees    History  Substance Use Topics  . Smoking status: Never Smoker   .  Smokeless tobacco: Never Used  . Alcohol Use: No     Comment: rare    Family History  Problem Relation Age of Onset  . Stroke Father   . Diabetes type II Brother     Allergies  Allergen Reactions  . Morphine And Related Anaphylaxis  . Penicillins Anaphylaxis  . Vancomycin Rash    Vasculitis  . Amitriptyline     unk  . Antara [Fenofibrate Micronized]     unk  . Bextra [Valdecoxib]     unk  . Cefdinir     unk  . Celebrex [Celecoxib]     unk  . Crestor [Rosuvastatin Calcium]     unk  . Escitalopram Oxalate     unk  . Fish Oil     unk  . Ibuprofen Swelling    unk  . Naproxen Sodium     unk  . Neurontin [Gabapentin]     unk  . Skelaxin     unk  . Ultram [Tramadol]     unk  . Vioxx [Rofecoxib]     unk  . Zocor [Simvastatin]     unk    Objective: Temp: 98.2 F (36.8 C) (02/10 1104) Temp src: Oral (02/10 1104) BP: 160/102 mmHg (02/10 1104) Pulse Rate: 72 (02/10 1104)  General: He is in no distress Skin: No rash Lungs: Clear Cor:  Regular S1 and S2 with no murmur Joints and extremities: No acute inflammation of left knee or right shoulder. His right hand is healing without evidence of active infection.    Assessment: I believe that his MRSA infection has been cured. He does not need antibiotics for dental procedures.  Plan: 1. Observe off of antibiotics 2. Followup here as needed   Cliffton Asters, MD [facility] [phone] pager   [phone] cell 08/29/2013, 11:28 AM

## 2013-09-28 ENCOUNTER — Encounter: Payer: Self-pay | Admitting: Family Medicine

## 2013-09-28 ENCOUNTER — Ambulatory Visit (INDEPENDENT_AMBULATORY_CARE_PROVIDER_SITE_OTHER): Payer: Medicare Other | Admitting: Family Medicine

## 2013-09-28 VITALS — BP 173/89 | HR 59 | Temp 98.4°F | Ht 71.0 in

## 2013-09-28 DIAGNOSIS — E8881 Metabolic syndrome: Secondary | ICD-10-CM

## 2013-09-28 DIAGNOSIS — N400 Enlarged prostate without lower urinary tract symptoms: Secondary | ICD-10-CM

## 2013-09-28 DIAGNOSIS — E785 Hyperlipidemia, unspecified: Secondary | ICD-10-CM

## 2013-09-28 DIAGNOSIS — E559 Vitamin D deficiency, unspecified: Secondary | ICD-10-CM

## 2013-09-28 DIAGNOSIS — M2550 Pain in unspecified joint: Secondary | ICD-10-CM

## 2013-09-28 DIAGNOSIS — Z789 Other specified health status: Secondary | ICD-10-CM

## 2013-09-28 DIAGNOSIS — I10 Essential (primary) hypertension: Secondary | ICD-10-CM

## 2013-09-28 DIAGNOSIS — Z889 Allergy status to unspecified drugs, medicaments and biological substances status: Secondary | ICD-10-CM

## 2013-09-28 LAB — POCT GLYCOSYLATED HEMOGLOBIN (HGB A1C): Hemoglobin A1C: 5.4

## 2013-09-28 MED ORDER — EPINEPHRINE 0.3 MG/0.3ML IJ SOAJ: 0.3000 mg | Freq: Once | INTRAMUSCULAR | Status: AC

## 2013-09-28 NOTE — Patient Instructions (Addendum)
Continue current medications. Continue good therapeutic lifestyle changes which include good diet and exercise. Fall precautions discussed with patient. If an FOBT was given today- please return it to our front desk. If you are over 65 years old - you may need Prevnar 13 or the adult Pneumonia vaccine.  Please schedule your colonoscopy with her gastroenterologist Continue followup with the orthopedist return the FOBT Your last PSA in October 2013 was 0.45 Please check blood pressures at home and let us review those readings in a couple of weeks

## 2013-09-28 NOTE — Progress Notes (Signed)
Subjective:    Patient ID: Seth Martinez, male    DOB: [birth year], 63 y.o.   MRN: [id number]  HPI Pt here for follow up and management of chronic medical problems. The patient has a history of staph septicemia. He has had septic arthritis of multiple joints. He has been followed by infectious disease for this for quite a period of time. A recheck on his blood pressure today was 165/88.        Patient Active Problem List   Diagnosis Date Noted  . Metabolic syndrome 17/49/4496  . Multiple drug allergies 09/28/2013  . Nocturia 07/25/2013  . Rash and nonspecific skin eruption 05/15/2013  . Morbid obesity 05/04/2013  . Staphylococcus aureus septicemia 05/04/2013  . Septic arthritis of multiple joints 05/02/2013  . Hyperlipemia 12/28/2012  . Hypertension 12/28/2012  . Vitamin D deficiency 12/28/2012   Outpatient Encounter Prescriptions as of 09/28/2013  Medication Sig  . acetaminophen (TYLENOL) 325 MG tablet Take 650 mg by mouth every 6 (six) hours as needed.  . ramipril (ALTACE) 2.5 MG capsule Take 1 capsule (2.5 mg total) by mouth daily.  . [DISCONTINUED] oxyCODONE-acetaminophen (PERCOCET/ROXICET) 5-325 MG per tablet Take 1 tablet by mouth 3 (three) times daily as needed.    Review of Systems  Constitutional: Negative.   HENT: Negative.   Eyes: Negative.   Respiratory: Negative.   Cardiovascular: Negative.   Gastrointestinal: Negative.   Endocrine: Negative.   Genitourinary: Negative.   Musculoskeletal: Positive for arthralgias (knees and shoulders). Joint swelling: joint pain.  Skin: Negative.   Allergic/Immunologic: Negative.   Neurological: Negative.   Hematological: Negative.   Psychiatric/Behavioral: Negative.        Objective:   Physical Exam  Nursing note and vitals reviewed. Constitutional: He is oriented to person, place, and time. He appears well-developed and well-nourished. No distress.  Pleasant and cooperative and happy to be feeling better here    HENT:  Head: Normocephalic and atraumatic.  Right Ear: External ear normal.  Left Ear: External ear normal.  Nose: Nose normal.  Mouth/Throat: Oropharynx is clear and moist. No oropharyngeal exudate.  Eyes: Conjunctivae and EOM are normal. Pupils are equal, round, and reactive to light. Right eye exhibits no discharge. Left eye exhibits no discharge. No scleral icterus.  Neck: Normal range of motion. Neck supple. No thyromegaly present.  Cardiovascular: Normal rate, regular rhythm, normal heart sounds and intact distal pulses.  Exam reveals no gallop and no friction rub.   No murmur heard. At 60 per minute  Pulmonary/Chest: Effort normal and breath sounds normal. No respiratory distress. He has no wheezes. He has no rales. He exhibits no tenderness.  Abdominal: Soft. Bowel sounds are normal. He exhibits no mass. There is no tenderness. There is no rebound and no guarding.  Obesity  Genitourinary: Rectum normal and penis normal.  The prostate was slightly enlarged with no lumps and no rectal masses. There were no inguinal hernias the external genitalia was normal.  Musculoskeletal: Normal range of motion. He exhibits no edema and no tenderness.  Lymphadenopathy:    He has no cervical adenopathy.  Neurological: He is alert and oriented to person, place, and time. He has normal reflexes. No cranial nerve deficit.  Skin: Skin is warm and dry. No rash noted. No erythema. No pallor.  Psychiatric: He has a normal mood and affect. His behavior is normal. Judgment and thought content normal.   BP 173/89  Pulse 59  Temp(Src) 98.4 F (36.9 C) (Oral)  Ht 5'  11" (1.803 m)        Assessment & Plan:  1. Hyperlipemia - POCT CBC - NMR, lipoprofile  2. Hypertensio- POCT CBC - BMP8+EGFR - Hepatic function panel  3. Vitamin D deficiency - POCT CBC - Vit D  25 hydroxy (rtn osteoporosis monitoring) - Arthritis Panel  4. Metabolic syndrome - POCT CBC - POCT glycosylated hemoglobin (Hb  A1C)  5. Multiple drug allergies - POCT CBC  6. Statin intolerance - POCT CBC  7. Joint pain - Arthritis Panel  8. BPH (benign prostatic hyperplasia) - PSA, total and free  Meds ordered this encounter  Medications  . acetaminophen (TYLENOL) 325 MG tablet    Sig: Take 650 mg by mouth every 6 (six) hours as needed.  Marland Kitchen EPINEPHrine (EPI-PEN) 0.3 mg/0.3 mL SOAJ injection    Sig: Inject 0.3 mLs (0.3 mg total) into the muscle once.    Dispense:  1 Device    Refill:  3     Patient Instructions  Continue current medications. Continue good therapeutic lifestyle changes which include good diet and exercise. Fall precautions discussed with patient. If an FOBT was given today- please return it to our front desk. If you are over 85 years old - you may need Prevnar 37 or the adult Pneumonia vaccine.  Please schedule your colonoscopy with her gastroenterologist Continue followup with the orthopedist return the FOBT Your last PSA in October 2013 was 0.45 Please check blood pressures at home and let us review those readings in a couple of weeks    Arrie Senate MD

## 2013-09-30 LAB — HEPATIC FUNCTION PANEL
ALT: 24 IU/L (ref 0–44)
AST: 34 IU/L (ref 0–40)
Albumin: 4.6 g/dL (ref 3.6–4.8)
Alkaline Phosphatase: 86 IU/L (ref 39–117)
Bilirubin, Direct: 0.09 mg/dL (ref 0.00–0.40)
TOTAL PROTEIN: 6.8 g/dL (ref 6.0–8.5)
Total Bilirubin: 0.3 mg/dL (ref 0.0–1.2)

## 2013-09-30 LAB — BMP8+EGFR
BUN/Creatinine Ratio: 11 (ref 10–22)
BUN: 11 mg/dL (ref 8–27)
CHLORIDE: 103 mmol/L (ref 97–108)
CO2: 20 mmol/L (ref 18–29)
Calcium: 9.6 mg/dL (ref 8.6–10.2)
Creatinine, Ser: 1.02 mg/dL (ref 0.76–1.27)
GFR calc Af Amer: 89 mL/min/{1.73_m2} (ref >59)
GFR, EST NON AFRICAN AMERICAN: 77 mL/min/{1.73_m2} (ref >59)
GLUCOSE: 105 mg/dL — AB (ref 65–99)
Potassium: 4.4 mmol/L (ref 3.5–5.2)
Sodium: 140 mmol/L (ref 134–144)

## 2013-09-30 LAB — ARTHRITIS PANEL
Anti Nuclear Antibody(ANA): NEGATIVE
RHEUMATOID FACTOR: 9.2 [IU]/mL (ref 0.0–13.9)
SED RATE: 12 mm/h (ref 0–30)
Uric Acid: 7.5 mg/dL (ref 3.7–8.6)

## 2013-09-30 LAB — VITAMIN D 25 HYDROXY (VIT D DEFICIENCY, FRACTURES): Vit D, 25-Hydroxy: 26.5 ng/mL — ABNORMAL LOW (ref 30.0–100.0)

## 2013-09-30 LAB — PSA, TOTAL AND FREE
PSA FREE PCT: 52.5 %
PSA FREE: 0.21 ng/mL
PSA: 0.4 ng/mL (ref 0.0–4.0)

## 2013-09-30 LAB — NMR, LIPOPROFILE
Cholesterol: 182 mg/dL (ref <200)
HDL Cholesterol by NMR: 43 mg/dL (ref >=40)
HDL PARTICLE NUMBER: 32.1 umol/L (ref >=30.5)
LDL Particle Number: 1320 nmol/L — ABNORMAL HIGH (ref <1000)
LDL Size: 20.5 nm — ABNORMAL LOW (ref >20.5)
LDLC SERPL CALC-MCNC: 89 mg/dL (ref <100)
LP-IR SCORE: 85 — AB (ref <=45)
SMALL LDL PARTICLE NUMBER: 581 nmol/L — AB (ref <=527)
Triglycerides by NMR: 249 mg/dL — ABNORMAL HIGH (ref <150)

## 2013-10-06 MED ORDER — VITAMIN D (ERGOCALCIFEROL) 1.25 MG (50000 UNIT) PO CAPS: 50000.0000 [IU] | ORAL_CAPSULE | ORAL | Status: DC

## 2013-10-06 NOTE — Addendum Note (Signed)
Addended by: Magdalene RiverBULLINS, JAMIE H on: 10/06/2013 10:12 AM   Modules accepted: Orders

## 2014-02-06 ENCOUNTER — Ambulatory Visit (INDEPENDENT_AMBULATORY_CARE_PROVIDER_SITE_OTHER): Payer: Medicare Other

## 2014-02-06 ENCOUNTER — Encounter: Payer: Self-pay | Admitting: Family Medicine

## 2014-02-06 ENCOUNTER — Ambulatory Visit (INDEPENDENT_AMBULATORY_CARE_PROVIDER_SITE_OTHER): Payer: Medicare Other | Admitting: Family Medicine

## 2014-02-06 VITALS — BP 149/87 | HR 63 | Temp 97.8°F | Ht 71.0 in | Wt 313.0 lb

## 2014-02-06 DIAGNOSIS — M545 Low back pain, unspecified: Secondary | ICD-10-CM

## 2014-02-06 DIAGNOSIS — M79671 Pain in right foot: Secondary | ICD-10-CM

## 2014-02-06 DIAGNOSIS — M79609 Pain in unspecified limb: Secondary | ICD-10-CM

## 2014-02-06 DIAGNOSIS — E8881 Metabolic syndrome: Secondary | ICD-10-CM

## 2014-02-06 DIAGNOSIS — E559 Vitamin D deficiency, unspecified: Secondary | ICD-10-CM

## 2014-02-06 DIAGNOSIS — I10 Essential (primary) hypertension: Secondary | ICD-10-CM

## 2014-02-06 DIAGNOSIS — E785 Hyperlipidemia, unspecified: Secondary | ICD-10-CM

## 2014-02-06 LAB — POCT CBC
GRANULOCYTE PERCENT: 75.2 % (ref 37–80)
HCT, POC: 48.2 % (ref 43.5–53.7)
Hemoglobin: 16.5 g/dL (ref 14.1–18.1)
Lymph, poc: 1.4 (ref 0.6–3.4)
MCH, POC: 30 pg (ref 27–31.2)
MCHC: 34.2 g/dL (ref 31.8–35.4)
MCV: 87.8 fL (ref 80–97)
MPV: 6.9 fL (ref 0–99.8)
POC Granulocyte: 4.7 (ref 2–6.9)
POC LYMPH PERCENT: 23.3 %L (ref 10–50)
Platelet Count, POC: 237 10*3/uL (ref 142–424)
RBC: 5.5 M/uL (ref 4.69–6.13)
RDW, POC: 13.1 %
WBC: 6.2 10*3/uL (ref 4.6–10.2)

## 2014-02-06 LAB — POCT GLYCOSYLATED HEMOGLOBIN (HGB A1C): Hemoglobin A1C: 5.5

## 2014-02-06 MED ORDER — CYCLOBENZAPRINE HCL 10 MG PO TABS: 10.0000 mg | ORAL_TABLET | Freq: Three times a day (TID) | ORAL | Status: DC | PRN

## 2014-02-06 MED ORDER — VITAMIN D (ERGOCALCIFEROL) 1.25 MG (50000 UNIT) PO CAPS: 50000.0000 [IU] | ORAL_CAPSULE | ORAL | Status: DC

## 2014-02-06 MED ORDER — HYDROCODONE-ACETAMINOPHEN 5-325 MG PO TABS: 1.0000 | ORAL_TABLET | Freq: Three times a day (TID) | ORAL | Status: DC | PRN

## 2014-02-06 MED ORDER — OXYCODONE-ACETAMINOPHEN 5-325 MG PO TABS: 1.0000 | ORAL_TABLET | Freq: Three times a day (TID) | ORAL | Status: DC | PRN

## 2014-02-06 NOTE — Progress Notes (Signed)
Subjective:    Patient ID: Seth Martinez, male    DOB: 1950-10-12, 63 y.o.   MRN: [id number]  HPI Patient here today for low back pain that radiates down the right leg. The patient attributes his back and right hip and knee pain to problems with his feet. His x-rays today probably been done and it indicates severe degenerative changes and previous surgery with screws to stabilize his back. The patient indicates that his foot pain started 3-4 weeks ago suddenly. Sometime after this he started having more trouble with his right back with pain radiating down to his hip and knee and leg. He describes no injury. He is due for an office visit the end of this month. He needs refills on his hydrocodone and Flexeril.      Patient Active Problem List   Diagnosis Date Noted  . Metabolic syndrome 30/01/6225  . Multiple drug allergies 09/28/2013  . Statin intolerance 09/28/2013  . Nocturia 07/25/2013  . Rash and nonspecific skin eruption 05/15/2013  . Morbid obesity 05/04/2013  . Staphylococcus aureus septicemia, history of 05/04/2013  . Septic arthritis of multiple joints, history of 05/02/2013  . Hyperlipemia 12/28/2012  . Hypertension 12/28/2012  . Vitamin D deficiency 12/28/2012   Outpatient Encounter Prescriptions as of 02/06/2014  Medication Sig  . acetaminophen (TYLENOL) 325 MG tablet Take 650 mg by mouth every 6 (six) hours as needed.  Marland Kitchen EPINEPHrine (EPI-PEN) 0.3 mg/0.3 mL SOAJ injection Inject 0.3 mLs (0.3 mg total) into the muscle once.  Marland Kitchen HYDROcodone-acetaminophen (NORCO/VICODIN) 5-325 MG per tablet Take 1 tablet by mouth every 6 (six) hours as needed for moderate pain.  . ramipril (ALTACE) 2.5 MG capsule Take 1 capsule (2.5 mg total) by mouth daily.  . [DISCONTINUED] Vitamin D, Ergocalciferol, (DRISDOL) 50000 UNITS CAPS capsule Take 1 capsule (50,000 Units total) by mouth every 7 (seven) days.    Review of Systems  Constitutional: Negative.   HENT: Negative.   Eyes:  Negative.   Respiratory: Negative.   Cardiovascular: Negative.   Gastrointestinal: Negative.   Endocrine: Negative.   Genitourinary: Negative.   Musculoskeletal: Positive for arthralgias (right foot pain) and back pain (low back and right leg).  Skin: Negative.   Allergic/Immunologic: Negative.   Neurological: Negative.   Hematological: Negative.   Psychiatric/Behavioral: Negative.        Objective:   Physical Exam  Nursing note and vitals reviewed. Constitutional: He is oriented to person, place, and time. He appears well-developed and well-nourished. No distress.  HENT:  Head: Normocephalic and atraumatic.  Eyes: Conjunctivae and EOM are normal. Pupils are equal, round, and reactive to light. Right eye exhibits no discharge. Left eye exhibits no discharge. No scleral icterus.  Neck: Normal range of motion.  Cardiovascular: Intact distal pulses.   Pulmonary/Chest: No respiratory distress.  Abdominal:  Severe obesity  Genitourinary: Rectum normal.  Musculoskeletal: He exhibits tenderness (the tenderness is in the right lobe  The vertebral spine). He exhibits no edema.  Range of motion is limited to supine and raising back up and standing due to pain in back and foot. The plantar surface of the right heel was also tender to palpation  Lymphadenopathy:    He has no cervical adenopathy.  Neurological: He is alert and oriented to person, place, and time. He has normal reflexes. No cranial nerve deficit.  Skin: No rash noted. No erythema. No pallor.  Extremities including the knees are cool and clammy to palpation  Psychiatric: He has a normal mood  and affect. His behavior is normal. Judgment and thought content normal.   BP 152/80  Pulse 64  Temp(Src) 97.8 F (36.6 C) (Oral)  Ht '5\' 11"'  (1.803 m)  Wt 313 lb (141.976 kg)  BMI 43.67 kg/m2    [facility] reading (PRIMARY) by  DrMoore-LS-spine--severe degenerative changes, one broken screw from previous surgery  , right foot and  heel--no obvious spur formation                                Assessment & Plan:  1. Right low back pain, with sciatica presence unspecified - DG Lumbar Spine 2-3 Views; Future - POCT CBC - Uric acid - Sedimentation rate  2. Right foot pain - DG Foot Complete Right; Future - POCT CBC - Uric acid - Sedimentation rate  3. Hyperlipemia - POCT CBC - NMR, lipoprofile  4. Essential hypertension - POCT CBC - BMP8+EGFR - Hepatic function panel  5. Vitamin D deficiency - POCT CBC - Vit D  25 hydroxy (rtn osteoporosis monitoring)  6. Metabolic syndrome - POCT CBC - POCT glycosylated hemoglobin (Hb A1C) Meds ordered this encounter  Medications  . DISCONTD: HYDROcodone-acetaminophen (NORCO/VICODIN) 5-325 MG per tablet    Sig: Take 1 tablet by mouth every 6 (six) hours as needed for moderate pain.  Marland Kitchen HYDROcodone-acetaminophen (NORCO/VICODIN) 5-325 MG per tablet    Sig: Take 1 tablet by mouth every 8 (eight) hours as needed for moderate pain.    Dispense:  30 tablet    Refill:  0  . cyclobenzaprine (FLEXERIL) 10 MG tablet    Sig: Take 1 tablet (10 mg total) by mouth 3 (three) times daily as needed for muscle spasms.    Dispense:  30 tablet    Refill:  0   Patient Instructions  Take Aleve 1 after breakfast and supper. If any indication of swelling shortness of breath or GI irritation he should stop this immediately Continue to take hydrocodone one every 8-12 hours if needed for severe pain Take Flexeril 10 if needed for muscle relaxation Avoid lifting pushing pulling Rest as much as possible We will call you with the results of the x-rays once those results are available, if the pain continues and the conservative measures do not help we will most likely schedule you to see the neurosurgeon again   Seth Senate MD

## 2014-02-06 NOTE — Patient Instructions (Signed)
Take Aleve 1 after breakfast and supper. If any indication of swelling shortness of breath or GI irritation he should stop this immediately Continue to take hydrocodone one every 8-12 hours if needed for severe pain Take Flexeril 10 if needed for muscle relaxation Avoid lifting pushing pulling Rest as much as possible We will call you with the results of the x-rays once those results are available, if the pain continues and the conservative measures do not help we will most likely schedule you to see the neurosurgeon again

## 2014-02-07 ENCOUNTER — Telehealth: Payer: Self-pay

## 2014-02-07 ENCOUNTER — Telehealth: Payer: Self-pay | Admitting: Family Medicine

## 2014-02-07 DIAGNOSIS — M5442 Lumbago with sciatica, left side: Secondary | ICD-10-CM

## 2014-02-07 DIAGNOSIS — M5441 Lumbago with sciatica, right side: Secondary | ICD-10-CM

## 2014-02-07 LAB — SEDIMENTATION RATE: Sed Rate: 9 mm/hr (ref 0–30)

## 2014-02-07 LAB — NMR, LIPOPROFILE
CHOLESTEROL: 162 mg/dL (ref 100–199)
HDL Cholesterol by NMR: 39 mg/dL — ABNORMAL LOW (ref >39)
HDL Particle Number: 34.8 umol/L (ref >=30.5)
LDL Particle Number: 1121 nmol/L — ABNORMAL HIGH (ref <1000)
LDL SIZE: 20.2 nm (ref >20.5)
LDLC SERPL CALC-MCNC: 84 mg/dL (ref 0–99)
LP-IR SCORE: 82 — AB (ref <=45)
Small LDL Particle Number: 675 nmol/L — ABNORMAL HIGH (ref <=527)
Triglycerides by NMR: 195 mg/dL — ABNORMAL HIGH (ref 0–149)

## 2014-02-07 LAB — HEPATIC FUNCTION PANEL
ALBUMIN: 4.6 g/dL (ref 3.6–4.8)
ALK PHOS: 72 IU/L (ref 39–117)
ALT: 31 IU/L (ref 0–44)
AST: 36 IU/L (ref 0–40)
BILIRUBIN DIRECT: 0.15 mg/dL (ref 0.00–0.40)
TOTAL PROTEIN: 7 g/dL (ref 6.0–8.5)
Total Bilirubin: 0.7 mg/dL (ref 0.0–1.2)

## 2014-02-07 LAB — VITAMIN D 25 HYDROXY (VIT D DEFICIENCY, FRACTURES): Vit D, 25-Hydroxy: 30.9 ng/mL (ref 30.0–100.0)

## 2014-02-07 LAB — BMP8+EGFR
BUN/Creatinine Ratio: 10 (ref 10–22)
BUN: 11 mg/dL (ref 8–27)
CO2: 20 mmol/L (ref 18–29)
Calcium: 9.6 mg/dL (ref 8.6–10.2)
Chloride: 100 mmol/L (ref 97–108)
Creatinine, Ser: 1.09 mg/dL (ref 0.76–1.27)
GFR, EST AFRICAN AMERICAN: 82 mL/min/{1.73_m2} (ref >59)
GFR, EST NON AFRICAN AMERICAN: 71 mL/min/{1.73_m2} (ref >59)
GLUCOSE: 103 mg/dL — AB (ref 65–99)
Potassium: 4.4 mmol/L (ref 3.5–5.2)
Sodium: 140 mmol/L (ref 134–144)

## 2014-02-07 LAB — URIC ACID: Uric Acid: 7.9 mg/dL (ref 3.7–8.6)

## 2014-02-07 NOTE — Telephone Encounter (Signed)
Message copied by Roselee CulverHUMLEY, Kailer Heindel on Wed Feb 07, 2014 11:05 AM ------      Message from: Ernestina PennaMOORE, DONALD W      Created: Tue Feb 06, 2014  5:20 PM       As per radiology report ------

## 2014-02-07 NOTE — Telephone Encounter (Signed)
Pt aware of lumbar spine and foot x-rays 

## 2014-02-07 NOTE — Telephone Encounter (Signed)
Pt aware of lumbar spine and foot x-rays

## 2014-02-07 NOTE — Telephone Encounter (Signed)
Message copied by Roselee CulverHUMLEY, Issaiah Seabrooks on Wed Feb 07, 2014 11:05 AM ------      Message from: Ernestina PennaMOORE, DONALD W      Created: Tue Feb 06, 2014  5:23 PM       As per radiology report----take medication as directed. If no improvement because of the SI joint problems, we may have patient see orthopedist for an injection in this area ------

## 2014-02-08 NOTE — Telephone Encounter (Signed)
Discussed with patient and referral placed to Dr. Danielle DessElsner.  Patient aware that it may take up to a week to hear back about appt.

## 2014-02-08 NOTE — Telephone Encounter (Signed)
Discussed results with patient.  He continues to have back pain that radiates into his hip and leg. What should he do at this point?

## 2014-02-08 NOTE — Telephone Encounter (Signed)
Results have been discussed.  He continues to have back pain with radiculopathy. Do you want to proceed with ortho referral or give meds a little more time?

## 2014-02-08 NOTE — Telephone Encounter (Signed)
I will go head and get an appointment with his previous surgeon and I thought was a neurosurgeon and not an orthopedic surgeon. They may consider doing an ejection in his back. He should get back to the person who did the previous surgery.

## 2014-02-08 NOTE — Telephone Encounter (Signed)
The sedimentation rate was normal and a CBC was normal. All of his labs have been reviewed and they should be available to let the patient know what they are.

## 2014-02-15 ENCOUNTER — Ambulatory Visit (INDEPENDENT_AMBULATORY_CARE_PROVIDER_SITE_OTHER): Payer: Medicare Other

## 2014-02-15 ENCOUNTER — Encounter: Payer: Self-pay | Admitting: Family Medicine

## 2014-02-15 ENCOUNTER — Ambulatory Visit (INDEPENDENT_AMBULATORY_CARE_PROVIDER_SITE_OTHER): Payer: Medicare Other | Admitting: Family Medicine

## 2014-02-15 VITALS — BP 167/94 | HR 75 | Temp 98.6°F | Ht 71.0 in | Wt 314.0 lb

## 2014-02-15 DIAGNOSIS — M545 Low back pain, unspecified: Secondary | ICD-10-CM

## 2014-02-15 DIAGNOSIS — M25561 Pain in right knee: Secondary | ICD-10-CM

## 2014-02-15 DIAGNOSIS — E559 Vitamin D deficiency, unspecified: Secondary | ICD-10-CM

## 2014-02-15 DIAGNOSIS — M25569 Pain in unspecified knee: Secondary | ICD-10-CM

## 2014-02-15 DIAGNOSIS — I10 Essential (primary) hypertension: Secondary | ICD-10-CM

## 2014-02-15 DIAGNOSIS — E785 Hyperlipidemia, unspecified: Secondary | ICD-10-CM

## 2014-02-15 DIAGNOSIS — E8881 Metabolic syndrome: Secondary | ICD-10-CM

## 2014-02-15 LAB — POCT UA - MICROALBUMIN: Microalbumin Ur, POC: 50 mg/L

## 2014-02-15 NOTE — Patient Instructions (Addendum)
Continue current medications. Continue good therapeutic lifestyle changes which include good diet and exercise. Fall precautions discussed with patient. If an FOBT was given today- please return it to our front desk. If you are over 65 years old - you may need Prevnar 13 or the adult Pneumonia vaccine.  Patient will try taking ibuprofen 200 mg ,2, 3 times daily after eating he will discontinue Aleve He will monitor his blood pressure at home Continue to watch sodium intake We will arrange for him to see the orthopedist regarding his knee problem and joint pain in the right knee He will still keep his appointment with Dr. Danielle DessElsner regarding his back

## 2014-02-15 NOTE — Progress Notes (Signed)
Subjective:    Patient ID: Seth Martinez, male    DOB: 02-25-1951, 63 y.o.   MRN: [id number]  HPI Pt here for follow up and management of chronic medical problems. He is also following up today on the right side back pain. This issue is not any better. Recent x-rays and lab work were reviewed with the patient. The x-ray report confirmed the dextroscoliosis and broken screw at S1. There is degenerative disc narrowing at L2-3 L3-L4 and large osteophytes anteriorly and to the right at L2 and 3 there is sclerosis of the right SI joint. Lab work overall was stable except for elevated triglycerides. Blood sugar hemoglobin A1c and CBC were good. Also considering the patient's recent history with sepsis his sedimentation rate was 9.         Patient Active Problem List   Diagnosis Date Noted  . Metabolic syndrome 09/28/2013  . Multiple drug allergies 09/28/2013  . Statin intolerance 09/28/2013  . Nocturia 07/25/2013  . Rash and nonspecific skin eruption 05/15/2013  . Morbid obesity 05/04/2013  . Staphylococcus aureus septicemia, history of 05/04/2013  . Septic arthritis of multiple joints, history of 05/02/2013  . Hyperlipemia 12/28/2012  . Hypertension 12/28/2012  . Vitamin D deficiency 12/28/2012   Outpatient Encounter Prescriptions as of 02/15/2014  Medication Sig  . acetaminophen (TYLENOL) 325 MG tablet Take 650 mg by mouth every 6 (six) hours as needed.  . cyclobenzaprine (FLEXERIL) 10 MG tablet Take 1 tablet (10 mg total) by mouth 3 (three) times daily as needed for muscle spasms.  Marland Kitchen EPINEPHrine (EPI-PEN) 0.3 mg/0.3 mL SOAJ injection Inject 0.3 mLs (0.3 mg total) into the muscle once.  . Naproxen Sodium 220 MG CAPS Take 2 capsules by mouth 2 (two) times daily as needed.  Marland Kitchen oxyCODONE-acetaminophen (ROXICET) 5-325 MG per tablet Take 1 tablet by mouth every 8 (eight) hours as needed for severe pain.  . ramipril (ALTACE) 2.5 MG capsule Take 1 capsule (2.5 mg total) by mouth daily.    . Vitamin D, Ergocalciferol, (DRISDOL) 50000 UNITS CAPS capsule Take 1 capsule (50,000 Units total) by mouth every 7 (seven) days.    Review of Systems  Constitutional: Negative.   HENT: Negative.   Eyes: Negative.   Respiratory: Negative.   Cardiovascular: Negative.   Gastrointestinal: Negative.   Endocrine: Negative.   Genitourinary: Negative.   Musculoskeletal: Positive for back pain (low right side and pain into right thigh).  Skin: Negative.   Allergic/Immunologic: Negative.   Neurological: Negative.   Hematological: Negative.   Psychiatric/Behavioral: Negative.        Objective:   Physical Exam  Nursing note and vitals reviewed. Constitutional: He is oriented to person, place, and time. He appears well-developed and well-nourished. He appears distressed.  HENT:  Head: Normocephalic and atraumatic.  Eyes: Conjunctivae and EOM are normal. Pupils are equal, round, and reactive to light. Right eye exhibits no discharge. Left eye exhibits no discharge. No scleral icterus.  Neck: Normal range of motion.  Abdominal:  Obesity  Genitourinary:  No hernia palpated, testicles normal  Musculoskeletal: Normal range of motion. He exhibits tenderness. He exhibits no edema.  The right knee had tenderness at the medial and lateral joint lines.  Neurological: He is alert and oriented to person, place, and time.  Skin: Skin is dry. No rash noted.  Lower extremities clammy and moist  Psychiatric: He has a normal mood and affect. His behavior is normal. Judgment and thought content normal.   BP 160/98  Pulse 75  Temp(Src) 98.6 F (37 C) (Oral)  Ht 5\' 11"  (1.803 m)  Wt 314 lb (142.429 kg)  BMI 43.81 kg/m2  [facility] reading (PRIMARY) by  DrMoore-right knee--Degenerative changes                                        Assessment & Plan:  1. Hyperlipemia  2. Essential hypertension - POCT UA - Microalbumin  3. Metabolic syndrome - POCT UA - Microalbumin  4. Vitamin D  deficiency  5. Right knee pain - Ambulatory referral to Orthopedic Surgery - DG Knee 1-2 Views Right; Future  6. Right low back pain, with sciatica presence unspecified  Patient Instructions  Continue current medications. Continue good therapeutic lifestyle changes which include good diet and exercise. Fall precautions discussed with patient. If an FOBT was given today- please return it to our front desk. If you are over 478 years old - you may need Prevnar 13 or the adult Pneumonia vaccine.  Patient will try taking ibuprofen 200 mg ,2, 3 times daily after eating he will discontinue Aleve He will monitor his blood pressure at home Continue to watch sodium intake We will arrange for him to see the orthopedist regarding his knee problem and joint pain in the right knee He will still keep his appointment with Dr. Danielle DessElsner regarding his back   Nyra Capeson W. Moore MD

## 2014-02-16 LAB — MICROALBUMIN, URINE: Microalbumin, Urine: 300.5 ug/mL — ABNORMAL HIGH (ref 0.0–17.0)

## 2014-03-24 ENCOUNTER — Ambulatory Visit (INDEPENDENT_AMBULATORY_CARE_PROVIDER_SITE_OTHER): Payer: Medicare Other | Admitting: Nurse Practitioner

## 2014-03-24 ENCOUNTER — Encounter: Payer: Self-pay | Admitting: Nurse Practitioner

## 2014-03-24 VITALS — BP 141/94 | HR 73 | Temp 98.7°F | Ht 71.0 in | Wt 315.0 lb

## 2014-03-24 DIAGNOSIS — J069 Acute upper respiratory infection, unspecified: Secondary | ICD-10-CM

## 2014-03-24 MED ORDER — AZITHROMYCIN 250 MG PO TABS: ORAL_TABLET | ORAL | Status: DC

## 2014-03-24 NOTE — Progress Notes (Signed)
   Subjective:    Patient ID: Seth Martinez, male    DOB: 01-23-1951, 63 y.o.   MRN: [id number]  HPI Patient in today c/o cough and congestion that started Monday- low grade fever earlier in the week. Delsym OTC    Review of Systems  Constitutional: Positive for fatigue. Negative for fever and chills.  HENT: Positive for congestion, rhinorrhea, sinus pressure and sore throat. Negative for trouble swallowing and voice change.   Respiratory: Positive for cough (nonproductive).   Cardiovascular: Negative.   Gastrointestinal: Negative.   Neurological: Negative.   Psychiatric/Behavioral: Negative.   All other systems reviewed and are negative.      Objective:   Physical Exam  Constitutional: He is oriented to person, place, and time. He appears well-developed and well-nourished. No distress.  HENT:  Right Ear: Hearing, tympanic membrane, external ear and ear canal normal.  Left Ear: Hearing, tympanic membrane, external ear and ear canal normal.  Nose: Mucosal edema and rhinorrhea present. Right sinus exhibits maxillary sinus tenderness. Right sinus exhibits no frontal sinus tenderness. Left sinus exhibits maxillary sinus tenderness. Left sinus exhibits no frontal sinus tenderness.  Mouth/Throat: Uvula is midline, oropharynx is clear and moist and mucous membranes are normal.  Eyes: Pupils are equal, round, and reactive to light.  Neck: Normal range of motion. Neck supple.  Cardiovascular: Normal rate, regular rhythm and normal heart sounds.   Pulmonary/Chest: Effort normal and breath sounds normal.  Lymphadenopathy:    He has no cervical adenopathy.  Neurological: He is alert and oriented to person, place, and time.  Skin: Skin is warm.  Psychiatric: He has a normal mood and affect. His behavior is normal. Judgment and thought content normal.   BP 141/94  Pulse 73  Temp(Src) 98.7 F (37.1 C) (Oral)  Ht  (1.803 m)  Wt 315 lb (142.883 kg)  BMI 43.95 kg/m2          Assessment & Plan:   1. Upper respiratory infection with cough and congestion    Meds ordered this encounter  Medications  . azithromycin (ZITHROMAX Z-PAK) 250 MG tablet    Sig: As directed    Dispense:  6 each    Refill:  0    Order Specific Question:  Supervising Provider    Answer:  Ernestina Penna [[id number]]   1. Take meds as prescribed 2. Use a cool mist humidifier especially during the winter months and when heat has been humid. 3. Use saline nose sprays frequently 4. Saline irrigations of the nose can be very helpful if done frequently.  * 4X daily for 1 week*  * Use of a nettie pot can be helpful with this. Follow directions with this* 5. Drink plenty of fluids 6. Keep thermostat turn down low 7.For any cough or congestion  Use plain Mucinex- regular strength or max strength is fine   * Children- consult with Pharmacist for dosing 8. For fever or aces or pains- take tylenol or ibuprofen appropriate for age and weight.  * for fevers greater than 101 orally you may alternate ibuprofen and tylenol every  3 hours.   Mary-Margaret Daphine Deutscher, FNP

## 2014-03-24 NOTE — Patient Instructions (Signed)

## 2014-03-28 ENCOUNTER — Ambulatory Visit (INDEPENDENT_AMBULATORY_CARE_PROVIDER_SITE_OTHER): Payer: Medicare Other | Admitting: Family Medicine

## 2014-03-28 ENCOUNTER — Encounter: Payer: Self-pay | Admitting: Family Medicine

## 2014-03-28 VITALS — BP 147/99 | HR 109 | Temp 97.1°F | Ht 71.0 in | Wt 313.0 lb

## 2014-03-28 DIAGNOSIS — L500 Allergic urticaria: Secondary | ICD-10-CM

## 2014-03-28 MED ORDER — METHYLPREDNISOLONE (PAK) 4 MG PO TABS: ORAL_TABLET | ORAL | Status: DC

## 2014-03-28 MED ORDER — METHYLPREDNISOLONE ACETATE 80 MG/ML IJ SUSP
80.0000 mg | Freq: Once | INTRAMUSCULAR | Status: AC
  Administered 2014-03-28: 80 mg via INTRAMUSCULAR

## 2014-03-28 NOTE — Progress Notes (Signed)
   Subjective:    Patient ID: Seth Martinez, male    DOB: 1951/06/02, 63 y.o.   MRN: [id number]  HPI This 63 y.o. male presents for evaluation of having rash and hives.  He is having rash on his lower extremities.   Review of Systems C/o rash No chest pain, SOB, HA, dizziness, vision change, N/V, diarrhea, constipation, dysuria, urinary urgency or frequency, myalgias, arthralgias.     Objective:   Physical Exam Vital signs noted  Well developed well nourished male.  HEENT - Head atraumatic Normocephalic Respiratory - Lungs CTA bilateral Cardiac - RRR S1 and S2 without murmur GI - Abdomen soft Nontender and bowel sounds active x 4 Extremities - No edema. Neuro - Grossly intact. Skin -  urticaria rash to abdomen, legs, and legs      Assessment & Plan:  Allergic urticaria - Plan: methylPREDNISolone acetate (DEPO-MEDROL) injection 80 mg, methylPREDNIsolone (MEDROL DOSPACK) 4 MG tablet, DISCONTINUED: methylPREDNIsolone (MEDROL DOSPACK) 4 MG tablet  Deatra Canter FNP

## 2014-03-30 ENCOUNTER — Other Ambulatory Visit: Payer: Self-pay | Admitting: Neurological Surgery

## 2014-03-30 ENCOUNTER — Other Ambulatory Visit (HOSPITAL_COMMUNITY): Payer: Self-pay | Admitting: Neurological Surgery

## 2014-03-30 DIAGNOSIS — M4120 Other idiopathic scoliosis, site unspecified: Secondary | ICD-10-CM

## 2014-04-10 ENCOUNTER — Telehealth: Payer: Self-pay | Admitting: Family Medicine

## 2014-04-10 NOTE — Telephone Encounter (Signed)
Please refill this medication x1

## 2014-04-11 MED ORDER — OXYCODONE-ACETAMINOPHEN 5-325 MG PO TABS: 1.0000 | ORAL_TABLET | Freq: Three times a day (TID) | ORAL | Status: DC | PRN

## 2014-05-10 ENCOUNTER — Ambulatory Visit (HOSPITAL_COMMUNITY): Payer: Medicare Other

## 2014-06-07 ENCOUNTER — Ambulatory Visit: Payer: Medicare Other | Admitting: Family Medicine

## 2014-07-07 ENCOUNTER — Ambulatory Visit (INDEPENDENT_AMBULATORY_CARE_PROVIDER_SITE_OTHER): Payer: Medicare Other | Admitting: Family Medicine

## 2014-07-07 VITALS — BP 142/88 | HR 88 | Temp 97.5°F | Ht 71.0 in | Wt 325.0 lb

## 2014-07-07 DIAGNOSIS — J069 Acute upper respiratory infection, unspecified: Secondary | ICD-10-CM

## 2014-07-07 MED ORDER — AZITHROMYCIN 250 MG PO TABS: ORAL_TABLET | ORAL | Status: DC

## 2014-07-07 NOTE — Progress Notes (Signed)
   Subjective:    Patient ID: Seth Martinez, male    DOB: 02/01/1951, 63 y.o.   MRN: [id number]  HPI Patient is here for uri sx's.  Review of Systems  Constitutional: Negative for fever.  HENT: Negative for ear pain.   Eyes: Negative for discharge.  Respiratory: Negative for cough.   Cardiovascular: Negative for chest pain.  Gastrointestinal: Negative for abdominal distention.  Endocrine: Negative for polyuria.  Genitourinary: Negative for difficulty urinating.  Musculoskeletal: Negative for gait problem and neck pain.  Skin: Negative for color change and rash.  Neurological: Negative for speech difficulty and headaches.  Psychiatric/Behavioral: Negative for agitation.       Objective:    BP 142/88 mmHg  Pulse 88  Temp(Src) 97.5 F (36.4 C) (Oral)  Ht 5\' 11"  (1.803 m)  Wt 325 lb (147.419 kg)  BMI 45.35 kg/m2 Physical Exam  Constitutional: He is oriented to person, place, and time. He appears well-developed and well-nourished.  HENT:  Head: Normocephalic and atraumatic.  Mouth/Throat: Oropharynx is clear and moist.  Eyes: Pupils are equal, round, and reactive to light.  Neck: Normal range of motion. Neck supple.  Cardiovascular: Normal rate and regular rhythm.   No murmur heard. Pulmonary/Chest: Effort normal and breath sounds normal.  Abdominal: Soft. Bowel sounds are normal. There is no tenderness.  Neurological: He is alert and oriented to person, place, and time.  Skin: Skin is warm and dry.  Psychiatric: He has a normal mood and affect.          Assessment & Plan:     ICD-9-CM ICD-10-CM   1. URI (upper respiratory infection) 465.9 J06.9 azithromycin (ZITHROMAX) 250 MG tablet   Push po fluids, rest, tylenol and motrin otc prn as directed for fever, arthralgias, and myalgias.  Follow up prn if sx's continue or persist.  No Follow-up on file.  Deatra CanterWilliam J Stevon Gough FNP

## 2014-08-08 ENCOUNTER — Ambulatory Visit (INDEPENDENT_AMBULATORY_CARE_PROVIDER_SITE_OTHER): Payer: Medicare Other | Admitting: Family Medicine

## 2014-08-08 ENCOUNTER — Encounter: Payer: Self-pay | Admitting: Family Medicine

## 2014-08-08 VITALS — BP 138/80 | HR 68 | Temp 97.6°F | Ht 71.0 in | Wt 326.0 lb

## 2014-08-08 DIAGNOSIS — E119 Type 2 diabetes mellitus without complications: Secondary | ICD-10-CM

## 2014-08-08 DIAGNOSIS — Z9114 Patient's other noncompliance with medication regimen: Secondary | ICD-10-CM

## 2014-08-08 DIAGNOSIS — M79671 Pain in right foot: Secondary | ICD-10-CM | POA: Diagnosis not present

## 2014-08-08 DIAGNOSIS — R700 Elevated erythrocyte sedimentation rate: Secondary | ICD-10-CM

## 2014-08-08 DIAGNOSIS — E559 Vitamin D deficiency, unspecified: Secondary | ICD-10-CM | POA: Diagnosis not present

## 2014-08-08 DIAGNOSIS — M79672 Pain in left foot: Secondary | ICD-10-CM | POA: Diagnosis not present

## 2014-08-08 DIAGNOSIS — N400 Enlarged prostate without lower urinary tract symptoms: Secondary | ICD-10-CM

## 2014-08-08 DIAGNOSIS — I10 Essential (primary) hypertension: Secondary | ICD-10-CM | POA: Diagnosis not present

## 2014-08-08 DIAGNOSIS — E785 Hyperlipidemia, unspecified: Secondary | ICD-10-CM

## 2014-08-08 DIAGNOSIS — M79609 Pain in unspecified limb: Secondary | ICD-10-CM | POA: Diagnosis not present

## 2014-08-08 LAB — POCT GLYCOSYLATED HEMOGLOBIN (HGB A1C): Hemoglobin A1C: 5.4

## 2014-08-08 MED ORDER — RAMIPRIL 2.5 MG PO CAPS: 2.5000 mg | ORAL_CAPSULE | Freq: Every day | ORAL | Status: DC

## 2014-08-08 NOTE — Progress Notes (Signed)
Subjective:    Patient ID: Seth Martinez, male    DOB: 1951-01-08, 64 y.o.   MRN: [id number]  HPI Pt here for follow up and management of chronic medical problems which include hypertension, hyperlipidemia, and diabetes. He states he has not been taking his medication and vitamins like he should. He complains of pain in his feet with the right foot being worse than the left. He is due to return an FOBT and he will get lab work which is past due today. He refuses the flu vaccine and the Prevnar vaccine. We will refill his Ramipril. The patient admits to noncompliance with his blood pressure medication. He is today of foot pain which is worse in the morning and gets better with movement. He also gives me history that since he was here the last time that he has seen the orthopedist that took care of his MRSA infections and back position ended up sending him back to the neurosurgeon to determine if his back was causing the problems. This was Dr. Ellene Route. He subsequently indicated he had an MRI and he has seeded beer degenerative disc disease and scoliosis and that to fix this he would have to have more surgery on his back. The patient and the surgeon did not want to do any more surgery because of his recent MRSA infection. So for the time being he is just having to learn to live with his back pain.        Patient Active Problem List   Diagnosis Date Noted  . Metabolic syndrome 94/50/3888  . Multiple drug allergies 09/28/2013  . Statin intolerance 09/28/2013  . Nocturia 07/25/2013  . Rash and nonspecific skin eruption 05/15/2013  . Morbid obesity 05/04/2013  . Staphylococcus aureus septicemia, history of 05/04/2013  . Septic arthritis of multiple joints, history of 05/02/2013  . Hyperlipemia 12/28/2012  . Hypertension 12/28/2012  . Vitamin D deficiency 12/28/2012   Outpatient Encounter Prescriptions as of 08/08/2014  Medication Sig  . acetaminophen (TYLENOL) 325 MG tablet Take 650 mg by  mouth every 6 (six) hours as needed.  . cyclobenzaprine (FLEXERIL) 10 MG tablet Take 1 tablet (10 mg total) by mouth 3 (three) times daily as needed for muscle spasms.  Marland Kitchen EPINEPHrine (EPI-PEN) 0.3 mg/0.3 mL SOAJ injection Inject 0.3 mLs (0.3 mg total) into the muscle once.  . Naproxen Sodium 220 MG CAPS Take 2 capsules by mouth 2 (two) times daily as needed.  Marland Kitchen oxyCODONE-acetaminophen (ROXICET) 5-325 MG per tablet Take 1 tablet by mouth every 8 (eight) hours as needed for severe pain.  . ramipril (ALTACE) 2.5 MG capsule Take 1 capsule (2.5 mg total) by mouth daily.  . [DISCONTINUED] Vitamin D, Ergocalciferol, (DRISDOL) 50000 UNITS CAPS capsule Take 1 capsule (50,000 Units total) by mouth every 7 (seven) days.  . [DISCONTINUED] azithromycin (ZITHROMAX) 250 MG tablet Take 2 po first day and then one po qd x 4 days    Review of Systems  Constitutional: Negative.   HENT: Negative.   Eyes: Negative.   Respiratory: Negative.   Cardiovascular: Negative.   Gastrointestinal: Negative.   Endocrine: Negative.   Genitourinary: Negative.   Musculoskeletal: Positive for arthralgias (foot pain--- right worse than left).  Skin: Negative.   Allergic/Immunologic: Negative.   Neurological: Negative.   Hematological: Negative.   Psychiatric/Behavioral: Negative.        Objective:   Physical Exam  Constitutional: He is oriented to person, place, and time. He appears well-developed and well-nourished. No distress.  The patient is alert and overweight.  HENT:  Head: Normocephalic and atraumatic.  Right Ear: External ear normal.  Left Ear: External ear normal.  Mouth/Throat: Oropharynx is clear and moist. No oropharyngeal exudate.  There is definite nasal congestion and erythema and swelling in both nostrils.  Eyes: Conjunctivae and EOM are normal. Pupils are equal, round, and reactive to light. Right eye exhibits no discharge. Left eye exhibits no discharge. No scleral icterus.  Neck: Normal range of  motion. Neck supple. No thyromegaly present.  Cardiovascular: Normal rate, regular rhythm, normal heart sounds and intact distal pulses.  Exam reveals no gallop and no friction rub.   No murmur heard. The heart has a regular rate and rhythm at 72/m  Pulmonary/Chest: Effort normal and breath sounds normal. No respiratory distress. He has no wheezes. He has no rales. He exhibits no tenderness.  There is no axillary adenopathy and the chest was clear anteriorly and posteriorly  Abdominal: Soft. Bowel sounds are normal. He exhibits no mass. There is no tenderness. There is no rebound and no guarding.  There are no abdominal bruits There is morbid obesity without masses or tenderness  Musculoskeletal: Normal range of motion. He exhibits no edema or tenderness.  The patient has good range of motion of left shoulder and is slow to move with his feet once he arises from the table.  Lymphadenopathy:    He has no cervical adenopathy.  Neurological: He is alert and oriented to person, place, and time. He has normal reflexes. No cranial nerve deficit.  Skin: Skin is warm and dry. No rash noted. No erythema. No pallor.  Psychiatric: He has a normal mood and affect. His behavior is normal. Judgment and thought content normal.  Nursing note and vitals reviewed.  BP 142/91 mmHg  Pulse 68  Temp(Src) 97.6 F (36.4 C) (Oral)  Ht _0  (1.803 m)  Wt 326 lb (147.873 kg)  BMI 45.49 kg/m2        Assessment & Plan:  1. Hyperlipemia -The patient is statin intolerant and therefore can only work on the cholesterol was his daily habits and diet habits. Until lab work is back he should continue to do the best he can with these therapeutic lifestyle changes - POCT CBC - Hepatic function panel - NMR, lipoprofile  2. Vitamin D deficiency -Until lab work is back. He should continue with vitamin D 50,000 units weekly. - POCT CBC - Vit D  25 hydroxy (rtn osteoporosis monitoring)  3. Essential  hypertension -Blood pressure is elevated today and patient should do better with salt restriction and weight loss and most importantly taking his blood pressure medicine on a regular basis - POCT CBC - BMP8+EGFR - Hepatic function panel  4. Type 2 diabetes mellitus without complication -More exercise and better diet habits or essential. Any further treatment changes will be determined once his hemoglobin A1c is reviewed - POCT CBC - POCT glycosylated hemoglobin (Hb A1C)  5. BPH (benign prostatic hyperplasia) - POCT CBC  6. Bilateral foot pain -Wear firm soled shoes and work on losing weight  7. Morbid obesity -Must do better with exercise and weight loss   8. Noncompliance with medication regimen -Patient was reminded of the consequences of not taking his medicine regularly and this was emphasized to him and he agrees to be more compliant  9. Rhinitis -The patient should use nasal saline, Flonase, cooler environment, and drink more fluids -Use Claritin  Meds ordered this encounter  Medications  .  ramipril (ALTACE) 2.5 MG capsule    Sig: Take 1 capsule (2.5 mg total) by mouth daily.    Dispense:  30 capsule    Refill:  11   Patient Instructions                       Medicare Annual Wellness Visit  [facility] and the medical providers at [facility] strive to bring you the best medical care.  In doing so we not only want to address your current medical conditions and concerns but also to detect new conditions early and prevent illness, disease and health-related problems.    Medicare offers a yearly Wellness Visit which allows our clinical staff to assess your need for preventative services including immunizations, lifestyle education, counseling to decrease risk of preventable diseases and screening for fall risk and other medical concerns.    This visit is provided free of charge (no copay) for all Medicare recipients. The clinical pharmacists at  [facility] have begun to conduct these Wellness Visits which will also include a thorough review of all your medications.    As you primary medical provider recommend that you make an appointment for your Annual Wellness Visit if you have not done so already this year.  You may set up this appointment before you leave today or you may call back [phone]) and schedule an appointment.  Please make sure when you call that you mention that you are scheduling your Annual Wellness Visit with the clinical pharmacist so that the appointment may be made for the proper length of time.     Continue current medications. Continue good therapeutic lifestyle changes which include good diet and exercise. Fall precautions discussed with patient. If an FOBT was given today- please return it to our front desk. If you are over 61 years old - you may need Prevnar 90 or the adult Pneumonia vaccine.  Flu Shots will be available at our office starting mid- September. Please call and schedule a FLU CLINIC APPOINTMENT.   The patient should use nasal saline during the day for head congestion He should try Flonase 1-2 sprays each nostril at bedtime He should use a cool mist humidifier and keep the house as cool as possible Continue follow-up with orthopedist and neurosurgeon as needed for knee and back problems Always remind them of your history of MRSA infection Return the FOBT We will call you with the results of the lab work as soon as that becomes available Wear good firm soled shoes for seizure get up in the morning as this may help your foot pain   Arrie Senate MD

## 2014-08-08 NOTE — Patient Instructions (Addendum)
Medicare Annual Wellness Visit  [place] and the medical providers at [facility] strive to bring you the best medical care.  In doing so we not only want to address your current medical conditions and concerns but also to detect new conditions early and prevent illness, disease and health-related problems.    Medicare offers a yearly Wellness Visit which allows our clinical staff to assess your need for preventative services including immunizations, lifestyle education, counseling to decrease risk of preventable diseases and screening for fall risk and other medical concerns.    This visit is provided free of charge (no copay) for all Medicare recipients. The clinical pharmacists at [facility] have begun to conduct these Wellness Visits which will also include a thorough review of all your medications.    As you primary medical provider recommend that you make an appointment for your Annual Wellness Visit if you have not done so already this year.  You may set up this appointment before you leave today or you may call back [phone] and schedule an appointment.  Please make sure when you call that you mention that you are scheduling your Annual Wellness Visit with the clinical pharmacist so that the appointment may be made for the proper length of time.     Continue current medications. Continue good therapeutic lifestyle changes which include good diet and exercise. Fall precautions discussed with patient. If an FOBT was given today- please return it to our front desk. If you are over 66 years old - you may need Prevnar 13 or the adult Pneumonia vaccine.  Flu Shots will be available at our office starting mid- September. Please call and schedule a FLU CLINIC APPOINTMENT.   The patient should use nasal saline during the day for head congestion He should try Flonase 1-2 sprays each nostril at bedtime He should use a cool  mist humidifier and keep the house as cool as possible Continue follow-up with orthopedist and neurosurgeon as needed for knee and back problems Always remind them of your history of MRSA infection Return the FOBT We will call you with the results of the lab work as soon as that becomes available Wear good firm soled shoes for seizure get up in the morning as this may help your foot pain

## 2014-08-08 NOTE — Addendum Note (Signed)
Addended by: Orma RenderHODGES, Wateen Varon F on: 08/08/2014 12:19 PM   Modules accepted: Orders

## 2014-08-08 NOTE — Addendum Note (Signed)
Addended by: Magdalene RiverBULLINS, JAMIE H on: 08/08/2014 12:06 PM   Modules accepted: Orders

## 2014-08-09 LAB — CBC WITH DIFFERENTIAL
Basophils Absolute: 0.1 10*3/uL (ref 0.0–0.2)
Basos: 1 %
EOS ABS: 0.2 10*3/uL (ref 0.0–0.4)
Eos: 3 %
HEMATOCRIT: 47.2 % (ref 37.5–51.0)
Hemoglobin: 16.4 g/dL (ref 12.6–17.7)
IMMATURE GRANS (ABS): 0 10*3/uL (ref 0.0–0.1)
Immature Granulocytes: 0 %
LYMPHS: 31 %
Lymphocytes Absolute: 1.9 10*3/uL (ref 0.7–3.1)
MCH: 30.4 pg (ref 26.6–33.0)
MCHC: 34.7 g/dL (ref 31.5–35.7)
MCV: 87 fL (ref 79–97)
MONOS ABS: 0.5 10*3/uL (ref 0.1–0.9)
Monocytes: 8 %
NEUTROS PCT: 57 %
Neutrophils Absolute: 3.4 10*3/uL (ref 1.4–7.0)
PLATELETS: 250 10*3/uL (ref 150–379)
RBC: 5.4 x10E6/uL (ref 4.14–5.80)
RDW: 13.4 % (ref 12.3–15.4)
WBC: 5.9 10*3/uL (ref 3.4–10.8)

## 2014-08-09 LAB — VITAMIN D 25 HYDROXY (VIT D DEFICIENCY, FRACTURES): Vit D, 25-Hydroxy: 25.5 ng/mL — ABNORMAL LOW (ref 30.0–100.0)

## 2014-08-09 LAB — BMP8+EGFR
BUN / CREAT RATIO: 9 — AB (ref 10–22)
BUN: 11 mg/dL (ref 8–27)
CO2: 20 mmol/L (ref 18–29)
Calcium: 9.6 mg/dL (ref 8.6–10.2)
Chloride: 101 mmol/L (ref 97–108)
Creatinine, Ser: 1.18 mg/dL (ref 0.76–1.27)
GFR calc Af Amer: 74 mL/min/{1.73_m2} (ref >59)
GFR calc non Af Amer: 64 mL/min/{1.73_m2} (ref >59)
GLUCOSE: 103 mg/dL — AB (ref 65–99)
Potassium: 4.4 mmol/L (ref 3.5–5.2)
SODIUM: 141 mmol/L (ref 134–144)

## 2014-08-09 LAB — NMR, LIPOPROFILE
Cholesterol: 162 mg/dL (ref 100–199)
HDL CHOLESTEROL BY NMR: 38 mg/dL — AB (ref >39)
HDL Particle Number: 31 umol/L (ref >=30.5)
LDL Particle Number: 1307 nmol/L — ABNORMAL HIGH (ref <1000)
LDL SIZE: 19.7 nm (ref >20.5)
LDL-C: 91 mg/dL (ref 0–99)
LP-IR Score: 80 — ABNORMAL HIGH (ref <=45)
SMALL LDL PARTICLE NUMBER: 984 nmol/L — AB (ref <=527)
TRIGLYCERIDES BY NMR: 163 mg/dL — AB (ref 0–149)

## 2014-08-09 LAB — HEPATIC FUNCTION PANEL
ALK PHOS: 63 IU/L (ref 39–117)
ALT: 32 IU/L (ref 0–44)
AST: 34 IU/L (ref 0–40)
Albumin: 4.7 g/dL (ref 3.6–4.8)
Bilirubin, Direct: 0.18 mg/dL (ref 0.00–0.40)
Total Bilirubin: 0.7 mg/dL (ref 0.0–1.2)
Total Protein: 7.1 g/dL (ref 6.0–8.5)

## 2014-08-09 LAB — SEDIMENTATION RATE: Sed Rate: 11 mm/hr (ref 0–30)

## 2014-12-20 ENCOUNTER — Ambulatory Visit: Payer: Medicare Other | Admitting: Family Medicine

## 2014-12-26 ENCOUNTER — Encounter: Payer: Self-pay | Admitting: Family Medicine

## 2014-12-26 ENCOUNTER — Ambulatory Visit (INDEPENDENT_AMBULATORY_CARE_PROVIDER_SITE_OTHER): Payer: Medicare Other | Admitting: Family Medicine

## 2014-12-26 ENCOUNTER — Encounter (INDEPENDENT_AMBULATORY_CARE_PROVIDER_SITE_OTHER): Payer: Self-pay

## 2014-12-26 VITALS — BP 129/82 | HR 64 | Temp 97.3°F | Ht 71.0 in | Wt 324.0 lb

## 2014-12-26 DIAGNOSIS — Z889 Allergy status to unspecified drugs, medicaments and biological substances status: Secondary | ICD-10-CM | POA: Diagnosis not present

## 2014-12-26 DIAGNOSIS — E785 Hyperlipidemia, unspecified: Secondary | ICD-10-CM | POA: Diagnosis not present

## 2014-12-26 DIAGNOSIS — E8881 Metabolic syndrome: Secondary | ICD-10-CM

## 2014-12-26 DIAGNOSIS — Z789 Other specified health status: Secondary | ICD-10-CM

## 2014-12-26 DIAGNOSIS — E559 Vitamin D deficiency, unspecified: Secondary | ICD-10-CM

## 2014-12-26 DIAGNOSIS — I10 Essential (primary) hypertension: Secondary | ICD-10-CM

## 2014-12-26 DIAGNOSIS — M5442 Lumbago with sciatica, left side: Secondary | ICD-10-CM | POA: Diagnosis not present

## 2014-12-26 DIAGNOSIS — N400 Enlarged prostate without lower urinary tract symptoms: Secondary | ICD-10-CM | POA: Insufficient documentation

## 2014-12-26 LAB — POCT URINALYSIS DIPSTICK
Bilirubin, UA: NEGATIVE
GLUCOSE UA: NEGATIVE
Ketones, UA: NEGATIVE
LEUKOCYTES UA: NEGATIVE
Nitrite, UA: NEGATIVE
PH UA: 6
Protein, UA: NEGATIVE
SPEC GRAV UA: 1.025
UROBILINOGEN UA: NEGATIVE

## 2014-12-26 LAB — POCT CBC
GRANULOCYTE PERCENT: 61.4 % (ref 37–80)
HEMATOCRIT: 49.9 % (ref 43.5–53.7)
HEMOGLOBIN: 15.9 g/dL (ref 14.1–18.1)
Lymph, poc: 1.8 (ref 0.6–3.4)
MCH, POC: 28.4 pg (ref 27–31.2)
MCHC: 31.9 g/dL (ref 31.8–35.4)
MCV: 89.1 fL (ref 80–97)
MPV: 6.8 fL (ref 0–99.8)
POC GRANULOCYTE: 3.5 (ref 2–6.9)
POC LYMPH %: 31.1 % (ref 10–50)
Platelet Count, POC: 254 10*3/uL (ref 142–424)
RBC: 5.6 M/uL (ref 4.69–6.13)
RDW, POC: 13.3 %
WBC: 5.7 10*3/uL (ref 4.6–10.2)

## 2014-12-26 LAB — POCT UA - MICROSCOPIC ONLY
Bacteria, U Microscopic: NEGATIVE
Casts, Ur, LPF, POC: NEGATIVE
Crystals, Ur, HPF, POC: NEGATIVE
Mucus, UA: NEGATIVE
WBC, UR, HPF, POC: NEGATIVE
Yeast, UA: NEGATIVE

## 2014-12-26 NOTE — Patient Instructions (Addendum)
    Medicare Annual Wellness Visit  [facility] and the medical providers at [facility] strive to bring you the best medical care.  In doing so we not only want to address your current medical conditions and concerns but also to detect new conditions early and prevent illness, disease and health-related problems.    Medicare offers a yearly Wellness Visit which allows our clinical staff to assess your need for preventative services including immunizations, lifestyle education, counseling to decrease risk of preventable diseases and screening for fall risk and other medical concerns.    This visit is provided free of charge (no copay) for all Medicare recipients. The clinical pharmacists at [facility] have begun to conduct these Wellness Visits which will also include a thorough review of all your medications.    As you primary medical provider recommend that you make an appointment for your Annual Wellness Visit if you have not done so already this year.  You may set up this appointment before you leave today or you may call back [phone]) and schedule an appointment.  Please make sure when you call that you mention that you are scheduling your Annual Wellness Visit with the clinical pharmacist so that the appointment may be made for the proper length of time.   Continue current medications. Continue good therapeutic lifestyle changes which include good diet and exercise. Fall precautions discussed with patient. If an FOBT was given today- please return it to our front desk. If you are over 66 years old - you may need Prevnar 13 or the adult Pneumonia vaccine.  Flu Shots are still available at our office. If you still haven't had one please call to set up a nurse visit to get one.   After your visit with us today you will receive a survey in the mail or online from [employer] regarding your care with us. Please take a moment to fill this out. Your  feedback is very important to us as you can help us better understand your patient needs as well as improve your experience and satisfaction. WE CARE ABOUT YOU!!!   Continue Claritin as needed Continue to drink plenty of fluids Continue to work on losing weight Stay as active as possible

## 2014-12-26 NOTE — Progress Notes (Signed)
Subjective:    Patient ID: Seth Martinez, male    DOB: 07/09/1951, 65 y.o.   MRN: [id number]  HPI  The patient is doing well and returns to the office for follow-up of chronic medical problems which include hypertension and obesity and hyperlipidemia. He also has chronic back pain and degenerative disc issues. The back pain does radiate down the legs and he has seen the neurosurgeon and sees him periodically for this currently this is doing well and no surgery is planned for this. The patient denies chest pain but does have occasional shortness of breath unrelated to activity. He has no trouble with his GI system, no problems with swallowing heartburn indigestion nausea vomiting diarrhea or blood in the stool. He is voiding without problems. He has seen the eye doctor and is up-to-date on his eye exams. He does complain of some decreased memory issues at times. He is alert today in good spirits.  Patient Active Problem List   Diagnosis Date Noted  . Metabolic syndrome 40/98/1191  . Multiple drug allergies 09/28/2013  . Statin intolerance 09/28/2013  . Nocturia 07/25/2013  . Rash and nonspecific skin eruption 05/15/2013  . Morbid obesity 05/04/2013  . Staphylococcus aureus septicemia, history of 05/04/2013  . Septic arthritis of multiple joints, history of 05/02/2013  . Hyperlipemia 12/28/2012  . Hypertension 12/28/2012  . Vitamin D deficiency 12/28/2012   Outpatient Encounter Prescriptions as of 12/26/2014  Medication Sig  . Naproxen Sodium 220 MG CAPS Take 2 capsules by mouth 2 (two) times daily as needed.  . ramipril (ALTACE) 2.5 MG capsule Take 1 capsule (2.5 mg total) by mouth daily.  Marland Kitchen acetaminophen (TYLENOL) 325 MG tablet Take 650 mg by mouth every 6 (six) hours as needed.  . cyclobenzaprine (FLEXERIL) 10 MG tablet Take 1 tablet (10 mg total) by mouth 3 (three) times daily as needed for muscle spasms. (Patient not taking: Reported on 12/26/2014)  . EPINEPHrine (EPI-PEN) 0.3  mg/0.3 mL SOAJ injection Inject 0.3 mLs (0.3 mg total) into the muscle once. (Patient not taking: Reported on 12/26/2014)  . oxyCODONE-acetaminophen (ROXICET) 5-325 MG per tablet Take 1 tablet by mouth every 8 (eight) hours as needed for severe pain. (Patient not taking: Reported on 12/26/2014)   No facility-administered encounter medications on file as of 12/26/2014.      Review of Systems  Constitutional: Negative.   HENT: Positive for postnasal drip and sinus pressure. Negative for congestion, ear pain, hearing loss and rhinorrhea.   Eyes: Negative.   Respiratory: Positive for shortness of breath.   Cardiovascular: Negative.   Gastrointestinal: Negative.   Endocrine: Negative.   Genitourinary: Negative.   Musculoskeletal: Positive for back pain and arthralgias.  Skin: Negative.   Allergic/Immunologic: Negative.   Neurological: Negative for weakness, light-headedness and headaches.  Hematological: Negative.   Psychiatric/Behavioral: Negative.        Objective:   Physical Exam  Constitutional: He is oriented to person, place, and time. He appears well-developed and well-nourished. No distress.  The patient is alert and overweight and other than his back pain that he has periodically with pain radiating down both legs he is doing well.  HENT:  Head: Normocephalic and atraumatic.  Right Ear: External ear normal.  Left Ear: External ear normal.  Nose: Nose normal.  Mouth/Throat: Oropharynx is clear and moist. No oropharyngeal exudate.  Minimal nasal congestion  Eyes: Conjunctivae and EOM are normal. Pupils are equal, round, and reactive to light. Right eye exhibits no discharge. Left eye  exhibits no discharge. No scleral icterus.  Neck: Normal range of motion. Neck supple. No tracheal deviation present. No thyromegaly present.  No carotid bruits or anterior cervical adenopathy or thyromegaly  Cardiovascular: Normal rate, regular rhythm, normal heart sounds and intact distal pulses.     No murmur heard. At 72/m  Pulmonary/Chest: Effort normal and breath sounds normal. No respiratory distress. He has no wheezes. He has no rales. He exhibits no tenderness.  Clear anteriorly and posteriorly  Abdominal: Soft. Bowel sounds are normal. He exhibits no mass. There is no tenderness. There is no rebound and no guarding.  The patient has morbid obesity.  Genitourinary:  This was not examined today and patient indicates this was done at the last visit although there is no record in the visit of this having been done but I will take his word for it. We will get his PSA today and resume regular prostate exams in one year from now.  Musculoskeletal: Normal range of motion. He exhibits no edema or tenderness.  Lymphadenopathy:    He has no cervical adenopathy.  Neurological: He is alert and oriented to person, place, and time. He has normal reflexes. No cranial nerve deficit.  Skin: Skin is warm and dry. No rash noted. No erythema. No pallor.  Psychiatric: He has a normal mood and affect. His behavior is normal. Judgment and thought content normal.  Nursing note and vitals reviewed.   BP 129/82 mmHg  Pulse 64  Temp(Src) 97.3 F (36.3 C) (Oral)  Ht '5\' 11"'  (1.803 m)  Wt 324 lb (146.965 kg)  BMI 45.21 kg/m2       Assessment & Plan:   1. Vitamin D deficiency -The patient is currently not taking any vitamin D and we will ask him to start this if his D level comes back low. - Vit D  25 hydroxy (rtn osteoporosis monitoring)  2. Hyperlipemia -The patient is statin intolerant and has been tried on multiple statins in the past with side effects. He must continue with aggressive therapeutic lifestyle changes - Hepatic function panel - NMR, lipoprofile  3. Essential hypertension -Must watch his sodium intake and take his blood pressure medicine regularly. - BMP8+EGFR - POCT CBC - POCT UA - Microscopic Only - POCT urinalysis dipstick  4. Metabolic syndrome -He needs to keep  working on his weight with walking activity and diet - POCT UA - Microscopic Only  5. BPH (benign prostatic hypertrophy) -The patient is having no issues with voiding and we will check his prostate in 1 year - PSA Total+%Free (Serial) - POCT UA - Microscopic Only - POCT urinalysis dipstick  6. Morbid obesity -He will continue with aggressive therapeutic lifestyle changes  7. Statin intolerance -Continue with aggressive therapeutic lifestyle changes  8. Low back pain with left-sided sciatica, unspecified back pain laterality -Follow-up with neurosurgeon as needed  Patient Instructions     Medicare Annual Wellness Visit  [facility] and the medical providers at [facility] strive to bring you the best medical care.  In doing so we not only want to address your current medical conditions and concerns but also to detect new conditions early and prevent illness, disease and health-related problems.    Medicare offers a yearly Wellness Visit which allows our clinical staff to assess your need for preventative services including immunizations, lifestyle education, counseling to decrease risk of preventable diseases and screening for fall risk and other medical concerns.    This visit is provided free  of charge (no copay) for all Medicare recipients. The clinical pharmacists at [facility] have begun to conduct these Wellness Visits which will also include a thorough review of all your medications.    As you primary medical provider recommend that you make an appointment for your Annual Wellness Visit if you have not done so already this year.  You may set up this appointment before you leave today or you may call back ([phone]) and schedule an appointment.  Please make sure when you call that you mention that you are scheduling your Annual Wellness Visit with the clinical pharmacist so that the appointment may be made for the proper length of  time.   Continue current medications. Continue good therapeutic lifestyle changes which include good diet and exercise. Fall precautions discussed with patient. If an FOBT was given today- please return it to our front desk. If you are over 75 years old - you may need Prevnar 50 or the adult Pneumonia vaccine.  Flu Shots are still available at our office. If you still haven't had one please call to set up a nurse visit to get one.   After your visit with [place] today you will receive a survey in the mail or online from [employer] regarding your care with [place]. Please take a moment to fill this out. Your feedback is very important to [place] as you can help [place] better understand your patient needs as well as improve your experience and satisfaction. WE CARE ABOUT YOU!!!   Continue Claritin as needed Continue to drink plenty of fluids Continue to work on losing weight Stay as active as possible   Arrie Senate MD

## 2014-12-27 LAB — HEPATIC FUNCTION PANEL
ALT: 33 IU/L (ref 0–44)
AST: 36 IU/L (ref 0–40)
Albumin: 4.5 g/dL (ref 3.6–4.8)
Alkaline Phosphatase: 65 IU/L (ref 39–117)
Bilirubin Total: 0.5 mg/dL (ref 0.0–1.2)
Bilirubin, Direct: 0.12 mg/dL (ref 0.00–0.40)
TOTAL PROTEIN: 6.8 g/dL (ref 6.0–8.5)

## 2014-12-27 LAB — BMP8+EGFR
BUN/Creatinine Ratio: 11 (ref 10–22)
BUN: 12 mg/dL (ref 8–27)
CO2: 20 mmol/L (ref 18–29)
Calcium: 9.3 mg/dL (ref 8.6–10.2)
Chloride: 103 mmol/L (ref 97–108)
Creatinine, Ser: 1.05 mg/dL (ref 0.76–1.27)
GFR calc Af Amer: 86 mL/min/{1.73_m2} (ref >59)
GFR calc non Af Amer: 74 mL/min/{1.73_m2} (ref >59)
Glucose: 112 mg/dL — ABNORMAL HIGH (ref 65–99)
Potassium: 4.4 mmol/L (ref 3.5–5.2)
Sodium: 142 mmol/L (ref 134–144)

## 2014-12-27 LAB — NMR, LIPOPROFILE
Cholesterol: 167 mg/dL (ref 100–199)
HDL Cholesterol by NMR: 37 mg/dL — ABNORMAL LOW (ref >39)
HDL PARTICLE NUMBER: 31.1 umol/L (ref >=30.5)
LDL Particle Number: 1292 nmol/L — ABNORMAL HIGH (ref <1000)
LDL Size: 19.9 nm (ref >20.5)
LDL-C: 92 mg/dL (ref 0–99)
LP-IR SCORE: 74 — AB (ref <=45)
Small LDL Particle Number: 922 nmol/L — ABNORMAL HIGH (ref <=527)
Triglycerides by NMR: 191 mg/dL — ABNORMAL HIGH (ref 0–149)

## 2014-12-27 LAB — VITAMIN D 25 HYDROXY (VIT D DEFICIENCY, FRACTURES): Vit D, 25-Hydroxy: 30.1 ng/mL (ref 30.0–100.0)

## 2014-12-27 LAB — PSA TOTAL+% FREE (SERIAL)
PSA, Free Pct: 40 %
PSA, Free: 0.28 ng/mL
Prostate Specific Ag, Serum: 0.7 ng/mL (ref 0.0–4.0)

## 2015-01-01 ENCOUNTER — Encounter: Payer: Self-pay | Admitting: *Deleted

## 2015-01-01 NOTE — Progress Notes (Signed)
lmtcb

## 2015-01-17 ENCOUNTER — Telehealth: Payer: Self-pay | Admitting: Family Medicine

## 2015-01-17 NOTE — Telephone Encounter (Signed)
The prescription for Flexeril is okay to refill

## 2015-01-17 NOTE — Telephone Encounter (Signed)
Is okay to send a prescription for Flexeril 10 mg 1 daily if needed for muscle relaxation #30

## 2015-01-18 ENCOUNTER — Other Ambulatory Visit: Payer: Self-pay

## 2015-01-18 MED ORDER — CYCLOBENZAPRINE HCL 10 MG PO TABS: 10.0000 mg | ORAL_TABLET | Freq: Three times a day (TID) | ORAL | Status: DC | PRN

## 2015-01-30 IMAGING — CT CT L SPINE W/ CM
2 series · 14 of 25 positions shown · IV contrast (omnipaque)
Comparison: None.

CLINICAL DATA: Back pain with staph bacteremia. Rule out discitis/
osteomyelitis

EXAM:
CT LUMBAR SPINE WITH CONTRAST
TECHNIQUE: Multidetector CT imaging of the lumbar spine was performed with
intravenous contrast administration. Multiplanar CT image
reconstructions were also generated.
CONTRAST:  100mL OMNIPAQUE IOHEXOL 300 MG/ML  SOLN

[Series 9: coronal st · coronal · 0.43mm/px · 3 of 73 slices shown]
[im 15/73  bone]
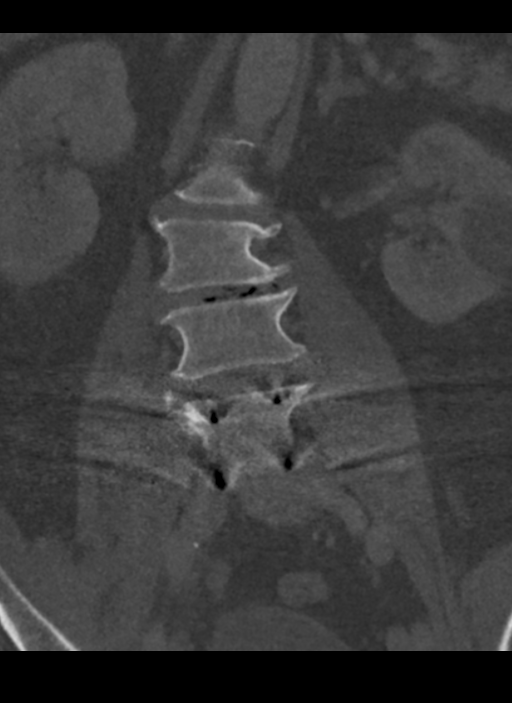
[im 29/73  bone]
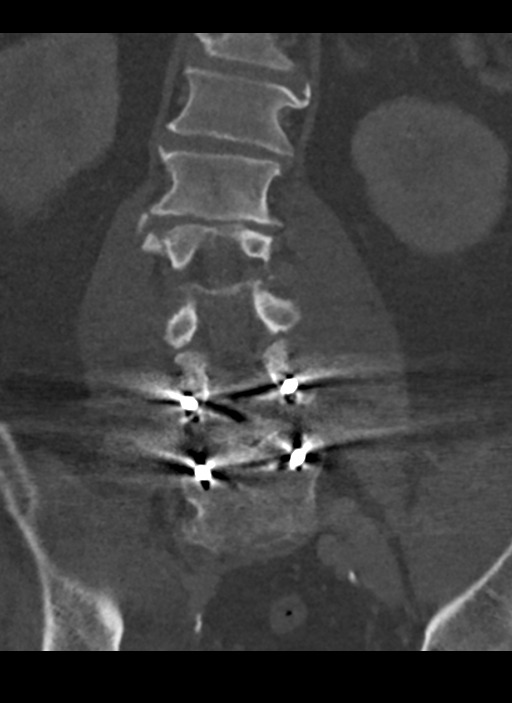
[im 44/73  bone]
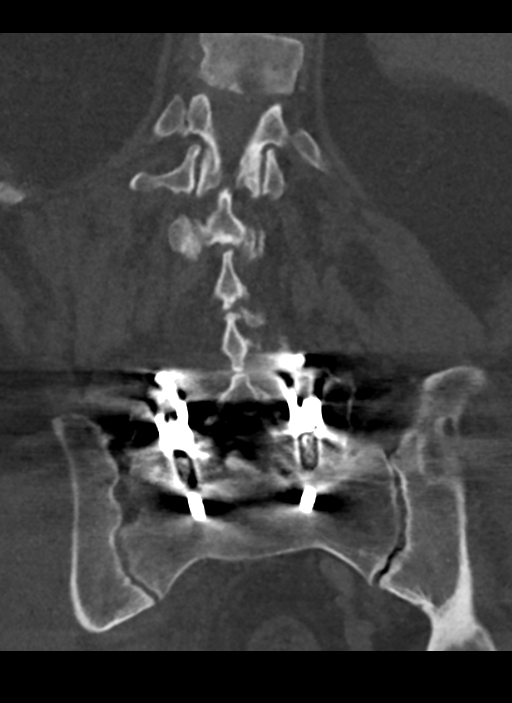

[Series 10: sagittal st · sagittal · 0.50mm/px · 11 of 108 slices shown]
[im 9/108  bone]
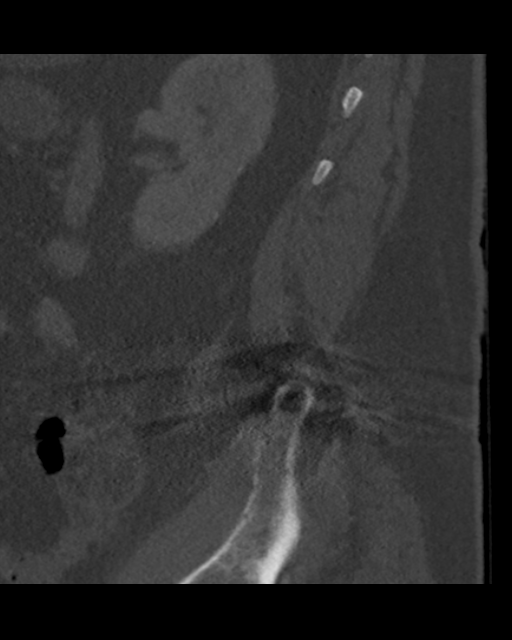
[im 18/108  bone]
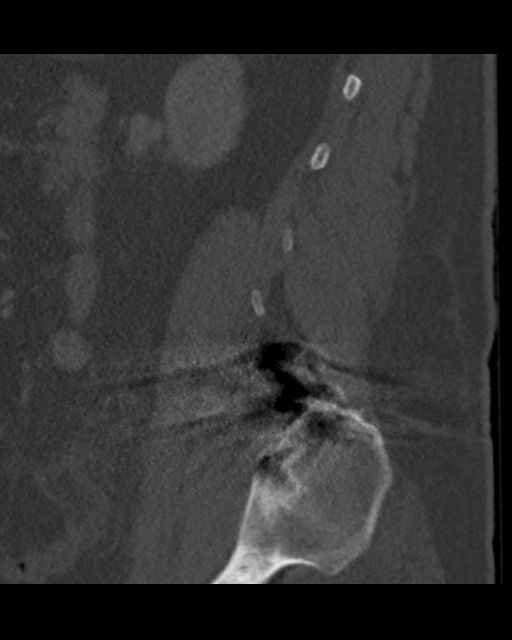
[im 27/108  bone]
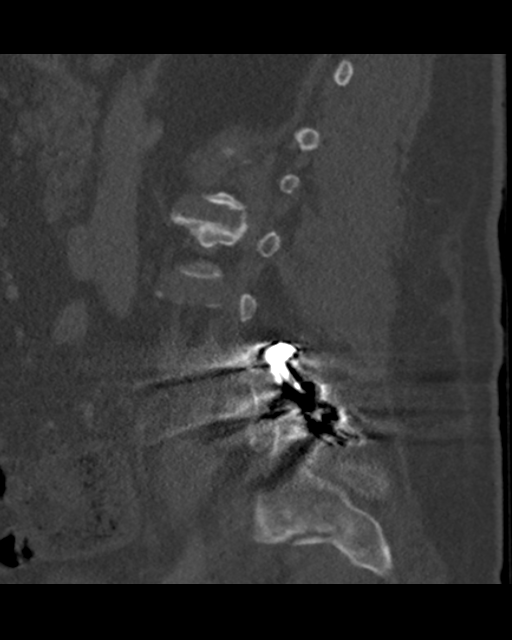
[im 36/108  bone]
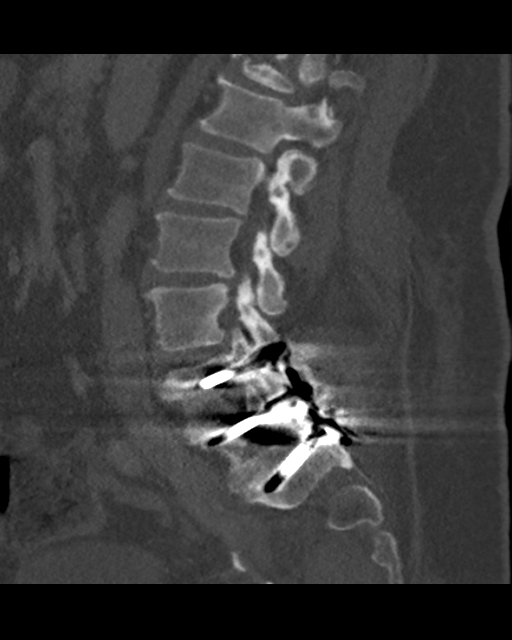
[im 45/108  bone]
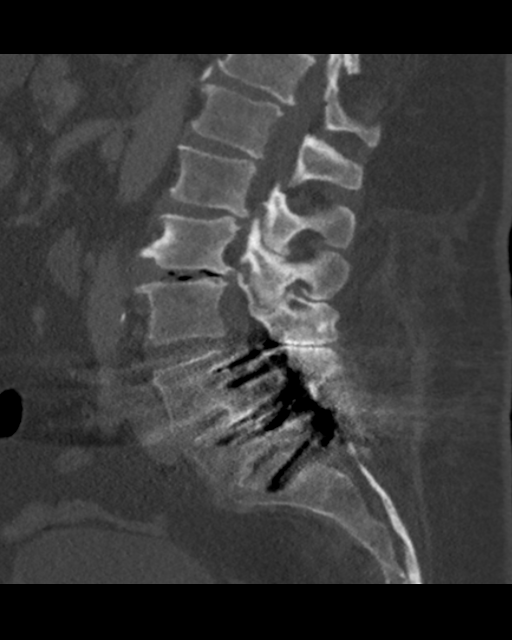
[im 54/108  bone]
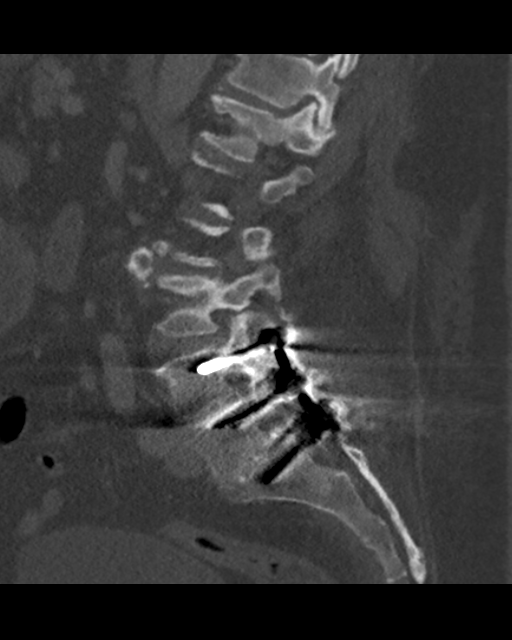
[im 63/108  bone]
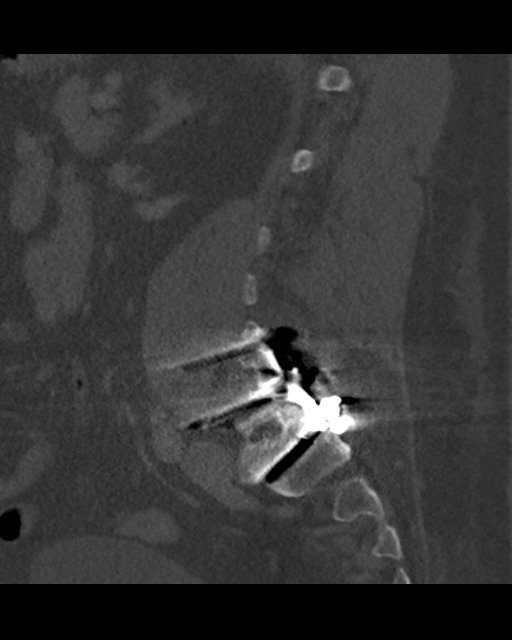
[im 72/108  bone]
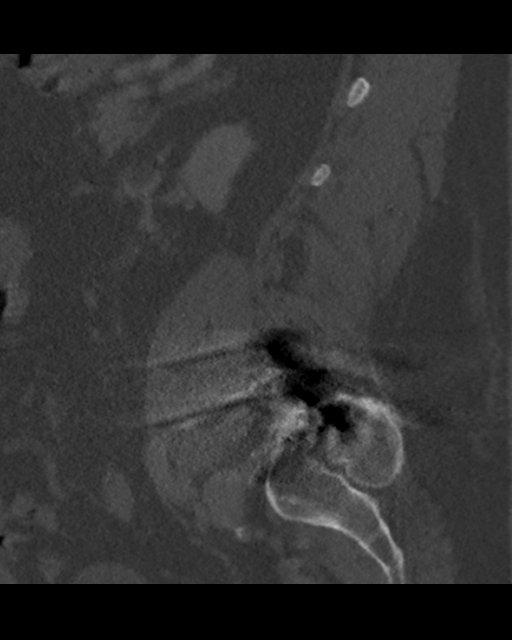
[im 81/108  bone]
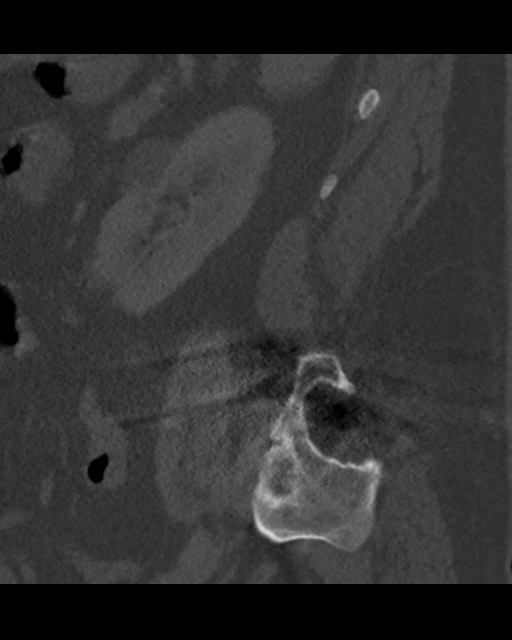
[im 90/108  bone]
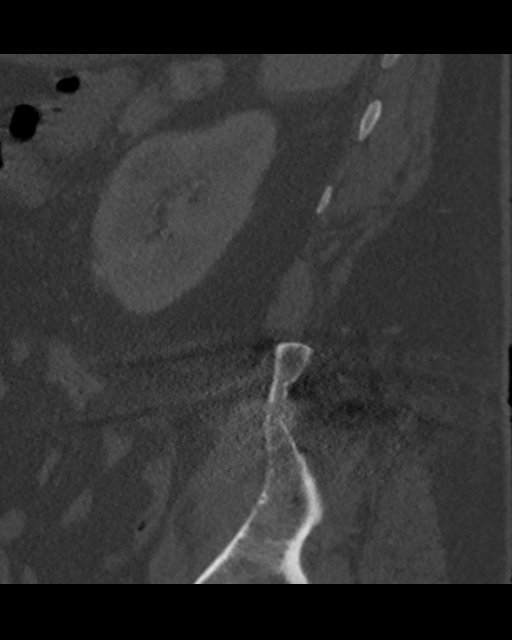
[im 99/108  bone]
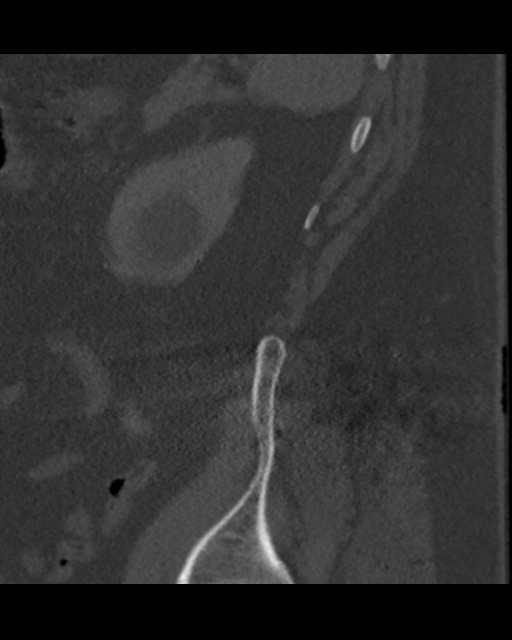

[14 of 25 positions shown; findings below may reference images not displayed]

FINDINGS: Image quality degraded by obesity.

Negative for fracture or mass. No CT findings of discitis or
osteomyelitis. No endplate erosion. No epidural or paraspinous
abscess. Psoas muscles are symmetric and appear normal bilaterally.
Left renal cyst.

T12-L1:  Mild spondylosis and mild facet degeneration

L1-2: Moderate spondylosis and facet degeneration with mild spinal
stenosis. There is left foraminal encroachment due to spurring

L2-3: Moderate spondylosis. Left lateral recess and left foraminal
encroachment. Moderate facet hypertrophy bilaterally. Moderate
spinal stenosis.

L3-4: Mild disk degeneration with moderate facet degeneration. Mild
spinal stenosis.

L4-5: Pedicle screw fusion. Hardware appears to be in satisfactory
position. There is extensive streak artifact related to hardware
obscuring the canal

L5-S1: Bilateral pedicle screw fusion with extensive artifact
obscuring the disc space.
IMPRESSION: Suboptimal image quality. The patient is obese. There is extensive
streak artifact related to pedicle screw fusion bilaterally at L4-5
and L5-S1.

Spondylosis with mild spinal stenosis at L1-2. Moderate spinal
stenosis at L2-3.

No evidence of fracture or spinal infection.

## 2015-02-01 IMAGING — CR DG CHEST 2V
2 series · 2 of 2 positions shown · non-contrast
Comparison: 08/24/2012.

CLINICAL DATA: Fever with shortness of breath.

EXAM:
CHEST  2 VIEW

[w chest pa]
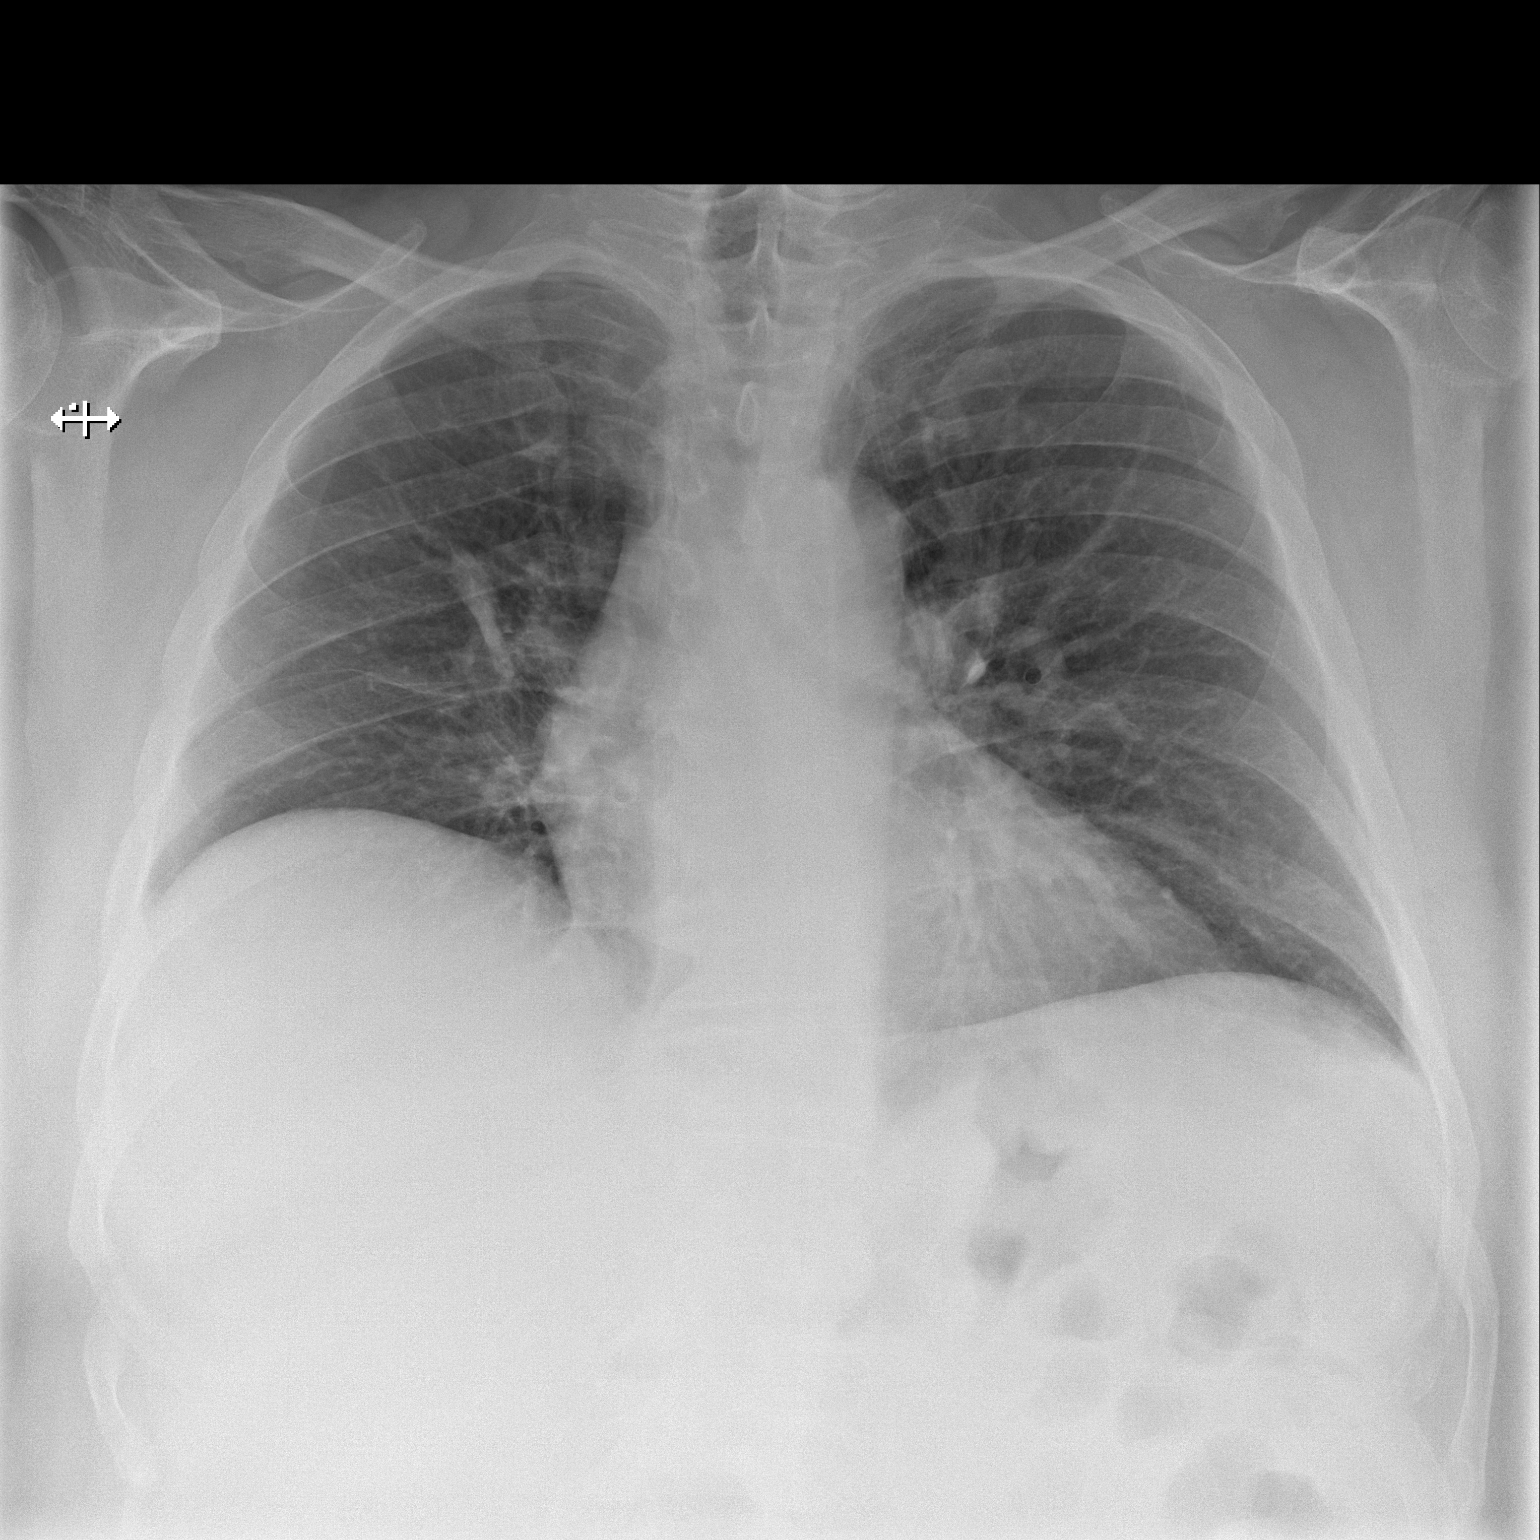

[w chest lat]
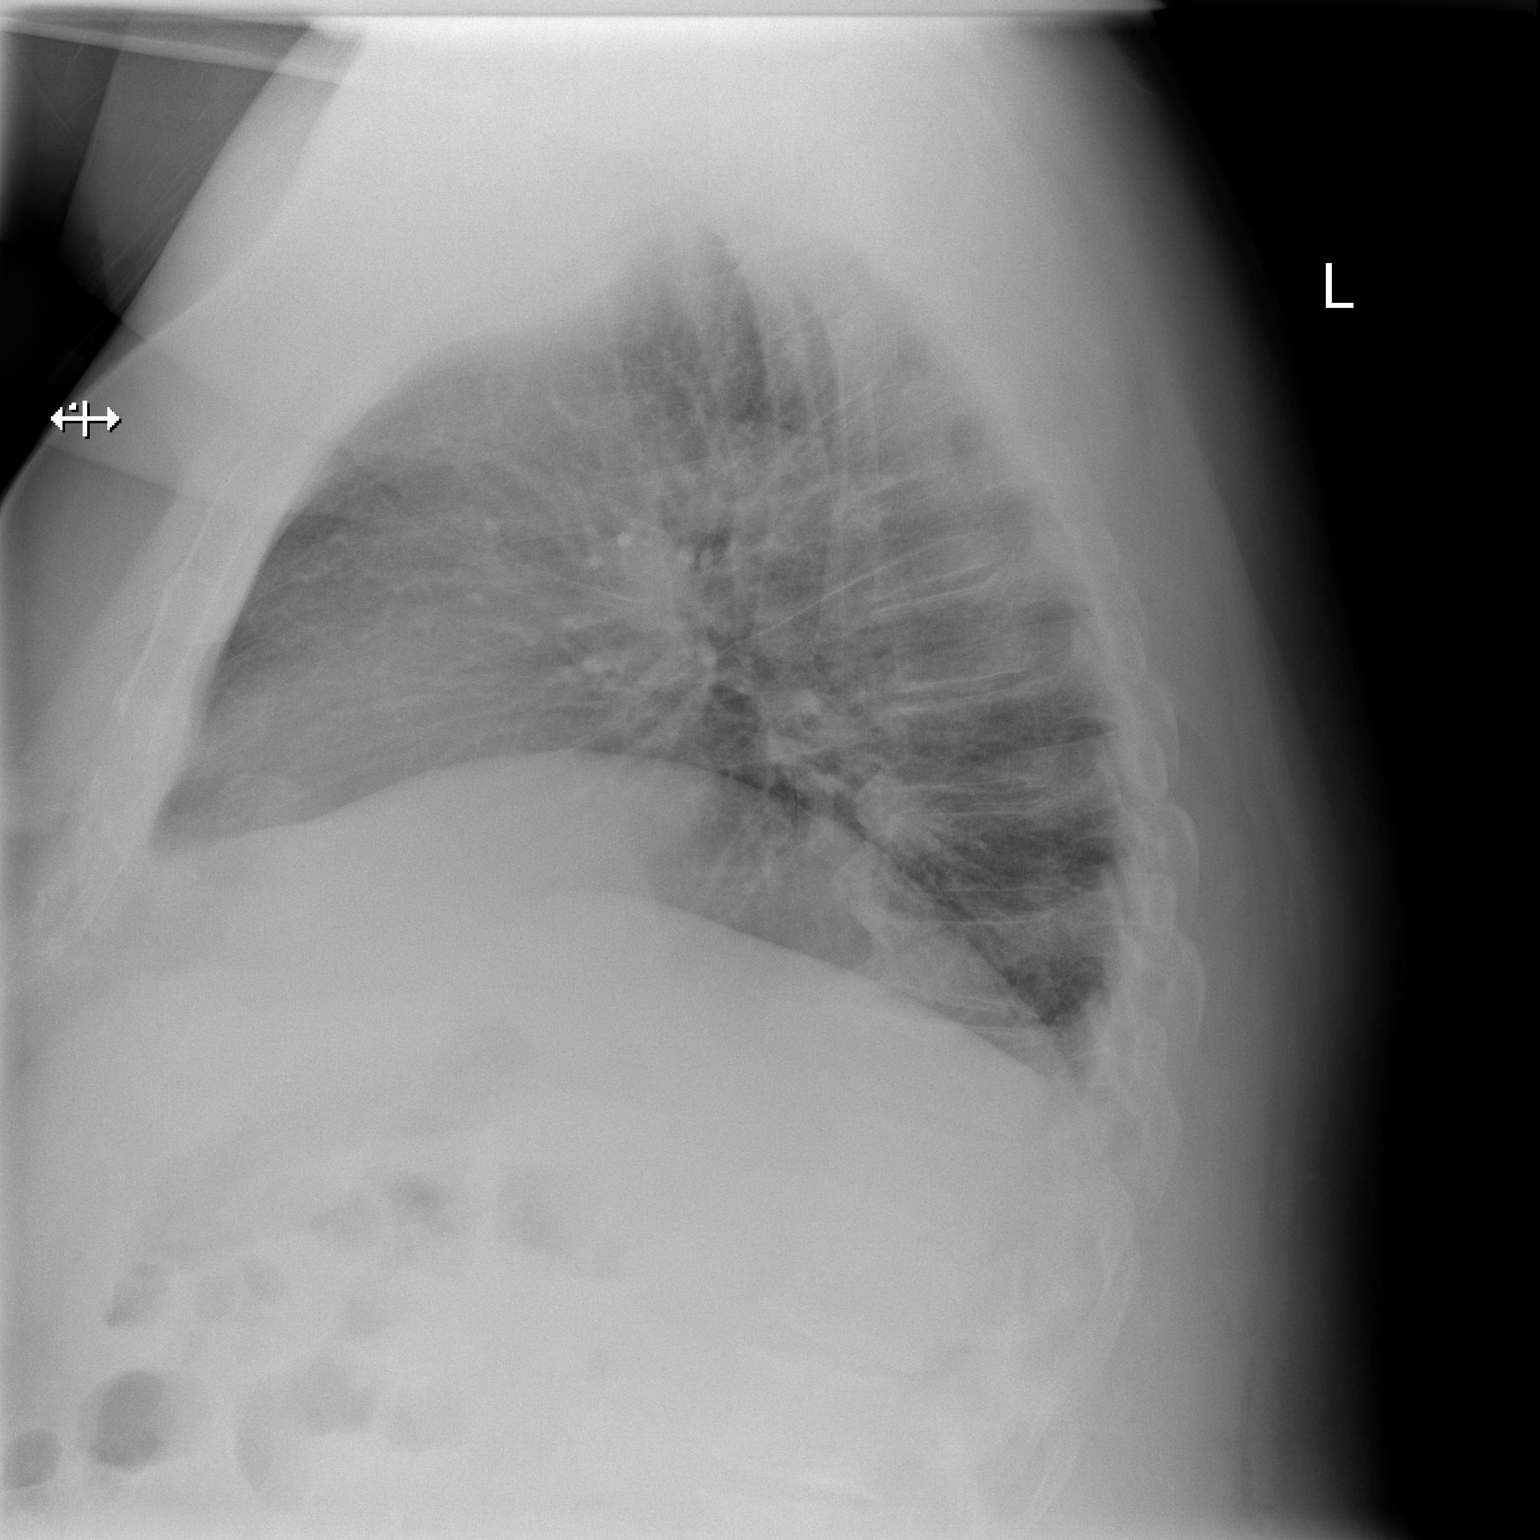

[2 of 2 positions shown; findings below may reference images not displayed]

FINDINGS: There are lower lung volumes with new linear right upper lobe
atelectasis, fissural thickening and central airway thickening.
There is no overt pulmonary edema, confluent airspace opacity or
pleural effusion. The pulmonary vascularity appears normal. The
heart size and mediastinal contours are stable.
IMPRESSION: Increased central airway thickening and interstitial prominence
suspicious for bronchitis. No evidence of pneumonia.

## 2015-02-07 ENCOUNTER — Encounter: Payer: Self-pay | Admitting: *Deleted

## 2015-02-25 ENCOUNTER — Telehealth: Payer: Self-pay | Admitting: Family Medicine

## 2015-02-25 MED ORDER — OXYCODONE-ACETAMINOPHEN 5-325 MG PO TABS: 1.0000 | ORAL_TABLET | Freq: Three times a day (TID) | ORAL | Status: DC | PRN

## 2015-02-25 NOTE — Telephone Encounter (Signed)
DWM, if you approve - i will order.

## 2015-02-25 NOTE — Telephone Encounter (Signed)
This is okay to refill 1 

## 2015-04-26 ENCOUNTER — Telehealth: Payer: Self-pay | Admitting: Family Medicine

## 2015-04-26 MED ORDER — OXYCODONE-ACETAMINOPHEN 5-325 MG PO TABS: 1.0000 | ORAL_TABLET | Freq: Three times a day (TID) | ORAL | Status: DC | PRN

## 2015-04-26 NOTE — Telephone Encounter (Signed)
Pt aware written Rx is at front desk ready for pickup  

## 2015-04-26 NOTE — Telephone Encounter (Signed)
This is okay to refill 1 

## 2015-04-26 NOTE — Telephone Encounter (Signed)
Last filled 02/25/15, last seen 12/26/14. Rx will print

## 2015-04-29 DIAGNOSIS — M25561 Pain in right knee: Secondary | ICD-10-CM | POA: Diagnosis not present

## 2015-04-29 DIAGNOSIS — M25551 Pain in right hip: Secondary | ICD-10-CM | POA: Diagnosis not present

## 2015-04-29 DIAGNOSIS — M545 Low back pain: Secondary | ICD-10-CM | POA: Diagnosis not present

## 2015-05-01 ENCOUNTER — Ambulatory Visit: Payer: Medicare Other | Admitting: Family Medicine

## 2015-05-02 ENCOUNTER — Ambulatory Visit: Payer: Medicare Other | Admitting: Family Medicine

## 2015-05-03 ENCOUNTER — Telehealth: Payer: Self-pay | Admitting: Family Medicine

## 2015-05-03 NOTE — Telephone Encounter (Signed)
Pt was supposed to come in last Tuesday  3 weeks ago he pulled back, went to DR  Eulah PontMurphy on Monday, got shot in knee and hip  He gave pred taper and percocet. He is not sleeping and still in pain.  The higher dose on prednisone helped so much -  i told pt that since [place] rx'd this    He needs to call them

## 2015-05-08 ENCOUNTER — Encounter: Payer: Self-pay | Admitting: Family Medicine

## 2015-05-08 ENCOUNTER — Ambulatory Visit (INDEPENDENT_AMBULATORY_CARE_PROVIDER_SITE_OTHER): Payer: Medicare Other | Admitting: Family Medicine

## 2015-05-08 ENCOUNTER — Other Ambulatory Visit: Payer: Self-pay | Admitting: Family Medicine

## 2015-05-08 VITALS — BP 153/94 | HR 73 | Temp 97.8°F | Ht 71.0 in | Wt 313.0 lb

## 2015-05-08 DIAGNOSIS — G629 Polyneuropathy, unspecified: Secondary | ICD-10-CM | POA: Diagnosis not present

## 2015-05-08 DIAGNOSIS — M25561 Pain in right knee: Secondary | ICD-10-CM

## 2015-05-08 DIAGNOSIS — R1031 Right lower quadrant pain: Secondary | ICD-10-CM

## 2015-05-08 DIAGNOSIS — M25551 Pain in right hip: Secondary | ICD-10-CM | POA: Diagnosis not present

## 2015-05-08 DIAGNOSIS — I10 Essential (primary) hypertension: Secondary | ICD-10-CM

## 2015-05-08 DIAGNOSIS — K469 Unspecified abdominal hernia without obstruction or gangrene: Secondary | ICD-10-CM

## 2015-05-08 MED ORDER — OXYCODONE-ACETAMINOPHEN 5-325 MG PO TABS: 1.0000 | ORAL_TABLET | Freq: Three times a day (TID) | ORAL | Status: DC | PRN

## 2015-05-08 NOTE — Progress Notes (Signed)
Subjective:    Patient ID: Seth Martinez, male    DOB: 29-Nov-1950, 64 y.o.   MRN: [id number]  HPI  Patient here today for right side knee and hip pain. He has seen Dr Eulah Pont for this about 1 week ago. The patient has a history of septic arthritis in the past. He has been treated recently with a round of prednisone. He is also concerned that he may have an inguinal hernia leading a role with his abdominal pain. Most of the pain he is having in his joints is in the right knee and hip area. The patient indicates that he stepped out of a truck a couple of weeks ago and had severe pain in the right hip and it has become worse since that time. He felt like his hip gave way according to the patient. The pain the next day was better and then the second day after the stepping out of the truck it got much worse and has not improved since that time. He subsequently went to see his orthopedist who placed an injection of cortisone into the hip and the knee. We have reviewed the note from them and they think his pain is multifactorial coming from his back his hip and his knee on the right side. They injected the hip and the knee and the patient says he's still having the pain which is sometimes burning and numb this in nature on top of the foot and lateral leg and hip. He has seen a neurosurgeon in the past regarding his back problems. He is also having problems with his right groin area and this is very tender and very sensitive to touch.      Patient Active Problem List   Diagnosis Date Noted  . BPH (benign prostatic hypertrophy) 12/26/2014  . Metabolic syndrome 09/28/2013  . Multiple drug allergies 09/28/2013  . Statin intolerance 09/28/2013  . Nocturia 07/25/2013  . Rash and nonspecific skin eruption 05/15/2013  . Morbid obesity (HCC) 05/04/2013  . Staphylococcus aureus septicemia, history of 05/04/2013  . Septic arthritis of multiple joints, history of 05/02/2013  . Hyperlipemia 12/28/2012  .  Hypertension 12/28/2012  . Vitamin D deficiency 12/28/2012   Outpatient Encounter Prescriptions as of 05/08/2015  Medication Sig  . acetaminophen (TYLENOL) 325 MG tablet Take 650 mg by mouth every 6 (six) hours as needed.  . cyclobenzaprine (FLEXERIL) 10 MG tablet Take 1 tablet (10 mg total) by mouth 3 (three) times daily as needed for muscle spasms.  Marland Kitchen EPINEPHrine (EPI-PEN) 0.3 mg/0.3 mL SOAJ injection Inject 0.3 mLs (0.3 mg total) into the muscle once.  . Naproxen Sodium 220 MG CAPS Take 2 capsules by mouth 2 (two) times daily as needed.  Marland Kitchen oxyCODONE-acetaminophen (ROXICET) 5-325 MG tablet Take 1 tablet by mouth every 8 (eight) hours as needed for severe pain.  . ramipril (ALTACE) 2.5 MG capsule Take 1 capsule (2.5 mg total) by mouth daily.   No facility-administered encounter medications on file as of 05/08/2015.     Review of Systems  Constitutional: Negative.   HENT: Negative.   Eyes: Negative.   Respiratory: Negative.   Cardiovascular: Negative.   Gastrointestinal: Negative.        Lower right abd pain   Endocrine: Negative.   Genitourinary: Negative.   Musculoskeletal: Positive for arthralgias (right knee and hip pain).  Skin: Negative.   Allergic/Immunologic: Negative.   Neurological: Negative.   Hematological: Negative.   Psychiatric/Behavioral: Negative.  Objective:   Physical Exam  Constitutional: He is oriented to person, place, and time. He appears well-developed and well-nourished. He appears distressed.  HENT:  Head: Normocephalic.  Eyes: Conjunctivae and EOM are normal. Pupils are equal, round, and reactive to light. Right eye exhibits no discharge. Left eye exhibits no discharge. No scleral icterus.  Neck: Normal range of motion. Neck supple.  Abdominal: Soft. He exhibits no mass. There is tenderness. There is guarding. There is no rebound.  Genitourinary:  There was not any kind of bulging in the right inguinal or the left inguinal areas. However  palpating in the right groin area was extremely tender to pain. The patient is obese and no obvious bulging was observed with standing or with being supine.  Musculoskeletal: He exhibits tenderness. He exhibits no edema.  Decreased range of motion and patient actually on a walker into the visit today. The pain grabs him with certain movements  Neurological: He is alert and oriented to person, place, and time.  The neuropathy appears to be in the L4-L5 area.  Skin: Skin is warm and dry. No rash noted.  Psychiatric: He has a normal mood and affect. His behavior is normal. Thought content normal.  Nursing note and vitals reviewed.   BP 153/94 mmHg  Pulse 73  Temp(Src) 97.8 F (36.6 C) (Oral)  Ht 5\' 11"  (1.803 m)  Wt 313 lb (141.976 kg)  BMI 43.67 kg/m2       Assessment & Plan:  1. Right knee pain -The patient has recently had an injection of cortisone in the knee and no further treatment will be rendered for this today. - Sedimentation rate - CBC with Differential/Platelet - Uric acid  2. Right hip pain -He is also had an injection in the right hip - Sedimentation rate - CBC with Differential/Platelet - Uric acid  3. Neuropathy (HCC) -The patient will call the neurosurgeon as scheduled for a follow-up visit with him because he is aware of the patient's condition  4. Right lower quadrant abdominal pain -We will get a CT scan of the abdomen to make sure there is not a hernia that we are missing in the right lower quadrant.  5. Essential hypertension -The patient is in a lot of pain today and we will not make any changes with his blood pressure medicine. He will be encouraged to monitor blood pressure at home more closely. He will continue to watch his sodium intake.  Meds ordered this encounter  Medications  . oxyCODONE-acetaminophen (ROXICET) 5-325 MG tablet    Sig: Take 1 tablet by mouth every 8 (eight) hours as needed for severe pain.    Dispense:  18 tablet    Refill:   0   Patient Instructions  We will arrange for the patient to have a CT scan of the abdomen to make sure there is not a direct hernia that is not palpable. The patient will call and make arrangements to see his neurosurgeon He will also get lab work today we will call with these results as soon as they become available   Nyra Capeson W. Velmer Broadfoot MD

## 2015-05-08 NOTE — Patient Instructions (Signed)
We will arrange for the patient to have a CT scan of the abdomen to make sure there is not a direct hernia that is not palpable. The patient will call and make arrangements to see his neurosurgeon He will also get lab work today we will call with these results as soon as they become available

## 2015-05-09 LAB — CBC WITH DIFFERENTIAL/PLATELET
Basophils Absolute: 0.1 10*3/uL (ref 0.0–0.2)
Basos: 1 %
EOS (ABSOLUTE): 0.4 10*3/uL (ref 0.0–0.4)
EOS: 4 %
HEMATOCRIT: 49.2 % (ref 37.5–51.0)
HEMOGLOBIN: 16.3 g/dL (ref 12.6–17.7)
IMMATURE GRANS (ABS): 0 10*3/uL (ref 0.0–0.1)
IMMATURE GRANULOCYTES: 0 %
LYMPHS ABS: 2 10*3/uL (ref 0.7–3.1)
LYMPHS: 22 %
MCH: 28.7 pg (ref 26.6–33.0)
MCHC: 33.1 g/dL (ref 31.5–35.7)
MCV: 87 fL (ref 79–97)
MONOCYTES: 6 %
Monocytes Absolute: 0.6 10*3/uL (ref 0.1–0.9)
Neutrophils Absolute: 6.2 10*3/uL (ref 1.4–7.0)
Neutrophils: 67 %
Platelets: 251 10*3/uL (ref 150–379)
RBC: 5.68 x10E6/uL (ref 4.14–5.80)
RDW: 14.4 % (ref 12.3–15.4)
WBC: 9.3 10*3/uL (ref 3.4–10.8)

## 2015-05-09 LAB — SEDIMENTATION RATE: Sed Rate: 5 mm/hr (ref 0–30)

## 2015-05-09 LAB — URIC ACID: Uric Acid: 8.1 mg/dL (ref 3.7–8.6)

## 2015-05-10 ENCOUNTER — Telehealth: Payer: Self-pay | Admitting: *Deleted

## 2015-05-10 ENCOUNTER — Telehealth: Payer: Self-pay | Admitting: Family Medicine

## 2015-05-10 MED ORDER — OXYCODONE-ACETAMINOPHEN 5-325 MG PO TABS: 1.0000 | ORAL_TABLET | Freq: Three times a day (TID) | ORAL | Status: DC | PRN

## 2015-05-10 NOTE — Telephone Encounter (Signed)
Dup encounter.

## 2015-05-10 NOTE — Telephone Encounter (Signed)
Pt states that CT appt is next Tuesday at 3:15 He will run out of pain med on Tuesday night  He does not want to see Dr Danielle DessElsner until after CT and it may be awhile before he gets the appt. Dr Greig RightMurphy's office will not write the chronic pain meds - he would refer to pain mgmt.- -this could take about a month to get in.  Can we write for more pain meds on Tuesday?  We last gave him the #18 on 05/08/15 (these will run out Tuesday night--- at 3 a day)  He states that it so too unbearable without the med and 3 / day is the least he can stand to take.

## 2015-05-10 NOTE — Telephone Encounter (Signed)
Please give patient one refill on his pain medication

## 2015-05-14 ENCOUNTER — Ambulatory Visit
Admission: RE | Admit: 2015-05-14 | Discharge: 2015-05-14 | Disposition: A | Discharge status: Home / Self Care | Payer: Medicare Other | Source: Ambulatory Visit | Attending: Family Medicine | Admitting: Family Medicine

## 2015-05-14 DIAGNOSIS — R1030 Lower abdominal pain, unspecified: Secondary | ICD-10-CM | POA: Diagnosis not present

## 2015-05-14 DIAGNOSIS — K469 Unspecified abdominal hernia without obstruction or gangrene: Secondary | ICD-10-CM

## 2015-05-14 MED ORDER — IOPAMIDOL (ISOVUE-300) INJECTION 61%
125.0000 mL | Freq: Once | INTRAVENOUS | Status: AC | PRN
  Administered 2015-05-14: 125 mL via INTRAVENOUS

## 2015-05-15 ENCOUNTER — Telehealth: Payer: Self-pay | Admitting: Family Medicine

## 2015-05-15 NOTE — Telephone Encounter (Signed)
Stp and reviewed results. Pt states he doesn't need referral for neurosurgeon. Report printed and mailed to pt as requested.

## 2015-05-17 DIAGNOSIS — M25561 Pain in right knee: Secondary | ICD-10-CM | POA: Diagnosis not present

## 2015-05-27 DIAGNOSIS — M25561 Pain in right knee: Secondary | ICD-10-CM | POA: Diagnosis not present

## 2015-06-03 DIAGNOSIS — M25561 Pain in right knee: Secondary | ICD-10-CM | POA: Diagnosis not present

## 2015-06-03 DIAGNOSIS — M545 Low back pain: Secondary | ICD-10-CM | POA: Diagnosis not present

## 2015-06-10 ENCOUNTER — Ambulatory Visit (INDEPENDENT_AMBULATORY_CARE_PROVIDER_SITE_OTHER): Payer: Medicare Other | Admitting: Family Medicine

## 2015-06-10 ENCOUNTER — Ambulatory Visit (INDEPENDENT_AMBULATORY_CARE_PROVIDER_SITE_OTHER): Payer: Medicare Other

## 2015-06-10 ENCOUNTER — Encounter: Payer: Self-pay | Admitting: Family Medicine

## 2015-06-10 VITALS — BP 143/95 | HR 89 | Temp 97.8°F | Ht 71.0 in | Wt 312.0 lb

## 2015-06-10 DIAGNOSIS — R351 Nocturia: Secondary | ICD-10-CM | POA: Diagnosis not present

## 2015-06-10 DIAGNOSIS — I10 Essential (primary) hypertension: Secondary | ICD-10-CM

## 2015-06-10 DIAGNOSIS — N400 Enlarged prostate without lower urinary tract symptoms: Secondary | ICD-10-CM

## 2015-06-10 DIAGNOSIS — G629 Polyneuropathy, unspecified: Secondary | ICD-10-CM | POA: Diagnosis not present

## 2015-06-10 DIAGNOSIS — M5441 Lumbago with sciatica, right side: Secondary | ICD-10-CM | POA: Diagnosis not present

## 2015-06-10 DIAGNOSIS — E785 Hyperlipidemia, unspecified: Secondary | ICD-10-CM | POA: Diagnosis not present

## 2015-06-10 DIAGNOSIS — E559 Vitamin D deficiency, unspecified: Secondary | ICD-10-CM

## 2015-06-10 DIAGNOSIS — E8881 Metabolic syndrome: Secondary | ICD-10-CM

## 2015-06-10 DIAGNOSIS — M2550 Pain in unspecified joint: Secondary | ICD-10-CM | POA: Diagnosis not present

## 2015-06-10 LAB — POCT UA - MICROSCOPIC ONLY
BACTERIA, U MICROSCOPIC: NEGATIVE
CASTS, UR, LPF, POC: NEGATIVE
Crystals, Ur, HPF, POC: NEGATIVE
Mucus, UA: NEGATIVE
Yeast, UA: NEGATIVE

## 2015-06-10 LAB — POCT URINALYSIS DIPSTICK
Bilirubin, UA: NEGATIVE
Glucose, UA: NEGATIVE
KETONES UA: NEGATIVE
NITRITE UA: NEGATIVE
PH UA: 5
PROTEIN UA: NEGATIVE
Spec Grav, UA: 1.025
UROBILINOGEN UA: NEGATIVE

## 2015-06-10 MED ORDER — RAMIPRIL 2.5 MG PO CAPS: 2.5000 mg | ORAL_CAPSULE | Freq: Every day | ORAL | Status: DC

## 2015-06-10 NOTE — Progress Notes (Signed)
Subjective:    Patient ID: Seth Martinez, male    DOB: May 13, 1951, 64 y.o.   MRN: [id number]  HPI Pt here for follow up and management of chronic medical problems which includes hypertension. He is taking medications regularly. The patient is not sleeping well and he thinks this is due to his knee pain and nocturia. The patient is due to get a flu a Prevnar vaccine but he refuses to take these. He is due to get a chest x-ray return an FOBT and get his lab work and a urinalysis today. The patient was having a lot of abdominal and right hip pain and he recently had a CT scan and this will be reviewed with him during the visit today. It basically showed extensive degenerative changes throughout the thoracic or lumbar spine with decompression surgery being apparent and the lower lumbar spine. The radiologist felt like he has significant neural foraminal narrowings throughout the mid to lower lumbar spine with the right being worse than the left with possible nerve root impingement and this was felt to be a possible source for his groin pain. There was no evidence of any abdominal or pelvic abnormality and no inguinal hernia was present. There was a suggestion of mild fatty infiltration of the liver and some small hyperdense masses within the spleen which were felt to be hemangiomas but they recommended a follow-up CT scan in 2-3 months to ensure stability. The patient denies chest pain shortness of breath trouble swallowing and heartburn indigestion and nausea vomiting diarrhea or blood in the urine. He does have nocturia and we will be checking a urine because of this today. His biggest issues still remain his knee pain and his back pain and a neuropathy associated with the back pain. He is being followed by the neurosurgeon and orthopedic surgeon. He is also concerned with his short-term memory. We will get a Mini-Mental status exam on him today.      Patient Active Problem List   Diagnosis Date  Noted  . BPH (benign prostatic hypertrophy) 12/26/2014  . Metabolic syndrome 19/16/6060  . Multiple drug allergies 09/28/2013  . Statin intolerance 09/28/2013  . Nocturia 07/25/2013  . Rash and nonspecific skin eruption 05/15/2013  . Morbid obesity ([place]) 05/04/2013  . Staphylococcus aureus septicemia, history of 05/04/2013  . Septic arthritis of multiple joints, history of 05/02/2013  . Hyperlipemia 12/28/2012  . Hypertension 12/28/2012  . Vitamin D deficiency 12/28/2012   Outpatient Encounter Prescriptions as of 06/10/2015  Medication Sig  . acetaminophen (TYLENOL) 325 MG tablet Take 650 mg by mouth every 6 (six) hours as needed.  . Aspirin-Acetaminophen-Caffeine (EXCEDRIN PO) Take by mouth. PRN  . cyclobenzaprine (FLEXERIL) 10 MG tablet Take 1 tablet (10 mg total) by mouth 3 (three) times daily as needed for muscle spasms.  Marland Kitchen EPINEPHrine (EPI-PEN) 0.3 mg/0.3 mL SOAJ injection Inject 0.3 mLs (0.3 mg total) into the muscle once.  Marland Kitchen oxyCODONE-acetaminophen (ROXICET) 5-325 MG tablet Take 1 tablet by mouth every 8 (eight) hours as needed for severe pain. Do not fill until 05/14/15.  . ramipril (ALTACE) 2.5 MG capsule Take 1 capsule (2.5 mg total) by mouth daily.  . [DISCONTINUED] Naproxen Sodium 220 MG CAPS Take 2 capsules by mouth 2 (two) times daily as needed.   No facility-administered encounter medications on file as of 06/10/2015.      Review of Systems  Constitutional: Negative.        Not sleeping well due to urine and knee pain  HENT: Negative.   Eyes: Negative.   Respiratory: Negative.   Cardiovascular: Negative.   Gastrointestinal: Negative.   Endocrine: Negative.   Genitourinary: Positive for frequency (nocturia).  Musculoskeletal: Positive for arthralgias (right knee pain).  Skin: Negative.   Allergic/Immunologic: Negative.   Neurological: Negative.   Hematological: Negative.   Psychiatric/Behavioral: Negative.        Objective:   Physical Exam    Constitutional: He is oriented to person, place, and time. He appears well-developed and well-nourished. No distress.  HENT:  Head: Normocephalic and atraumatic.  Right Ear: External ear normal.  Left Ear: External ear normal.  Nose: Nose normal.  Mouth/Throat: Oropharynx is clear and moist. No oropharyngeal exudate.  Eyes: Conjunctivae and EOM are normal. Pupils are equal, round, and reactive to light. Right eye exhibits no discharge. Left eye exhibits no discharge. No scleral icterus.  Neck: Normal range of motion. Neck supple. No thyromegaly present.  Noted bruits or adenopathy  Cardiovascular: Normal rate, regular rhythm and normal heart sounds.   No murmur heard. The rhythm is regular at 72/m  Pulmonary/Chest: Effort normal and breath sounds normal. No respiratory distress. He has no wheezes. He has no rales. He exhibits no tenderness.  Clear anteriorly and posteriorly  Abdominal: Soft. Bowel sounds are normal. He exhibits no mass. There is no tenderness. There is no rebound and no guarding.  Obesity without masses tenderness or organ enlargement  Musculoskeletal: Normal range of motion. He exhibits no edema or tenderness.  The patient has pain at bilateral joint line of the right knee. He also has pain with arising from a sitting position and going to a supine position. This is mostly in his back.  Lymphadenopathy:    He has no cervical adenopathy.  Neurological: He is alert and oriented to person, place, and time. He has normal reflexes. No cranial nerve deficit.  Skin: Skin is warm and dry. No rash noted.  Psychiatric: He has a normal mood and affect. His behavior is normal. Judgment and thought content normal.  Nursing note and vitals reviewed.   BP 152/100 mmHg  Pulse 89  Temp(Src) 97.8 F (36.6 C) (Oral)  Ht '5\' 11"'  (1.803 m)  Wt 312 lb (141.522 kg)  BMI 43.53 kg/m2  [facility] reading (PRIMARY) by  Dr. Brunilda Payor x-ray no active lung disease. There is a significant amount  of degenerative changes in the thoracic spine.                                   MMSE--- 30 out of 30     Assessment & Plan:  1. Essential hypertension -Is elevated today. This will be rechecked before he leaves office. - DG Chest 2 View; Future - BMP8+EGFR - CBC with Differential/Platelet - Hepatic function panel  2. Hyperlipemia -The patient must and I emphasized must do better with his diet and try to lose weight as he is intolerant of statin drugs. - CBC with Differential/Platelet - Lipid panel  3. Vitamin D deficiency -We will check his level of vitamin D to see his current status with this. - CBC with Differential/Platelet - VITAMIN D 25 Hydroxy (Vit-D Deficiency, Fractures)  4. Metabolic syndrome -Continue to exercise as much as possible and make all efforts at losing weight - BMP8+EGFR - CBC with Differential/Platelet  5. BPH (benign prostatic hypertrophy) -The patient is having nocturia so we will check a urine and get back with him  with the results - CBC with Differential/Platelet  6. Nocturia -Continue to drink plenty of fluids - POCT urinalysis dipstick - POCT UA - Microscopic Only - Urine culture  7. Arthralgia -Follow-up with neurosurgery and orthopedics - Sedimentation rate  8. Neuropathy ([place]) -Follow-up with neurosurgery  9. Morbid obesity due to excess calories ([place]) -Continue to work aggressively with diet  10. Low back pain with right-sided sciatica, unspecified back pain laterality -Follow-up with neurosurgery  Patient Instructions                       Medicare Annual Wellness Visit  [facility] and the medical providers at [facility] strive to bring you the best medical care.  In doing so we not only want to address your current medical conditions and concerns but also to detect new conditions early and prevent illness, disease and health-related problems.    Medicare offers a yearly Wellness Visit which allows  our clinical staff to assess your need for preventative services including immunizations, lifestyle education, counseling to decrease risk of preventable diseases and screening for fall risk and other medical concerns.    This visit is provided free of charge (no copay) for all Medicare recipients. The clinical pharmacists at [facility] have begun to conduct these Wellness Visits which will also include a thorough review of all your medications.    As you primary medical provider recommend that you make an appointment for your Annual Wellness Visit if you have not done so already this year.  You may set up this appointment before you leave today or you may call back [phone]) and schedule an appointment.  Please make sure when you call that you mention that you are scheduling your Annual Wellness Visit with the clinical pharmacist so that the appointment may be made for the proper length of time.    Continue current medications. Continue good therapeutic lifestyle changes which include good diet and exercise. Fall precautions discussed with patient. If an FOBT was given today- please return it to our front desk. If you are over 72 years old - you may need Prevnar 85 or the adult Pneumonia vaccine.  **Flu shots are available--- please call and schedule a FLU-CLINIC appointment**  After your visit with [place] today you will receive a survey in the mail or online from [employer] regarding your care with [place]. Please take a moment to fill this out. Your feedback is very important to [place] as you can help [place] better understand your patient needs as well as improve your experience and satisfaction. WE CARE ABOUT YOU!!!   Follow-up with neurosurgeon and orthopedics as planned We will consider medication for the short-term memory if there are no other objective reasons found with the blood work Continue to work on losing weight Do not forget that you do need to get a colonoscopy We will  call you with the urinalysis results as soon as that becomes available as well as the lab work results.   Arrie Senate MD

## 2015-06-10 NOTE — Patient Instructions (Addendum)
Medicare Annual Wellness Visit  [facility] and the medical providers at [facility] strive to bring you the best medical care.  In doing so we not only want to address your current medical conditions and concerns but also to detect new conditions early and prevent illness, disease and health-related problems.    Medicare offers a yearly Wellness Visit which allows our clinical staff to assess your need for preventative services including immunizations, lifestyle education, counseling to decrease risk of preventable diseases and screening for fall risk and other medical concerns.    This visit is provided free of charge (no copay) for all Medicare recipients. The clinical pharmacists at [facility] have begun to conduct these Wellness Visits which will also include a thorough review of all your medications.    As you primary medical provider recommend that you make an appointment for your Annual Wellness Visit if you have not done so already this year.  You may set up this appointment before you leave today or you may call back [phone] and schedule an appointment.  Please make sure when you call that you mention that you are scheduling your Annual Wellness Visit with the clinical pharmacist so that the appointment may be made for the proper length of time.    Continue current medications. Continue good therapeutic lifestyle changes which include good diet and exercise. Fall precautions discussed with patient. If an FOBT was given today- please return it to our front desk. If you are over 66 years old - you may need Prevnar 13 or the adult Pneumonia vaccine.  **Flu shots are available--- please call and schedule a FLU-CLINIC appointment**  After your visit with us today you will receive a survey in the mail or online from [employer] regarding your care with us. Please take a moment to fill this out. Your feedback is very  important to us as you can help us better understand your patient needs as well as improve your experience and satisfaction. WE CARE ABOUT YOU!!!   Follow-up with neurosurgeon and orthopedics as planned We will consider medication for the short-term memory if there are no other objective reasons found with the blood work Continue to work on losing weight Do not forget that you do need to get a colonoscopy We will call you with the urinalysis results as soon as that becomes available as well as the lab work results.

## 2015-06-10 NOTE — Progress Notes (Signed)
MMSE score is 30 out of 30. Questions are in epic

## 2015-06-11 LAB — HEPATIC FUNCTION PANEL
ALBUMIN: 4.6 g/dL (ref 3.6–4.8)
ALT: 27 IU/L (ref 0–44)
AST: 30 IU/L (ref 0–40)
Alkaline Phosphatase: 65 IU/L (ref 39–117)
BILIRUBIN TOTAL: 0.7 mg/dL (ref 0.0–1.2)
Bilirubin, Direct: 0.19 mg/dL (ref 0.00–0.40)
TOTAL PROTEIN: 7.4 g/dL (ref 6.0–8.5)

## 2015-06-11 LAB — CBC WITH DIFFERENTIAL/PLATELET
BASOS ABS: 0.1 10*3/uL (ref 0.0–0.2)
Basos: 1 %
EOS (ABSOLUTE): 0.3 10*3/uL (ref 0.0–0.4)
EOS: 6 %
HEMOGLOBIN: 16.9 g/dL (ref 12.6–17.7)
Hematocrit: 48.7 % (ref 37.5–51.0)
IMMATURE GRANULOCYTES: 0 %
Immature Grans (Abs): 0 10*3/uL (ref 0.0–0.1)
LYMPHS ABS: 1.8 10*3/uL (ref 0.7–3.1)
Lymphs: 29 %
MCH: 30.2 pg (ref 26.6–33.0)
MCHC: 34.7 g/dL (ref 31.5–35.7)
MCV: 87 fL (ref 79–97)
MONOCYTES: 7 %
MONOS ABS: 0.4 10*3/uL (ref 0.1–0.9)
NEUTROS PCT: 57 %
Neutrophils Absolute: 3.6 10*3/uL (ref 1.4–7.0)
Platelets: 279 10*3/uL (ref 150–379)
RBC: 5.6 x10E6/uL (ref 4.14–5.80)
RDW: 14.8 % (ref 12.3–15.4)
WBC: 6.2 10*3/uL (ref 3.4–10.8)

## 2015-06-11 LAB — BMP8+EGFR
BUN / CREAT RATIO: 14 (ref 10–22)
BUN: 15 mg/dL (ref 8–27)
CHLORIDE: 99 mmol/L (ref 97–106)
CO2: 21 mmol/L (ref 18–29)
CREATININE: 1.11 mg/dL (ref 0.76–1.27)
Calcium: 9.7 mg/dL (ref 8.6–10.2)
GFR calc Af Amer: 80 mL/min/{1.73_m2} (ref >59)
GFR calc non Af Amer: 69 mL/min/{1.73_m2} (ref >59)
GLUCOSE: 113 mg/dL — AB (ref 65–99)
Potassium: 4.2 mmol/L (ref 3.5–5.2)
Sodium: 139 mmol/L (ref 136–144)

## 2015-06-11 LAB — SEDIMENTATION RATE: Sed Rate: 15 mm/hr (ref 0–30)

## 2015-06-11 LAB — VITAMIN D 25 HYDROXY (VIT D DEFICIENCY, FRACTURES): VIT D 25 HYDROXY: 28.2 ng/mL — AB (ref 30.0–100.0)

## 2015-06-11 LAB — LIPID PANEL
CHOL/HDL RATIO: 4.2 ratio (ref 0.0–5.0)
Cholesterol, Total: 181 mg/dL (ref 100–199)
HDL: 43 mg/dL (ref >39)
LDL CALC: 94 mg/dL (ref 0–99)
TRIGLYCERIDES: 219 mg/dL — AB (ref 0–149)
VLDL Cholesterol Cal: 44 mg/dL — ABNORMAL HIGH (ref 5–40)

## 2015-06-12 LAB — URINE CULTURE: ORGANISM ID, BACTERIA: NO GROWTH

## 2015-06-17 ENCOUNTER — Other Ambulatory Visit (HOSPITAL_COMMUNITY): Payer: Self-pay | Admitting: Neurological Surgery

## 2015-06-17 ENCOUNTER — Other Ambulatory Visit: Payer: Self-pay | Admitting: Neurological Surgery

## 2015-06-17 DIAGNOSIS — M419 Scoliosis, unspecified: Secondary | ICD-10-CM

## 2015-06-26 ENCOUNTER — Telehealth: Payer: Self-pay | Admitting: Family Medicine

## 2015-06-26 NOTE — Telephone Encounter (Signed)
Please give this patient a refill on his Z-Pak and do one prescription for Percocet that he will have to pick up

## 2015-06-27 ENCOUNTER — Ambulatory Visit (HOSPITAL_COMMUNITY): Payer: Medicare Other

## 2015-06-27 ENCOUNTER — Ambulatory Visit (HOSPITAL_COMMUNITY)
Admission: RE | Admit: 2015-06-27 | Discharge: 2015-06-27 | Disposition: A | Discharge status: Home / Self Care | Payer: Medicare Other | Source: Ambulatory Visit | Attending: Neurological Surgery | Admitting: Neurological Surgery

## 2015-06-27 ENCOUNTER — Other Ambulatory Visit: Payer: Self-pay | Admitting: Family Medicine

## 2015-06-27 MED ORDER — OXYCODONE-ACETAMINOPHEN 5-325 MG PO TABS: 1.0000 | ORAL_TABLET | Freq: Three times a day (TID) | ORAL | Status: DC | PRN

## 2015-06-27 MED ORDER — AZITHROMYCIN 250 MG PO TABS: ORAL_TABLET | ORAL | Status: DC

## 2015-07-04 ENCOUNTER — Encounter: Payer: Medicare Other | Admitting: Neurology

## 2015-07-09 DIAGNOSIS — J069 Acute upper respiratory infection, unspecified: Secondary | ICD-10-CM | POA: Diagnosis not present

## 2015-07-16 ENCOUNTER — Encounter: Payer: Self-pay | Admitting: Physician Assistant

## 2015-07-16 ENCOUNTER — Ambulatory Visit (INDEPENDENT_AMBULATORY_CARE_PROVIDER_SITE_OTHER): Payer: Medicare Other | Admitting: Physician Assistant

## 2015-07-16 ENCOUNTER — Ambulatory Visit (INDEPENDENT_AMBULATORY_CARE_PROVIDER_SITE_OTHER): Payer: Medicare Other

## 2015-07-16 VITALS — BP 150/104 | HR 101 | Temp 97.7°F | Ht 71.0 in | Wt 314.4 lb

## 2015-07-16 DIAGNOSIS — J069 Acute upper respiratory infection, unspecified: Secondary | ICD-10-CM | POA: Diagnosis not present

## 2015-07-16 MED ORDER — LEVOFLOXACIN 500 MG PO TABS: 500.0000 mg | ORAL_TABLET | Freq: Every day | ORAL | Status: DC

## 2015-07-16 NOTE — Progress Notes (Signed)
Subjective:     Patient ID: Seth Martinez, male   DOB: 09-27-1950, 64 y.o.   MRN: [id number]  HPI Pt with cough and congestion He had a Zpack at home so he took a complete course Pt then went on vacation and was not feeling well He was eval at an Urgent Care and placed on another Zpack and given Alb Inh He has finished this pack as well but still with cough and sweats  Review of Systems  Constitutional: Positive for chills, diaphoresis, activity change, appetite change and fatigue.  HENT: Positive for congestion, postnasal drip and sinus pressure.   Respiratory: Positive for cough and wheezing. Negative for shortness of breath.   Cardiovascular: Negative.        Objective:   Physical Exam  Constitutional: He appears well-developed and well-nourished.  HENT:  Right Ear: External ear normal.  Left Ear: External ear normal.  Mouth/Throat: Oropharynx is clear and moist. No oropharyngeal exudate.  Neck: Normal range of motion. Neck supple. No JVD present.  Cardiovascular: Normal rate, regular rhythm and normal heart sounds.   Pulmonary/Chest: Breath sounds normal. He has no wheezes. He has no rales. He exhibits no tenderness.  Lymphadenopathy:    He has no cervical adenopathy.  Nursing note and vitals reviewed. CXR- neg infil     Assessment:     URI    Plan:     Fluids Rest Levaquin F/U with regular provider in 2 weeks

## 2015-07-16 NOTE — Patient Instructions (Signed)
Upper Respiratory Infection, Adult Most upper respiratory infections (URIs) are a viral infection of the air passages leading to the lungs. A URI affects the nose, throat, and upper air passages. The most common type of URI is nasopharyngitis and is typically referred to as "the common cold." URIs run their course and usually go away on their own. Most of the time, a URI does not require medical attention, but sometimes a bacterial infection in the upper airways can follow a viral infection. This is called a secondary infection. Sinus and middle ear infections are common types of secondary upper respiratory infections. Bacterial pneumonia can also complicate a URI. A URI can worsen asthma and chronic obstructive pulmonary disease (COPD). Sometimes, these complications can require emergency medical care and may be life threatening.  CAUSES Almost all URIs are caused by viruses. A virus is a type of germ and can spread from one person to another.  RISKS FACTORS You may be at risk for a URI if:   You smoke.   You have chronic heart or lung disease.  You have a weakened defense (immune) system.   You are very young or very old.   You have nasal allergies or asthma.  You work in crowded or poorly ventilated areas.  You work in health care facilities or schools. SIGNS AND SYMPTOMS  Symptoms typically develop 2-3 days after you come in contact with a cold virus. Most viral URIs last 7-10 days. However, viral URIs from the influenza virus (flu virus) can last 14-18 days and are typically more severe. Symptoms may include:   Runny or stuffy (congested) nose.   Sneezing.   Cough.   Sore throat.   Headache.   Fatigue.   Fever.   Loss of appetite.   Pain in your forehead, behind your eyes, and over your cheekbones (sinus pain).  Muscle aches.  DIAGNOSIS  Your health care provider may diagnose a URI by:  Physical exam.  Tests to check that your symptoms are not due to  another condition such as:  Strep throat.  Sinusitis.  Pneumonia.  Asthma. TREATMENT  A URI goes away on its own with time. It cannot be cured with medicines, but medicines may be prescribed or recommended to relieve symptoms. Medicines may help:  Reduce your fever.  Reduce your cough.  Relieve nasal congestion. HOME CARE INSTRUCTIONS   Take medicines only as directed by your health care provider.   Gargle warm saltwater or take cough drops to comfort your throat as directed by your health care provider.  Use a warm mist humidifier or inhale steam from a shower to increase air moisture. This may make it easier to breathe.  Drink enough fluid to keep your urine clear or pale yellow.   Eat soups and other clear broths and maintain good nutrition.   Rest as needed.   Return to work when your temperature has returned to normal or as your health care provider advises. You may need to stay home longer to avoid infecting others. You can also use a face mask and careful hand washing to prevent spread of the virus.  Increase the usage of your inhaler if you have asthma.   Do not use any tobacco products, including cigarettes, chewing tobacco, or electronic cigarettes. If you need help quitting, ask your health care provider. PREVENTION  The best way to protect yourself from getting a cold is to practice good hygiene.   Avoid oral or hand contact with people with cold   symptoms.   Wash your hands often if contact occurs.  There is no clear evidence that vitamin C, vitamin E, echinacea, or exercise reduces the chance of developing a cold. However, it is always recommended to get plenty of rest, exercise, and practice good nutrition.  SEEK MEDICAL CARE IF:   You are getting worse rather than better.   Your symptoms are not controlled by medicine.   You have chills.  You have worsening shortness of breath.  You have brown or red mucus.  You have yellow or brown nasal  discharge.  You have pain in your face, especially when you bend forward.  You have a fever.  You have swollen neck glands.  You have pain while swallowing.  You have white areas in the back of your throat. SEEK IMMEDIATE MEDICAL CARE IF:   You have severe or persistent:  Headache.  Ear pain.  Sinus pain.  Chest pain.  You have chronic lung disease and any of the following:  Wheezing.  Prolonged cough.  Coughing up blood.  A change in your usual mucus.  You have a stiff neck.  You have changes in your:  Vision.  Hearing.  Thinking.  Mood. MAKE SURE YOU:   Understand these instructions.  Will watch your condition.  Will get help right away if you are not doing well or get worse.   This information is not intended to replace advice given to you by your health care provider. Make sure you discuss any questions you have with your health care provider.   Document Released: 12/30/2000 Document Revised: 11/20/2014 Document Reviewed: 10/11/2013 Elsevier Interactive Patient Education 2016 [employer]  

## 2015-07-25 ENCOUNTER — Ambulatory Visit (HOSPITAL_COMMUNITY): Admission: RE | Admit: 2015-07-25 | Payer: Medicare Other | Source: Ambulatory Visit

## 2015-07-25 ENCOUNTER — Ambulatory Visit (HOSPITAL_COMMUNITY): Payer: Medicare Other

## 2015-08-13 ENCOUNTER — Telehealth: Payer: Self-pay

## 2015-08-13 ENCOUNTER — Other Ambulatory Visit: Payer: Self-pay | Admitting: Family Medicine

## 2015-08-13 DIAGNOSIS — R229 Localized swelling, mass and lump, unspecified: Principal | ICD-10-CM

## 2015-08-13 DIAGNOSIS — IMO0002 Reserved for concepts with insufficient information to code with codable children: Secondary | ICD-10-CM

## 2015-08-20 ENCOUNTER — Ambulatory Visit (HOSPITAL_COMMUNITY): Admission: RE | Admit: 2015-08-20 | Payer: Medicare Other | Source: Ambulatory Visit

## 2015-10-30 ENCOUNTER — Encounter: Payer: Self-pay | Admitting: Family Medicine

## 2015-10-30 ENCOUNTER — Ambulatory Visit (INDEPENDENT_AMBULATORY_CARE_PROVIDER_SITE_OTHER): Payer: Medicare Other | Admitting: Family Medicine

## 2015-10-30 VITALS — BP 128/79 | HR 59 | Temp 98.1°F | Ht 71.0 in | Wt 320.0 lb

## 2015-10-30 DIAGNOSIS — L739 Follicular disorder, unspecified: Secondary | ICD-10-CM

## 2015-10-30 DIAGNOSIS — N400 Enlarged prostate without lower urinary tract symptoms: Secondary | ICD-10-CM

## 2015-10-30 DIAGNOSIS — E559 Vitamin D deficiency, unspecified: Secondary | ICD-10-CM

## 2015-10-30 DIAGNOSIS — R413 Other amnesia: Secondary | ICD-10-CM | POA: Diagnosis not present

## 2015-10-30 DIAGNOSIS — E785 Hyperlipidemia, unspecified: Secondary | ICD-10-CM | POA: Diagnosis not present

## 2015-10-30 DIAGNOSIS — I10 Essential (primary) hypertension: Secondary | ICD-10-CM

## 2015-10-30 DIAGNOSIS — Z889 Allergy status to unspecified drugs, medicaments and biological substances status: Secondary | ICD-10-CM

## 2015-10-30 DIAGNOSIS — R5383 Other fatigue: Secondary | ICD-10-CM

## 2015-10-30 DIAGNOSIS — Z1211 Encounter for screening for malignant neoplasm of colon: Secondary | ICD-10-CM | POA: Diagnosis not present

## 2015-10-30 DIAGNOSIS — Z789 Other specified health status: Secondary | ICD-10-CM

## 2015-10-30 LAB — URINALYSIS, COMPLETE
BILIRUBIN UA: NEGATIVE
GLUCOSE, UA: NEGATIVE
KETONES UA: NEGATIVE
LEUKOCYTES UA: NEGATIVE
Nitrite, UA: NEGATIVE
RBC UA: NEGATIVE
SPEC GRAV UA: 1.02 (ref 1.005–1.030)
UUROB: 0.2 mg/dL (ref 0.2–1.0)
pH, UA: 5.5 (ref 5.0–7.5)

## 2015-10-30 LAB — MICROSCOPIC EXAMINATION
BACTERIA UA: NONE SEEN
Epithelial Cells (non renal): NONE SEEN /hpf (ref 0–10)
RBC, UA: NONE SEEN /hpf (ref 0–?)
WBC UA: NONE SEEN /HPF (ref 0–?)

## 2015-10-30 MED ORDER — RAMIPRIL 2.5 MG PO CAPS: 2.5000 mg | ORAL_CAPSULE | Freq: Every day | ORAL | Status: DC

## 2015-10-30 MED ORDER — MUPIROCIN 2 % EX OINT: 1.0000 "application " | TOPICAL_OINTMENT | Freq: Two times a day (BID) | CUTANEOUS | Status: DC

## 2015-10-30 NOTE — Progress Notes (Signed)
Subjective:    Patient ID: Seth Martinez, male    DOB: July 23, 1950, 65 y.o.   MRN: [id number]  HPI Pt here for follow up and management of chronic medical problems which includes hypertension and hyperlipidemia. He is not taking his BP med regularly.The patient's biggest complaint today is his fatigue. He will be given an FOBT to return and we'll get routine lab work today.      Patient Active Problem List   Diagnosis Date Noted  . BPH (benign prostatic hypertrophy) 12/26/2014  . Metabolic syndrome 02/58/5277  . Multiple drug allergies 09/28/2013  . Statin intolerance 09/28/2013  . Nocturia 07/25/2013  . Rash and nonspecific skin eruption 05/15/2013  . Morbid obesity ([place]) 05/04/2013  . Staphylococcus aureus septicemia, history of 05/04/2013  . Septic arthritis of multiple joints, history of 05/02/2013  . Hyperlipemia 12/28/2012  . Hypertension 12/28/2012  . Vitamin D deficiency 12/28/2012   Outpatient Encounter Prescriptions as of 10/30/2015  Medication Sig  . aspirin 325 MG tablet Take 325 mg by mouth daily.  Marland Kitchen oxyCODONE-acetaminophen (ROXICET) 5-325 MG tablet Take 1 tablet by mouth every 8 (eight) hours as needed for severe pain. Do not fill until 05/14/15.  . ramipril (ALTACE) 2.5 MG capsule Take 1 capsule (2.5 mg total) by mouth daily.  . Docosanol (ABREVA) 10 % CREA Apply 1 application topically 3 (three) times daily as needed (for fever blisters). Reported on 10/30/2015  . EPINEPHrine (EPI-PEN) 0.3 mg/0.3 mL SOAJ injection Inject 0.3 mLs (0.3 mg total) into the muscle once. (Patient not taking: Reported on 10/30/2015)  . [DISCONTINUED] acetaminophen (TYLENOL) 325 MG tablet Take 650 mg by mouth every 6 (six) hours as needed for moderate pain or headache.   . [DISCONTINUED] albuterol (PROVENTIL HFA;VENTOLIN HFA) 108 (90 BASE) MCG/ACT inhaler Inhale 1 puff into the lungs every 4 (four) hours as needed for wheezing or shortness of breath.  . [DISCONTINUED]  Aspirin-Acetaminophen-Caffeine (EXCEDRIN PO) Take 1 tablet by mouth daily as needed (for headaches).   . [DISCONTINUED] levofloxacin (LEVAQUIN) 500 MG tablet Take 1 tablet (500 mg total) by mouth daily.   No facility-administered encounter medications on file as of 10/30/2015.      Review of Systems  Constitutional: Positive for fatigue.  HENT: Negative.   Eyes: Negative.   Respiratory: Negative.   Cardiovascular: Negative.   Gastrointestinal: Negative.   Endocrine: Negative.   Genitourinary: Negative.   Musculoskeletal: Negative.   Skin: Negative.   Allergic/Immunologic: Negative.   Neurological: Negative.   Hematological: Negative.   Psychiatric/Behavioral: Negative.        Objective:   Physical Exam  Constitutional: He is oriented to person, place, and time. He appears well-developed and well-nourished. No distress.  HENT:  Head: Normocephalic and atraumatic.  Right Ear: External ear normal.  Left Ear: External ear normal.  Nose: Nose normal.  Mouth/Throat: Oropharynx is clear and moist. No oropharyngeal exudate.  Eyes: Conjunctivae and EOM are normal. Pupils are equal, round, and reactive to light. Right eye exhibits no discharge. Left eye exhibits no discharge. No scleral icterus.  Neck: Normal range of motion. Neck supple. No thyromegaly present.  Cardiovascular: Normal rate, regular rhythm, normal heart sounds and intact distal pulses.   No murmur heard. The rhythm is regular at 60/m  Pulmonary/Chest: Effort normal and breath sounds normal. No respiratory distress. He has no wheezes. He has no rales. He exhibits no tenderness.  Clear anteriorly and posteriorly  Abdominal: Soft. Bowel sounds are normal. He exhibits no mass. There  is no tenderness. There is no rebound and no guarding.  Morbid obesity with an area of folliculitis and his waistline  Musculoskeletal: Normal range of motion. He exhibits no edema or tenderness.  The patient has ongoing problems with back  pain and has trouble laying and sitting up due to the pain in his back  Lymphadenopathy:    He has no cervical adenopathy.  Neurological: He is alert and oriented to person, place, and time. He has normal reflexes. No cranial nerve deficit.  Skin: Skin is warm and dry. Rash noted. There is erythema.  Folliculitis and erythema at waistline  Psychiatric: He has a normal mood and affect. His behavior is normal. Judgment and thought content normal.  Nursing note and vitals reviewed.  BP 128/79 mmHg  Pulse 59  Temp(Src) 98.1 F (36.7 C) (Oral)  Ht '5\' 11"'  (1.803 m)  Wt 320 lb (145.151 kg)  BMI 44.65 kg/m2        Assessment & Plan:  1. Vitamin D deficiency -Any current treatment pending results of lab work - CBC with Differential/Platelet - VITAMIN D 25 Hydroxy (Vit-D Deficiency, Fractures)  2. Hyperlipemia -Patient remained statin intolerant and therefore will have to continue with aggressive therapeutic lifestyle changes - CBC with Differential/Platelet - NMR, lipoprofile  3. Essential hypertension -The blood pressure is good today he will continue to watch his sodium intake and take his current medication. - CBC with Differential/Platelet - BMP8+EGFR - Hepatic function panel  4. BPH (benign prostatic hypertrophy) -The patient does have some nocturia we will continue to monitor this. - CBC with Differential/Platelet  5. Special screening for malignant neoplasms, colon - Fecal occult blood, imunochemical; Future  6. Statin intolerance - Continue with aggressive therapeutic lifestyle changes  7. Multiple drug allergies -We always need to be reminded of this and were much more careful and prescribing medication for this patient.  8. Morbid obesity due to excess calories ([place]) -Continue with aggressive therapeutic lifestyle changes and persistent aching more water and staying as active as possible  9. Memory impairment -Referral to neurologist  10. Folliculitis -Keep  the waistline cleaned with peroxide and apply Bactroban ointment after cleansing  11. Other fatigue -Check thyroid and urinalysis  Meds ordered this encounter  Medications  . aspirin 325 MG tablet    Sig: Take 325 mg by mouth daily.  . ramipril (ALTACE) 2.5 MG capsule    Sig: Take 1 capsule (2.5 mg total) by mouth daily.    Dispense:  30 capsule    Refill:  11  . mupirocin ointment (BACTROBAN) 2 %    Sig: Apply 1 application topically 2 (two) times daily.    Dispense:  22 g    Refill:  0   Patient Instructions                       Medicare Annual Wellness Visit  [facility] and the medical providers at [facility] strive to bring you the best medical care.  In doing so we not only want to address your current medical conditions and concerns but also to detect new conditions early and prevent illness, disease and health-related problems.    Medicare offers a yearly Wellness Visit which allows our clinical staff to assess your need for preventative services including immunizations, lifestyle education, counseling to decrease risk of preventable diseases and screening for fall risk and other medical concerns.    This visit is provided free of charge (no  copay) for all Medicare recipients. The clinical pharmacists at [facility] have begun to conduct these Wellness Visits which will also include a thorough review of all your medications.    As you primary medical provider recommend that you make an appointment for your Annual Wellness Visit if you have not done so already this year.  You may set up this appointment before you leave today or you may call back [phone]) and schedule an appointment.  Please make sure when you call that you mention that you are scheduling your Annual Wellness Visit with the clinical pharmacist so that the appointment may be made for the proper length of time.     Continue current medications. Continue good  therapeutic lifestyle changes which include good diet and exercise. Fall precautions discussed with patient. If an FOBT was given today- please return it to our front desk. If you are over 56 years old - you may need Prevnar 48 or the adult Pneumonia vaccine.  **Flu shots are available--- please call and schedule a FLU-CLINIC appointment**  After your visit with [place] today you will receive a survey in the mail or online from [employer] regarding your care with [place]. Please take a moment to fill this out. Your feedback is very important to [place] as you can help [place] better understand your patient needs as well as improve your experience and satisfaction. WE CARE ABOUT YOU!!!   We will arrange for you to have a visit with the neurologist because of your continued concerns about your memory Continue current medications   Arrie Senate MD

## 2015-10-30 NOTE — Addendum Note (Signed)
Addended by: Magdalene RiverBULLINS, JAMIE H on: 10/30/2015 12:52 PM   Modules accepted: Orders

## 2015-10-30 NOTE — Patient Instructions (Addendum)
Medicare Annual Wellness Visit  [facility] and the medical providers at [facility] strive to bring you the best medical care.  In doing so we not only want to address your current medical conditions and concerns but also to detect new conditions early and prevent illness, disease and health-related problems.    Medicare offers a yearly Wellness Visit which allows our clinical staff to assess your need for preventative services including immunizations, lifestyle education, counseling to decrease risk of preventable diseases and screening for fall risk and other medical concerns.    This visit is provided free of charge (no copay) for all Medicare recipients. The clinical pharmacists at [facility] have begun to conduct these Wellness Visits which will also include a thorough review of all your medications.    As you primary medical provider recommend that you make an appointment for your Annual Wellness Visit if you have not done so already this year.  You may set up this appointment before you leave today or you may call back [phone] and schedule an appointment.  Please make sure when you call that you mention that you are scheduling your Annual Wellness Visit with the clinical pharmacist so that the appointment may be made for the proper length of time.     Continue current medications. Continue good therapeutic lifestyle changes which include good diet and exercise. Fall precautions discussed with patient. If an FOBT was given today- please return it to our front desk. If you are over 67 years old - you may need Prevnar 13 or the adult Pneumonia vaccine.  **Flu shots are available--- please call and schedule a FLU-CLINIC appointment**  After your visit with us today you will receive a survey in the mail or online from [employer] regarding your care with us. Please take a moment to fill this out. Your feedback is very  important to us as you can help us better understand your patient needs as well as improve your experience and satisfaction. WE CARE ABOUT YOU!!!   We will arrange for you to have a visit with the neurologist because of your continued concerns about your memory Continue current medications

## 2015-11-01 LAB — CBC WITH DIFFERENTIAL/PLATELET
BASOS ABS: 0.1 10*3/uL (ref 0.0–0.2)
Basos: 1 %
EOS (ABSOLUTE): 0.2 10*3/uL (ref 0.0–0.4)
EOS: 4 %
HEMATOCRIT: 47.1 % (ref 37.5–51.0)
HEMOGLOBIN: 16.4 g/dL (ref 12.6–17.7)
IMMATURE GRANS (ABS): 0 10*3/uL (ref 0.0–0.1)
Immature Granulocytes: 0 %
LYMPHS: 34 %
Lymphocytes Absolute: 1.8 10*3/uL (ref 0.7–3.1)
MCH: 30.3 pg (ref 26.6–33.0)
MCHC: 34.8 g/dL (ref 31.5–35.7)
MCV: 87 fL (ref 79–97)
MONOCYTES: 9 %
Monocytes Absolute: 0.5 10*3/uL (ref 0.1–0.9)
NEUTROS ABS: 2.8 10*3/uL (ref 1.4–7.0)
Neutrophils: 52 %
Platelets: 232 10*3/uL (ref 150–379)
RBC: 5.42 x10E6/uL (ref 4.14–5.80)
RDW: 14.1 % (ref 12.3–15.4)
WBC: 5.4 10*3/uL (ref 3.4–10.8)

## 2015-11-01 LAB — NMR, LIPOPROFILE
CHOLESTEROL: 179 mg/dL (ref 100–199)
HDL Cholesterol by NMR: 42 mg/dL (ref >39)
HDL PARTICLE NUMBER: 32.8 umol/L (ref >=30.5)
LDL Particle Number: 1463 nmol/L — ABNORMAL HIGH (ref <1000)
LDL SIZE: 20 nm (ref >20.5)
LDL-C: 104 mg/dL — ABNORMAL HIGH (ref 0–99)
LP-IR SCORE: 73 — AB (ref <=45)
SMALL LDL PARTICLE NUMBER: 1004 nmol/L — AB (ref <=527)
Triglycerides by NMR: 164 mg/dL — ABNORMAL HIGH (ref 0–149)

## 2015-11-01 LAB — THYROID PANEL WITH TSH
FREE THYROXINE INDEX: 1.5 (ref 1.2–4.9)
T3 Uptake Ratio: 28 % (ref 24–39)
T4 TOTAL: 5.2 ug/dL (ref 4.5–12.0)
TSH: 4.05 u[IU]/mL (ref 0.450–4.500)

## 2015-11-01 LAB — BMP8+EGFR
BUN/Creatinine Ratio: 11 (ref 10–24)
BUN: 12 mg/dL (ref 8–27)
CO2: 21 mmol/L (ref 18–29)
CREATININE: 1.14 mg/dL (ref 0.76–1.27)
Calcium: 9.4 mg/dL (ref 8.6–10.2)
Chloride: 100 mmol/L (ref 96–106)
GFR calc Af Amer: 77 mL/min/{1.73_m2} (ref >59)
GFR, EST NON AFRICAN AMERICAN: 67 mL/min/{1.73_m2} (ref >59)
GLUCOSE: 116 mg/dL — AB (ref 65–99)
Potassium: 4.3 mmol/L (ref 3.5–5.2)
Sodium: 138 mmol/L (ref 134–144)

## 2015-11-01 LAB — HEPATIC FUNCTION PANEL
ALBUMIN: 4.8 g/dL (ref 3.6–4.8)
ALK PHOS: 57 IU/L (ref 39–117)
ALT: 35 IU/L (ref 0–44)
AST: 48 IU/L — ABNORMAL HIGH (ref 0–40)
BILIRUBIN TOTAL: 0.8 mg/dL (ref 0.0–1.2)
BILIRUBIN, DIRECT: 0.21 mg/dL (ref 0.00–0.40)
TOTAL PROTEIN: 7.4 g/dL (ref 6.0–8.5)

## 2015-11-01 LAB — VITAMIN B12: VITAMIN B 12: 287 pg/mL (ref 211–946)

## 2015-11-01 LAB — VITAMIN D 25 HYDROXY (VIT D DEFICIENCY, FRACTURES): Vit D, 25-Hydroxy: 28.1 ng/mL — ABNORMAL LOW (ref 30.0–100.0)

## 2015-12-12 ENCOUNTER — Telehealth: Payer: Self-pay

## 2015-12-23 ENCOUNTER — Encounter: Payer: Self-pay | Admitting: *Deleted

## 2016-03-11 ENCOUNTER — Ambulatory Visit: Payer: Medicare Other | Admitting: Family Medicine

## 2016-03-17 ENCOUNTER — Ambulatory Visit: Payer: Medicare Other | Admitting: Family Medicine

## 2016-05-06 ENCOUNTER — Ambulatory Visit (INDEPENDENT_AMBULATORY_CARE_PROVIDER_SITE_OTHER): Payer: Medicare Other | Admitting: Family Medicine

## 2016-05-06 ENCOUNTER — Encounter: Payer: Self-pay | Admitting: Family Medicine

## 2016-05-06 VITALS — BP 139/83 | HR 64 | Temp 98.2°F | Ht 71.0 in | Wt 322.6 lb

## 2016-05-06 DIAGNOSIS — I10 Essential (primary) hypertension: Secondary | ICD-10-CM

## 2016-05-06 DIAGNOSIS — E7800 Pure hypercholesterolemia, unspecified: Secondary | ICD-10-CM

## 2016-05-06 DIAGNOSIS — E559 Vitamin D deficiency, unspecified: Secondary | ICD-10-CM

## 2016-05-06 DIAGNOSIS — G629 Polyneuropathy, unspecified: Secondary | ICD-10-CM

## 2016-05-06 DIAGNOSIS — N401 Enlarged prostate with lower urinary tract symptoms: Secondary | ICD-10-CM

## 2016-05-06 DIAGNOSIS — E119 Type 2 diabetes mellitus without complications: Secondary | ICD-10-CM

## 2016-05-06 DIAGNOSIS — Z789 Other specified health status: Secondary | ICD-10-CM

## 2016-05-06 LAB — BAYER DCA HB A1C WAIVED: HB A1C (BAYER DCA - WAIVED): 5.3 % (ref <7.0)

## 2016-05-06 NOTE — Addendum Note (Signed)
Addended by: Bearl MulberryUTHERFORD, NATALIE K on: 05/06/2016 05:02 PM   Modules accepted: Orders

## 2016-05-06 NOTE — Patient Instructions (Addendum)
Continue current medications. Continue good therapeutic lifestyle changes which include good diet and exercise. Fall precautions discussed with patient. If an FOBT was given today- please return it to our front desk. If you are over 67 years old - you may need Prevnar 13 or the adult Pneumonia vaccine.  **Flu shots are available--- please call and schedule a FLU-CLINIC appointment**  After your visit with us today you will receive a survey in the mail or online from [employer] regarding your care with us. Please take a moment to fill this out. Your feedback is very important to us as you can help us better understand your patient needs as well as improve your experience and satisfaction. WE CARE ABOUT YOU!!!   Continue to drink plenty of fluids and make every effort to lose as much weight as possible Walk and exercise regularly

## 2016-05-06 NOTE — Progress Notes (Signed)
Subjective:    Patient ID: Seth Martinez, male    DOB: [birth year], 65 y.o.   MRN: [id number]  HPI Pt is here today for a follow up of chronic medical problems which include hyperlipidemia, metabolic syndrome and hypertension.This patient has a history of chronic low back pain and morbid obesity. He continues to have his ongoing back pain and he is used to this pretty much so. He denies any chest pain or palpitations. He has had some increased shortness of breath especially the past few months with the weather being much more humid. He denies any nausea vomiting or blood in the stool. He says his bowel habits start out normal and may end up a little bit loose and this is been going on for 6 months. He has not seen any blood in the stool as I mentioned. He is passing his water slowly and has nocturia 2. His a history of BPH. He has a somewhat crazy schedule especially every other weekend with having to stay up to take his grandchildren to school because neither 1 of their parents are at home.     Patient Active Problem List   Diagnosis Date Noted  . BPH (benign prostatic hypertrophy) 12/26/2014  . Metabolic syndrome 09/28/2013  . Multiple drug allergies 09/28/2013  . Statin intolerance 09/28/2013  . Nocturia 07/25/2013  . Rash and nonspecific skin eruption 05/15/2013  . Morbid obesity ([facility]) 05/04/2013  . Staphylococcus aureus septicemia, history of 05/04/2013  . Septic arthritis of multiple joints, history of 05/02/2013  . Hyperlipemia 12/28/2012  . Hypertension 12/28/2012  . Vitamin D deficiency 12/28/2012   Outpatient Encounter Prescriptions as of 05/06/2016  Medication Sig  . aspirin 325 MG tablet Take 325 mg by mouth daily.  Marland Kitchen. EPINEPHrine (EPI-PEN) 0.3 mg/0.3 mL SOAJ injection Inject 0.3 mLs (0.3 mg total) into the muscle once.  . mupirocin ointment (BACTROBAN) 2 % Apply 1 application topically 2 (two) times daily.  . ramipril (ALTACE) 2.5 MG capsule Take 1 capsule (2.5 mg  total) by mouth daily.  . Docosanol (ABREVA) 10 % CREA Apply 1 application topically 3 (three) times daily as needed (for fever blisters). Reported on 10/30/2015  . oxyCODONE-acetaminophen (ROXICET) 5-325 MG tablet Take 1 tablet by mouth every 8 (eight) hours as needed for severe pain. Do not fill until 05/14/15. (Patient not taking: Reported on 05/06/2016)   No facility-administered encounter medications on file as of 05/06/2016.       Review of Systems  Constitutional: Positive for fatigue (slight).  Eyes: Negative.   Respiratory: Negative.   Cardiovascular: Negative.   Gastrointestinal: Negative.   Genitourinary: Negative.   Musculoskeletal: Positive for arthralgias ("all over") and myalgias.  Neurological: Positive for headaches (occasional).       Objective:   Physical Exam  Constitutional: He is oriented to person, place, and time. He appears well-developed and well-nourished. No distress.  Patient is pleasant and alert  HENT:  Head: Normocephalic and atraumatic.  Right Ear: External ear normal.  Left Ear: External ear normal.  Mouth/Throat: Oropharynx is clear and moist. No oropharyngeal exudate.  Nasal congestion bilaterally  Eyes: Conjunctivae and EOM are normal. Pupils are equal, round, and reactive to light. Right eye exhibits no discharge. Left eye exhibits no discharge. No scleral icterus.  Neck: Normal range of motion. Neck supple. No thyromegaly present.  No bruits thyromegaly or anterior cervical adenopathy  Cardiovascular: Normal rate, regular rhythm, normal heart sounds and intact distal pulses.   No murmur heard. The  heart has a regular rate and rhythm at 72/m  Pulmonary/Chest: Effort normal and breath sounds normal. No respiratory distress. He has no wheezes. He has no rales.  No axillary adenopathy with a regular rate and rhythm of his heart at 72/m  Abdominal: Soft. Bowel sounds are normal. He exhibits no mass. There is no tenderness. There is no rebound and  no guarding.  Abdominal obesity without masses tenderness or organ enlargement  Genitourinary: Rectum normal and penis normal.  Genitourinary Comments: Prostate was enlarged and soft without lumps. There were no rectal masses. There are no inguinal hernias present bilaterally. External genitalia were within normal limits.  Musculoskeletal: Normal range of motion. He exhibits no edema.  Lymphadenopathy:    He has no cervical adenopathy.  Neurological: He is alert and oriented to person, place, and time. He has normal reflexes. No cranial nerve deficit.  Skin: Skin is warm and dry. No rash noted. No erythema.  These are not red or inflamed or draining. The patient does have some areas of folliculitis on his back and lower abdomen.  Psychiatric: He has a normal mood and affect. His behavior is normal. Judgment and thought content normal.  Nursing note and vitals reviewed.  BP 139/83 (BP Location: Left Arm, Patient Position: Sitting, Cuff Size: Large)   Pulse 64   Temp 98.2 F (36.8 C) (Oral)   Ht 5\' 11"  (1.803 m)   Wt (!) 322 lb 9.6 oz (146.3 kg)   BMI 44.99 kg/m         Assessment & Plan:  1. Vitamin D deficiency -Continue current treatment pending results of lab work  2. Essential hypertension -Blood pressure is good and he will continue with current treatment  3. Statin intolerance -The patient will have to continue with aggressive therapeutic lifestyle changes since he is statin intolerant  4. Pure hypercholesterolemia -Continue with aggressive therapeutic lifestyle changes including diet and exercise  5. Morbid obesity due to excess calories ([facility]) -Make every effort to lose weight through diet and exercise  6. Type 2 diabetes mellitus without complication, without long-term current use of insulin ([facility]) -The patient is currently not checking his blood sugars at home and he is not taking any medicine other than trying to watch his diet more closely  7. Neuropathy  ([facility]) -The neuropathy is secondary to the osteoarthritis in his back  8. Benign prostatic hyperplasia with lower urinary tract symptoms, symptom details unspecified -The patient does have some nocturia and slow stream. The prostate did not have any lumps or masses it was enlarged.  No orders of the defined types were placed in this encounter.  Patient Instructions  Continue current medications. Continue good therapeutic lifestyle changes which include good diet and exercise. Fall precautions discussed with patient. If an FOBT was given today- please return it to our front desk. If you are over 61 years old - you may need Prevnar 13 or the adult Pneumonia vaccine.  **Flu shots are available--- please call and schedule a FLU-CLINIC appointment**  After your visit with [place] today you will receive a survey in the mail or online from [employer] regarding your care with [place]. Please take a moment to fill this out. Your feedback is very important to [place] as you can help [place] better understand your patient needs as well as improve your experience and satisfaction. WE CARE ABOUT YOU!!!   Continue to drink plenty of fluids and make every effort to lose as much weight as possible Walk and  exercise regularly  Nyra Capes MD   .

## 2016-05-07 ENCOUNTER — Telehealth: Payer: Self-pay | Admitting: Family Medicine

## 2016-05-07 LAB — CBC WITH DIFFERENTIAL/PLATELET
Basophils Absolute: 0.1 10*3/uL (ref 0.0–0.2)
Basos: 1 %
EOS (ABSOLUTE): 0.3 10*3/uL (ref 0.0–0.4)
EOS: 4 %
HEMATOCRIT: 48.4 % (ref 37.5–51.0)
Hemoglobin: 16.5 g/dL (ref 12.6–17.7)
Immature Grans (Abs): 0 10*3/uL (ref 0.0–0.1)
Immature Granulocytes: 0 %
LYMPHS ABS: 2.2 10*3/uL (ref 0.7–3.1)
Lymphs: 30 %
MCH: 29.9 pg (ref 26.6–33.0)
MCHC: 34.1 g/dL (ref 31.5–35.7)
MCV: 88 fL (ref 79–97)
MONOS ABS: 0.5 10*3/uL (ref 0.1–0.9)
Monocytes: 7 %
NEUTROS ABS: 4.3 10*3/uL (ref 1.4–7.0)
Neutrophils: 58 %
Platelets: 239 10*3/uL (ref 150–379)
RBC: 5.52 x10E6/uL (ref 4.14–5.80)
RDW: 14.1 % (ref 12.3–15.4)
WBC: 7.5 10*3/uL (ref 3.4–10.8)

## 2016-05-07 LAB — BMP8+EGFR
BUN / CREAT RATIO: 13 (ref 10–24)
BUN: 14 mg/dL (ref 8–27)
CO2: 20 mmol/L (ref 18–29)
CREATININE: 1.1 mg/dL (ref 0.76–1.27)
Calcium: 9.8 mg/dL (ref 8.6–10.2)
Chloride: 101 mmol/L (ref 96–106)
GFR calc non Af Amer: 70 mL/min/{1.73_m2} (ref >59)
GFR, EST AFRICAN AMERICAN: 80 mL/min/{1.73_m2} (ref >59)
GLUCOSE: 110 mg/dL — AB (ref 65–99)
Potassium: 4.4 mmol/L (ref 3.5–5.2)
SODIUM: 140 mmol/L (ref 134–144)

## 2016-05-07 LAB — PSA, TOTAL AND FREE
PROSTATE SPECIFIC AG, SERUM: 0.7 ng/mL (ref 0.0–4.0)
PSA FREE PCT: 38.6 %
PSA FREE: 0.27 ng/mL

## 2016-05-07 LAB — HEPATIC FUNCTION PANEL
ALT: 35 IU/L (ref 0–44)
AST: 37 IU/L (ref 0–40)
Albumin: 4.9 g/dL — ABNORMAL HIGH (ref 3.6–4.8)
Alkaline Phosphatase: 62 IU/L (ref 39–117)
Bilirubin Total: 0.8 mg/dL (ref 0.0–1.2)
Bilirubin, Direct: 0.19 mg/dL (ref 0.00–0.40)
Total Protein: 7.3 g/dL (ref 6.0–8.5)

## 2016-05-07 LAB — LIPID PANEL
CHOL/HDL RATIO: 4.1 ratio (ref 0.0–5.0)
Cholesterol, Total: 173 mg/dL (ref 100–199)
HDL: 42 mg/dL (ref >39)
LDL CALC: 85 mg/dL (ref 0–99)
Triglycerides: 232 mg/dL — ABNORMAL HIGH (ref 0–149)
VLDL Cholesterol Cal: 46 mg/dL — ABNORMAL HIGH (ref 5–40)

## 2016-05-07 NOTE — Telephone Encounter (Signed)
Patient aware of results and verbalizes understanding.  

## 2016-09-03 ENCOUNTER — Telehealth: Payer: Self-pay | Admitting: Family Medicine

## 2016-09-03 NOTE — Telephone Encounter (Signed)
Pt has nasal congestion  Denies cough, body aches or fever Will try OTC Will call for appt if sxs persist or worsen

## 2016-09-04 ENCOUNTER — Encounter: Payer: Self-pay | Admitting: Physician Assistant

## 2016-09-04 ENCOUNTER — Ambulatory Visit (INDEPENDENT_AMBULATORY_CARE_PROVIDER_SITE_OTHER): Payer: Medicare Other | Admitting: Physician Assistant

## 2016-09-04 VITALS — BP 147/79 | HR 71 | Temp 98.5°F | Ht 71.0 in | Wt 328.0 lb

## 2016-09-04 DIAGNOSIS — J0100 Acute maxillary sinusitis, unspecified: Secondary | ICD-10-CM

## 2016-09-04 MED ORDER — LEVOFLOXACIN 500 MG PO TABS: 500.0000 mg | ORAL_TABLET | Freq: Every day | ORAL | 0 refills | Status: DC

## 2016-09-04 MED ORDER — AZELASTINE HCL 0.1 % NA SOLN: 1.0000 | Freq: Two times a day (BID) | NASAL | 11 refills | Status: DC

## 2016-09-04 NOTE — Patient Instructions (Signed)

## 2016-09-06 NOTE — Progress Notes (Signed)
BP (!) 147/79   Pulse 71   Temp 98.5 F (36.9 C) (Oral)   Ht 5\' 11"  (1.803 m)   Wt (!) 328 lb (148.8 kg)   BMI 45.75 kg/m    Subjective:    Patient ID: Seth Martinez, male    DOB: 07-24-1950, 66 y.o.   MRN: [id number]  HPI: Seth Martinez is a 67 y.o. male presenting on 09/04/2016 for head congestion (dizzy at times,clear nasal drip, allergies?)  This patient has had many days of sinus headache and postnasal drainage. There is copious drainage at times. Denies any fever at this time. There has been a history of sinus infections in the past.  No history of sinus surgery. There is cough at night. It has become more prevalent in recent days.   Relevant past medical, surgical, family and social history reviewed and updated as indicated. Allergies and medications reviewed and updated.  Past Medical History:  Diagnosis Date  . Chronic back pain   . Depression   . Fatigue   . Hyperlipidemia   . Hypertension   . Insomnia   . Joint pain   . Meniscus tear    bilateral knees  . Metabolic syndrome   . Obesity   . Septic arthritis (HCC) October/2014  . Vertigo     Past Surgical History:  Procedure Laterality Date  . BACK SURGERY  1992   lumb fusion  . ELBOW SURGERY  1965   right-fx  . fix screws in sacrum  in office  1993  . fusion lt sacrum with screws  1993  . HERNIA REPAIR     bilat ing 89 weeks old  . I&D EXTREMITY Right 06/23/2013   Procedure: IRRIGATION AND DEBRIDEMENT EXTREMITY;  Surgeon: Dominica Severin, MD;  Location: [facility];  Service: Orthopedics;  Laterality: Right;  . INGUINAL HERNIA REPAIR     bilat age 56  . KNEE ARTHROSCOPY Left 05/04/2013   Procedure: ARTHROSCOPY KNEE WITH WASHOUT;  Surgeon: Sheral Apley, MD;  Location: [facility] OR;  Service: Orthopedics;  Laterality: Left;  . ORIF CLAVICLE FRACTURE  1956   left-age 33  . ORIF FIBULA FRACTURE  1956   lt-accident age 23yr-  . ORIF PELVIC FRACTURE  1956   age 36  . ORIF RADIUS & ULNA  FRACTURES  1956   left-age 33  . rt knee arthroscopic  07/2006  . TEE WITHOUT CARDIOVERSION N/A 05/04/2013   Procedure: TRANSESOPHAGEAL ECHOCARDIOGRAM (TEE);  Surgeon: Donato Schultz, MD;  Location: [facility] END[facility]COPY;  Service: Cardiovascular;  Laterality: N/A;  . TENDON REPAIR Right 06/23/2013   Procedure: RIGHT INDEX FINGER AND PALM IRRIGATION AND DEBRIDEMENT FLEXOR  TENOSYNOVECTOMY BX AND REPAIR AS NECESSARY ;  Surgeon: Dominica Severin, MD;  Location: [facility];  Service: Orthopedics;  Laterality: Right;    Review of Systems  Constitutional: Positive for fatigue. Negative for appetite change.  HENT: Positive for sinus pressure and sore throat.   Eyes: Negative.  Negative for pain and visual disturbance.  Respiratory: Positive for shortness of breath and wheezing. Negative for cough and chest tightness.   Cardiovascular: Negative.  Negative for chest pain, palpitations and leg swelling.  Gastrointestinal: Negative.  Negative for abdominal pain, diarrhea, nausea and vomiting.  Endocrine: Negative.   Genitourinary: Negative.   Musculoskeletal: Positive for back pain and myalgias.  Skin: Negative.  Negative for color change and rash.  Neurological: Positive for headaches. Negative for weakness and numbness.  Psychiatric/Behavioral: Negative.  Allergies as of 09/04/2016      Reactions   Morphine And Related Anaphylaxis   Penicillins Anaphylaxis   Has patient had a PCN reaction causing immediate rash, facial/tongue/throat swelling, SOB or lightheadedness with hypotension: Yes Has patient had a PCN reaction causing severe rash involving mucus membranes or skin necrosis: No Has patient had a PCN reaction that required hospitalization No Has patient had a PCN reaction occurring within the last 10 years: No If all of the above answers are "NO", then may proceed with Cephalosporin use.   Vancomycin Rash, Other (See Comments)   Vasculitis, Red Man's Syndrome   Amitriptyline Other (See  Comments)   Unknown   Antara [fenofibrate] Other (See Comments)   Unknown   Bextra [valdecoxib] Other (See Comments)   Unknown   Cefdinir Other (See Comments)   Unknown   Celebrex [celecoxib] Other (See Comments)   Unknown   Crestor [rosuvastatin Calcium] Other (See Comments)   Unknown   Escitalopram Oxalate Other (See Comments)   Unknown   Fish Oil Other (See Comments)   Unknown   Ibuprofen Swelling   Naproxen Sodium Other (See Comments)   Unknown   Neurontin [gabapentin] Other (See Comments)   Unknown   Skelaxin Other (See Comments)   Unknown   Ultram [tramadol] Other (See Comments)   Unknown   Vioxx [rofecoxib] Other (See Comments)   Unknown   Zocor [simvastatin] Other (See Comments)   Unknown      Medication List       Accurate as of 09/04/16 11:59 PM. Always use your most recent med list.          ABREVA 10 % Crea Generic drug:  Docosanol Apply 1 application topically 3 (three) times daily as needed (for fever blisters). Reported on 10/30/2015   aspirin 325 MG tablet Take 325 mg by mouth daily.   azelastine 0.1 % nasal spray Commonly known as:  ASTELIN Place 1 spray into both nostrils 2 (two) times daily. Use in each nostril as directed   EPINEPHrine 0.3 mg/0.3 mL Soaj injection Commonly known as:  EPI-PEN Inject 0.3 mLs (0.3 mg total) into the muscle once.   levofloxacin 500 MG tablet Commonly known as:  LEVAQUIN Take 1 tablet (500 mg total) by mouth daily.   mupirocin ointment 2 % Commonly known as:  BACTROBAN Apply 1 application topically 2 (two) times daily.   oxyCODONE-acetaminophen 5-325 MG tablet Commonly known as:  ROXICET Take 1 tablet by mouth every 8 (eight) hours as needed for severe pain. Do not fill until 05/14/15.   ramipril 2.5 MG capsule Commonly known as:  ALTACE Take 1 capsule (2.5 mg total) by mouth daily.          Objective:    BP (!) 147/79   Pulse 71   Temp 98.5 F (36.9 C) (Oral)   Ht 5\' 11"  (1.803 m)   Wt (!)  328 lb (148.8 kg)   BMI 45.75 kg/m   Allergies  Allergen Reactions  . Morphine And Related Anaphylaxis  . Penicillins Anaphylaxis    Has patient had a PCN reaction causing immediate rash, facial/tongue/throat swelling, SOB or lightheadedness with hypotension: Yes Has patient had a PCN reaction causing severe rash involving mucus membranes or skin necrosis: No Has patient had a PCN reaction that required hospitalization No Has patient had a PCN reaction occurring within the last 10 years: No If all of the above answers are "NO", then may proceed with Cephalosporin use.  . Vancomycin  Rash and Other (See Comments)    Vasculitis, Red Man's Syndrome  . Amitriptyline Other (See Comments)    Unknown  . Antara [Fenofibrate] Other (See Comments)    Unknown  . Bextra [Valdecoxib] Other (See Comments)    Unknown  . Cefdinir Other (See Comments)    Unknown  . Celebrex [Celecoxib] Other (See Comments)    Unknown  . Crestor [Rosuvastatin Calcium] Other (See Comments)    Unknown  . Escitalopram Oxalate Other (See Comments)    Unknown  . Fish Oil Other (See Comments)    Unknown  . Ibuprofen Swelling  . Naproxen Sodium Other (See Comments)    Unknown  . Neurontin [Gabapentin] Other (See Comments)    Unknown  . Skelaxin Other (See Comments)    Unknown  . Ultram [Tramadol] Other (See Comments)    Unknown  . Vioxx [Rofecoxib] Other (See Comments)    Unknown  . Zocor [Simvastatin] Other (See Comments)    Unknown    Physical Exam  Constitutional: He appears well-developed and well-nourished.  HENT:  Head: Normocephalic and atraumatic.  Right Ear: Hearing and tympanic membrane normal.  Left Ear: Hearing and tympanic membrane normal.  Nose: Mucosal edema and sinus tenderness present. No nasal deformity. Right sinus exhibits frontal sinus tenderness. Left sinus exhibits frontal sinus tenderness.  Mouth/Throat: Posterior oropharyngeal erythema present.  Eyes: Conjunctivae and EOM are  normal. Pupils are equal, round, and reactive to light. Right eye exhibits no discharge. Left eye exhibits no discharge.  Neck: Normal range of motion. Neck supple.  Cardiovascular: Normal rate, regular rhythm and normal heart sounds.   Pulmonary/Chest: Effort normal. No respiratory distress. He has no decreased breath sounds. He has wheezes. He has no rhonchi. He has no rales.  Abdominal: Soft. Bowel sounds are normal.  Musculoskeletal: Normal range of motion.  Skin: Skin is warm and dry.        Assessment & Plan:   1. Acute non-recurrent maxillary sinusitis - levofloxacin (LEVAQUIN) 500 MG tablet; Take 1 tablet (500 mg total) by mouth daily.  Dispense: 10 tablet; Refill: 0   Continue all other maintenance medications as listed above.  Follow up plan: Return if symptoms worsen or fail to improve.  Educational handout given for bronchitis  Remus LofflerAngel S. Aizik Reh PA-C [facility] [address]  [place], [place] [address] [phone]   09/06/2016, 6:29 PM

## 2016-11-17 NOTE — Progress Notes (Signed)
Spoke with pt- he states that the pain he was having is gone and he no longer needs this test.

## 2016-12-23 ENCOUNTER — Ambulatory Visit: Payer: Medicare Other | Admitting: Family Medicine

## 2016-12-28 ENCOUNTER — Encounter: Payer: Self-pay | Admitting: Family Medicine

## 2016-12-28 ENCOUNTER — Ambulatory Visit (INDEPENDENT_AMBULATORY_CARE_PROVIDER_SITE_OTHER): Payer: Medicare Other | Admitting: Family Medicine

## 2016-12-28 VITALS — BP 150/98 | HR 64 | Temp 97.2°F | Ht 71.0 in | Wt 326.0 lb

## 2016-12-28 DIAGNOSIS — E119 Type 2 diabetes mellitus without complications: Secondary | ICD-10-CM | POA: Diagnosis not present

## 2016-12-28 DIAGNOSIS — E559 Vitamin D deficiency, unspecified: Secondary | ICD-10-CM

## 2016-12-28 DIAGNOSIS — M5441 Lumbago with sciatica, right side: Secondary | ICD-10-CM

## 2016-12-28 DIAGNOSIS — W57XXXA Bitten or stung by nonvenomous insect and other nonvenomous arthropods, initial encounter: Secondary | ICD-10-CM

## 2016-12-28 DIAGNOSIS — I10 Essential (primary) hypertension: Secondary | ICD-10-CM | POA: Diagnosis not present

## 2016-12-28 DIAGNOSIS — M5442 Lumbago with sciatica, left side: Secondary | ICD-10-CM | POA: Diagnosis not present

## 2016-12-28 DIAGNOSIS — G8929 Other chronic pain: Secondary | ICD-10-CM

## 2016-12-28 DIAGNOSIS — N401 Enlarged prostate with lower urinary tract symptoms: Secondary | ICD-10-CM

## 2016-12-28 DIAGNOSIS — Z789 Other specified health status: Secondary | ICD-10-CM

## 2016-12-28 DIAGNOSIS — E7800 Pure hypercholesterolemia, unspecified: Secondary | ICD-10-CM | POA: Diagnosis not present

## 2016-12-28 LAB — BAYER DCA HB A1C WAIVED: HB A1C (BAYER DCA - WAIVED): 5.8 % (ref <7.0)

## 2016-12-28 MED ORDER — RAMIPRIL 2.5 MG PO CAPS: 2.5000 mg | ORAL_CAPSULE | Freq: Every day | ORAL | 3 refills | Status: DC

## 2016-12-28 MED ORDER — DOXYCYCLINE HYCLATE 100 MG PO TABS: 100.0000 mg | ORAL_TABLET | Freq: Two times a day (BID) | ORAL | 0 refills | Status: DC

## 2016-12-28 MED ORDER — CYCLOBENZAPRINE HCL 10 MG PO TABS: 10.0000 mg | ORAL_TABLET | Freq: Three times a day (TID) | ORAL | 1 refills | Status: DC | PRN

## 2016-12-28 NOTE — Patient Instructions (Addendum)
Medicare Annual Wellness Visit  [place] and the medical providers at [facility] strive to bring you the best medical care.  In doing so we not only want to address your current medical conditions and concerns but also to detect new conditions early and prevent illness, disease and health-related problems.    Medicare offers a yearly Wellness Visit which allows our clinical staff to assess your need for preventative services including immunizations, lifestyle education, counseling to decrease risk of preventable diseases and screening for fall risk and other medical concerns.    This visit is provided free of charge (no copay) for all Medicare recipients. The clinical pharmacists at [facility] have begun to conduct these Wellness Visits which will also include a thorough review of all your medications.    As you primary medical provider recommend that you make an appointment for your Annual Wellness Visit if you have not done so already this year.  You may set up this appointment before you leave today or you may call back [phone]) and schedule an appointment.  Please make sure when you call that you mention that you are scheduling your Annual Wellness Visit with the clinical pharmacist so that the appointment may be made for the proper length of time.     Continue current medications. Continue good therapeutic lifestyle changes which include good diet and exercise. Fall precautions discussed with patient. If an FOBT was given today- please return it to our front desk. If you are over 68 years old - you may need Prevnar 13 or the adult Pneumonia vaccine.  **Flu shots are available--- please call and schedule a FLU-CLINIC appointment**  After your visit with us today you will receive a survey in the mail or online from [employer] regarding your care with us. Please take a moment to fill this out. Your feedback is very  important to us as you can help us better understand your patient needs as well as improve your experience and satisfaction. WE CARE ABOUT YOU!!!   Start back on the blood pressure medicine and did not stop it Take vitamin D regularly Listen to your daughter! Care about your family and take your medicine regularly and lose weight Drink more water

## 2016-12-28 NOTE — Progress Notes (Signed)
Subjective:    Patient ID: Seth Martinez, male    DOB: 01-30-1951, 66 y.o.   MRN: [id number]  HPI Pt here for follow up and management of chronic medical problems which includes diabetes, hyperlipidemia and hypertension. He is taking medication regularly.The patient today complains of fatigue and spasms in his back. He is requesting a refill on ramipril which by the way he has not been taking. This could explain the reason why his blood pressure so high today. He is requesting something for muscle spasms in his back. He will get lab work today and will be given an FOBT to return. The patient refuses flu shots and Prevnar vaccine. The patient's daughter is a Marine scientist and works in the emergency department. She is managing stroke education for patients. She is encouraged her father take his blood pressure medicine regularly which he is not been doing. I am also upset with him for not taking his medicines regularly. He also does not take his vitamin D. The patient denies any chest pain or shortness of breath anymore than usual. He did have an episode of upper abdominal pain 1 day not related to activity that went away and has not recurred. He denies any nausea vomiting diarrhea blood in the stool or black tarry bowel movements. He is passing his water without problems. He is alert and certainly capable of taking his medicines.  Patient Active Problem List   Diagnosis Date Noted  . BPH (benign prostatic hypertrophy) 12/26/2014  . Metabolic syndrome 08/65/7846  . Multiple drug allergies 09/28/2013  . Statin intolerance 09/28/2013  . Nocturia 07/25/2013  . Rash and nonspecific skin eruption 05/15/2013  . Morbid obesity ([place]) 05/04/2013  . Staphylococcus aureus septicemia, history of 05/04/2013  . Septic arthritis of multiple joints, history of 05/02/2013  . Hyperlipemia 12/28/2012  . Hypertension 12/28/2012  . Vitamin D deficiency 12/28/2012   Outpatient Encounter Prescriptions as of 12/28/2016    Medication Sig  . aspirin 325 MG tablet Take 325 mg by mouth daily.  Marland Kitchen azelastine (ASTELIN) 0.1 % nasal spray Place 1 spray into both nostrils 2 (two) times daily. Use in each nostril as directed (Patient not taking: Reported on 12/28/2016)  . EPINEPHrine (EPI-PEN) 0.3 mg/0.3 mL SOAJ injection Inject 0.3 mLs (0.3 mg total) into the muscle once. (Patient not taking: Reported on 12/28/2016)  . ramipril (ALTACE) 2.5 MG capsule Take 1 capsule (2.5 mg total) by mouth daily. (Patient not taking: Reported on 12/28/2016)  . [DISCONTINUED] Docosanol (ABREVA) 10 % CREA Apply 1 application topically 3 (three) times daily as needed (for fever blisters). Reported on 10/30/2015  . [DISCONTINUED] levofloxacin (LEVAQUIN) 500 MG tablet Take 1 tablet (500 mg total) by mouth daily.  . [DISCONTINUED] mupirocin ointment (BACTROBAN) 2 % Apply 1 application topically 2 (two) times daily.  . [DISCONTINUED] oxyCODONE-acetaminophen (ROXICET) 5-325 MG tablet Take 1 tablet by mouth every 8 (eight) hours as needed for severe pain. Do not fill until 05/14/15.   No facility-administered encounter medications on file as of 12/28/2016.      Review of Systems  Constitutional: Positive for fatigue.  HENT: Negative.   Eyes: Negative.   Respiratory: Negative.   Cardiovascular: Negative.   Gastrointestinal: Negative.   Endocrine: Negative.   Genitourinary: Negative.   Musculoskeletal: Negative.        Back spasms at times   Skin: Negative.   Allergic/Immunologic: Negative.   Neurological: Negative.   Hematological: Negative.   Psychiatric/Behavioral: Negative.  Objective:   Physical Exam  Constitutional: He is oriented to person, place, and time. He appears well-developed and well-nourished. No distress.  HENT:  Head: Normocephalic and atraumatic.  Right Ear: External ear normal.  Left Ear: External ear normal.  Mouth/Throat: Oropharynx is clear and moist. No oropharyngeal exudate.  Nasal congestion bilaterally   Eyes: Conjunctivae and EOM are normal. Pupils are equal, round, and reactive to light. Right eye exhibits no discharge. Left eye exhibits no discharge. No scleral icterus.  Neck: Normal range of motion. Neck supple. No thyromegaly present.  No bruits thyromegaly or anterior cervical adenopathy  Cardiovascular: Normal rate, regular rhythm, normal heart sounds and intact distal pulses.   No murmur heard. The heart had a regular rate and rhythm at 64/m  Pulmonary/Chest: Effort normal and breath sounds normal. No respiratory distress. He has no wheezes. He has no rales. He exhibits no tenderness.  No axillary adenopathy Clear anteriorly and posteriorly  Abdominal: Soft. Bowel sounds are normal. He exhibits no mass. There is tenderness. There is no rebound and no guarding.  The abdomen is morbidly obese without masses  or organ enlargement or bruits. There was some slight tenderness in the right inguinal area but no masses present. No lymph nodes present.  Musculoskeletal: Normal range of motion. He exhibits no edema or tenderness.  The patient's range of motion was somewhat limited with laying down and sitting up.  Lymphadenopathy:    He has no cervical adenopathy.  Neurological: He is alert and oriented to person, place, and time. He has normal reflexes. No cranial nerve deficit.  Skin: Skin is warm and dry. No rash noted.  Psychiatric: He has a normal mood and affect. His behavior is normal. Judgment and thought content normal.  Nursing note and vitals reviewed.  BP (!) 172/88 (BP Location: Left Arm)   Pulse 64   Temp 97.2 F (36.2 C) (Oral)   Ht '5\' 11"'  (1.803 m)   Wt (!) 326 lb (147.9 kg)   BMI 45.47 kg/m         Assessment & Plan:  1. Essential hypertension -The blood pressure is elevated today and on recheck was 150/98 using the large cuff on the right arm with the patient sitting. There is really no excuse for this patient not to take his blood pressure medicines. I reminded him  of what can happen if he does not. He also needs to continue to watch his sodium intake. - BMP8+EGFR - CBC with Differential/Platelet - Hepatic function panel  2. Vitamin D deficiency -He has not been taking his vitamin D replacement. He says he will start back on this. - CBC with Differential/Platelet - VITAMIN D 25 Hydroxy (Vit-D Deficiency, Fractures)  3. Pure hypercholesterolemia -The patient is statin intolerant and he should continue with as aggressive therapeutic lifestyle changes as possible - CBC with Differential/Platelet - Lipid panel  4. Type 2 diabetes mellitus without complication, without long-term current use of insulin ([place]) -He is currently not taking anything for his diabetes but only trying to watch his diet. - CBC with Differential/Platelet - Bayer DCA Hb A1c Waived  5. Morbid obesity due to excess calories ([place]) -The patient should focus on eating less eating more fresh fruits and vegetables and cutting out sugar. He understands the importance of weight loss. He has to make a commitment to do this. - CBC with Differential/Platelet  6. Benign prostatic hyperplasia with lower urinary tract symptoms, symptom details unspecified -No complaints today with voiding other than  frequency. - CBC with Differential/Platelet  7. Statin intolerance -The patient will need to continue with aggressive therapeutic lifestyle changes  8. Chronic low back pain with bilateral sciatica, unspecified back pain laterality -Continue to work on weight loss through diet and exercise  9. Tick bite, initial encounter-the patient has had multiple tick bites some on the right thigh and on the abdomen. - [place] mtn spotted fvr abs pnl(IgG+IgM) - Lyme Ab/Western Blot Reflex   Meds ordered this encounter  Medications  . ramipril (ALTACE) 2.5 MG capsule    Sig: Take 1 capsule (2.5 mg total) by mouth daily.    Dispense:  90 capsule    Refill:  3  . doxycycline (VIBRA-TABS) 100 MG tablet      Sig: Take 1 tablet (100 mg total) by mouth 2 (two) times daily. 1 po bid    Dispense:  42 tablet    Refill:  0  . cyclobenzaprine (FLEXERIL) 10 MG tablet    Sig: Take 1 tablet (10 mg total) by mouth 3 (three) times daily as needed for muscle spasms.    Dispense:  30 tablet    Refill:  1   Patient Instructions                       Medicare Annual Wellness Visit  [facility] and the medical providers at [facility] strive to bring you the best medical care.  In doing so we not only want to address your current medical conditions and concerns but also to detect new conditions early and prevent illness, disease and health-related problems.    Medicare offers a yearly Wellness Visit which allows our clinical staff to assess your need for preventative services including immunizations, lifestyle education, counseling to decrease risk of preventable diseases and screening for fall risk and other medical concerns.    This visit is provided free of charge (no copay) for all Medicare recipients. The clinical pharmacists at [facility] have begun to conduct these Wellness Visits which will also include a thorough review of all your medications.    As you primary medical provider recommend that you make an appointment for your Annual Wellness Visit if you have not done so already this year.  You may set up this appointment before you leave today or you may call back [phone]) and schedule an appointment.  Please make sure when you call that you mention that you are scheduling your Annual Wellness Visit with the clinical pharmacist so that the appointment may be made for the proper length of time.     Continue current medications. Continue good therapeutic lifestyle changes which include good diet and exercise. Fall precautions discussed with patient. If an FOBT was given today- please return it to our front desk. If you are over 18 years old - you may  need Prevnar 68 or the adult Pneumonia vaccine.  **Flu shots are available--- please call and schedule a FLU-CLINIC appointment**  After your visit with [place] today you will receive a survey in the mail or online from [employer] regarding your care with [place]. Please take a moment to fill this out. Your feedback is very important to [place] as you can help [place] better understand your patient needs as well as improve your experience and satisfaction. WE CARE ABOUT YOU!!!   Start back on the blood pressure medicine and did not stop it Take vitamin D regularly Listen to your daughter! Care about your  family and take your medicine regularly and lose weight Drink more water   Arrie Senate MD

## 2016-12-29 LAB — HEPATIC FUNCTION PANEL
ALBUMIN: 4.3 g/dL (ref 3.6–4.8)
ALK PHOS: 61 IU/L (ref 39–117)
ALT: 38 IU/L (ref 0–44)
AST: 45 IU/L — ABNORMAL HIGH (ref 0–40)
BILIRUBIN TOTAL: 0.3 mg/dL (ref 0.0–1.2)
BILIRUBIN, DIRECT: 0.12 mg/dL (ref 0.00–0.40)
TOTAL PROTEIN: 6.7 g/dL (ref 6.0–8.5)

## 2016-12-29 LAB — BMP8+EGFR
BUN/Creatinine Ratio: 9 — ABNORMAL LOW (ref 10–24)
BUN: 9 mg/dL (ref 8–27)
CALCIUM: 9 mg/dL (ref 8.6–10.2)
CHLORIDE: 104 mmol/L (ref 96–106)
CO2: 20 mmol/L (ref 20–29)
CREATININE: 1.01 mg/dL (ref 0.76–1.27)
GFR calc Af Amer: 89 mL/min/{1.73_m2} (ref >59)
GFR calc non Af Amer: 77 mL/min/{1.73_m2} (ref >59)
GLUCOSE: 114 mg/dL — AB (ref 65–99)
Potassium: 4.2 mmol/L (ref 3.5–5.2)
Sodium: 142 mmol/L (ref 134–144)

## 2016-12-29 LAB — VITAMIN D 25 HYDROXY (VIT D DEFICIENCY, FRACTURES): Vit D, 25-Hydroxy: 28.9 ng/mL — ABNORMAL LOW (ref 30.0–100.0)

## 2016-12-29 LAB — CBC WITH DIFFERENTIAL/PLATELET
BASOS ABS: 0.1 10*3/uL (ref 0.0–0.2)
Basos: 2 %
EOS (ABSOLUTE): 0.6 10*3/uL — ABNORMAL HIGH (ref 0.0–0.4)
Eos: 12 %
HEMOGLOBIN: 15.3 g/dL (ref 13.0–17.7)
Hematocrit: 45.4 % (ref 37.5–51.0)
IMMATURE GRANS (ABS): 0 10*3/uL (ref 0.0–0.1)
IMMATURE GRANULOCYTES: 0 %
Lymphocytes Absolute: 1.4 10*3/uL (ref 0.7–3.1)
Lymphs: 26 %
MCH: 29.6 pg (ref 26.6–33.0)
MCHC: 33.7 g/dL (ref 31.5–35.7)
MCV: 88 fL (ref 79–97)
MONOCYTES: 9 %
Monocytes Absolute: 0.5 10*3/uL (ref 0.1–0.9)
Neutrophils Absolute: 2.7 10*3/uL (ref 1.4–7.0)
Neutrophils: 51 %
Platelets: 225 10*3/uL (ref 150–379)
RBC: 5.17 x10E6/uL (ref 4.14–5.80)
RDW: 14.9 % (ref 12.3–15.4)
WBC: 5.3 10*3/uL (ref 3.4–10.8)

## 2016-12-29 LAB — LIPID PANEL
CHOL/HDL RATIO: 4.3 ratio (ref 0.0–5.0)
CHOLESTEROL TOTAL: 167 mg/dL (ref 100–199)
HDL: 39 mg/dL — ABNORMAL LOW (ref >39)
LDL CALC: 85 mg/dL (ref 0–99)
Triglycerides: 215 mg/dL — ABNORMAL HIGH (ref 0–149)
VLDL CHOLESTEROL CAL: 43 mg/dL — AB (ref 5–40)

## 2017-02-15 ENCOUNTER — Telehealth: Payer: Self-pay | Admitting: Family Medicine

## 2017-02-15 NOTE — Telephone Encounter (Signed)
Pt  Aware by detailed VM that he would need an appt for this to be refilled - as this is office policy.

## 2017-02-15 NOTE — Telephone Encounter (Signed)
Please advise 

## 2017-02-17 ENCOUNTER — Ambulatory Visit (INDEPENDENT_AMBULATORY_CARE_PROVIDER_SITE_OTHER): Payer: Medicare Other | Admitting: Family Medicine

## 2017-02-17 ENCOUNTER — Encounter: Payer: Self-pay | Admitting: Family Medicine

## 2017-02-17 VITALS — BP 150/92 | HR 62 | Temp 97.8°F | Ht 71.0 in | Wt 321.0 lb

## 2017-02-17 DIAGNOSIS — M545 Low back pain: Secondary | ICD-10-CM | POA: Diagnosis not present

## 2017-02-17 DIAGNOSIS — I10 Essential (primary) hypertension: Secondary | ICD-10-CM

## 2017-02-17 DIAGNOSIS — Z79891 Long term (current) use of opiate analgesic: Secondary | ICD-10-CM | POA: Diagnosis not present

## 2017-02-17 DIAGNOSIS — R52 Pain, unspecified: Secondary | ICD-10-CM

## 2017-02-17 MED ORDER — HYDROCODONE-ACETAMINOPHEN 5-325 MG PO TABS: 1.0000 | ORAL_TABLET | Freq: Every day | ORAL | 0 refills | Status: DC | PRN

## 2017-02-17 NOTE — Patient Instructions (Signed)
Take blood pressure medicine daily and not every other day Watch sodium intake Take pain medicine only if needed Avoid situations where increased accidents have a tendency to happen Return to clinic in a couple weeks for the nurse to check the blood pressure and he did not have to be seen by the physician. Bring outside blood pressure readings to that visit.

## 2017-02-17 NOTE — Progress Notes (Signed)
Subjective:    Patient ID: Lurlean NannyWilliam J Slagel, male    DOB: [birth year], 66 y.o.   MRN: [id number]  HPI Patient here today for pain mgmt and back pain.The patient only has a muscle relaxer that he takes it he does have ongoing chronic back pain that he is had for years that he has more trouble with at some times than he does at other times. He has a history of hypertension hyperlipidemia and morbid obesity and has multiple drug allergies. The last lumbar spine films that we have on record were done in July 2015. There were chronic changes present with degenerative changes of the right SI joint. There is a broken screw at S1 with osteophytes and degenerative disc changes at L2 and L3 and 4. This pain has been exasperated recently because he was cleaning his pool and fell against the back of the pool and sustained a bruise to above his left hip. The pain only started happening several days after the fall. It is getting better now. He is had pain medicine at home and go back to 2016 still has 3 pills left in that bottle. He just wants to have something available to use in case he needs something stronger for pain. We will give him hydrocodone 5/325 #15 tablets with 2 refills taking 1 daily if needed. The patient also described some bouts of right lower quadrant abdominal pain every 2-3 weeks lasting about 15 seconds and this still sounds like it is a radicular problem from his back. I told her if it becomes more persistent lasting for longer period of time that we can get an ultrasound of his abdomen and that he should get back in touch with us. The patient has not been taking his blood pressure medicine regularly and he promises to take it every day on a consistent basis and come back to the office in a couple weeks to have the nurse recheck his blood pressure.    Patient Active Problem List   Diagnosis Date Noted  . BPH (benign prostatic hypertrophy) 12/26/2014  . Metabolic syndrome 09/28/2013  .  Multiple drug allergies 09/28/2013  . Statin intolerance 09/28/2013  . Nocturia 07/25/2013  . Rash and nonspecific skin eruption 05/15/2013  . Morbid obesity (HCC) 05/04/2013  . Staphylococcus aureus septicemia, history of 05/04/2013  . Septic arthritis of multiple joints, history of 05/02/2013  . Hyperlipemia 12/28/2012  . Hypertension 12/28/2012  . Vitamin D deficiency 12/28/2012   Outpatient Encounter Prescriptions as of 02/17/2017  Medication Sig  . aspirin 325 MG tablet Take 325 mg by mouth daily.  Marland Kitchen. azelastine (ASTELIN) 0.1 % nasal spray Place 1 spray into both nostrils 2 (two) times daily. Use in each nostril as directed (Patient not taking: Reported on 12/28/2016)  . cyclobenzaprine (FLEXERIL) 10 MG tablet Take 1 tablet (10 mg total) by mouth 3 (three) times daily as needed for muscle spasms.  Marland Kitchen. doxycycline (VIBRA-TABS) 100 MG tablet Take 1 tablet (100 mg total) by mouth 2 (two) times daily. 1 po bid  . EPINEPHrine (EPI-PEN) 0.3 mg/0.3 mL SOAJ injection Inject 0.3 mLs (0.3 mg total) into the muscle once. (Patient not taking: Reported on 12/28/2016)  . ramipril (ALTACE) 2.5 MG capsule Take 1 capsule (2.5 mg total) by mouth daily.   No facility-administered encounter medications on file as of 02/17/2017.       Review of Systems  Constitutional: Negative.   HENT: Negative.   Eyes: Negative.   Respiratory: Negative.   Cardiovascular:  Negative.   Gastrointestinal: Negative.   Endocrine: Negative.   Genitourinary: Negative.   Musculoskeletal: Positive for arthralgias (left hip pain ), back pain and myalgias (back spams at times).  Skin: Negative.   Allergic/Immunologic: Negative.   Neurological: Negative.   Hematological: Negative.   Psychiatric/Behavioral: Negative.        Objective:   Physical Exam  Constitutional: He is oriented to person, place, and time. He appears well-developed and well-nourished. No distress.  The patient is pleasant and alert and in no acute  distress.  HENT:  Head: Normocephalic and atraumatic.  Eyes: Pupils are equal, round, and reactive to light. Conjunctivae and EOM are normal. Right eye exhibits no discharge. Left eye exhibits no discharge. No scleral icterus.  Neck: Normal range of motion.  Musculoskeletal: Normal range of motion. He exhibits tenderness. He exhibits no edema or deformity.  Fairly good mobility with sitting and standing and movement. The bruise that was above his left hip has resolved.  Neurological: He is alert and oriented to person, place, and time.  Skin: Skin is warm and dry. No rash noted.  Psychiatric: He has a normal mood and affect. His behavior is normal. Judgment and thought content normal.  Nursing note and vitals reviewed.  BP (!) 161/90 (BP Location: Left Arm)   Pulse 62   Temp 97.8 F (36.6 C) (Oral)   Ht 5\' 11"  (1.803 m)   Wt (!) 321 lb (145.6 kg)   BMI 44.77 kg/m    Blood pressure recheck was 150/92. This is in the white arm sitting with a large cuff.     Assessment & Plan:  1. Pain management -Take hydrocodone 5/325 one daily if needed #15 with 2 refills - ToxASSURE Select 13 (MW), Urine  2. Low back pain, unspecified back pain laterality, unspecified chronicity, with sciatica presence unspecified -The patient should be more careful about putting himself at risk for falling and take the pain medicine only if needed and as little as possible  3. Essential hypertension -Take blood pressure medicine daily and not every other day and return to the office in a couple weeks for the nurse to recheck blood pressure at that time.  Meds ordered this encounter  Medications  . HYDROcodone-acetaminophen (NORCO/VICODIN) 5-325 MG tablet    Sig: Take 1 tablet by mouth daily as needed for moderate pain.    Dispense:  15 tablet    Refill:  0  . HYDROcodone-acetaminophen (NORCO/VICODIN) 5-325 MG tablet    Sig: Take 1 tablet by mouth daily as needed for moderate pain.    Dispense:  15 tablet     Refill:  0    Do not fill until 04/17/17  . HYDROcodone-acetaminophen (NORCO/VICODIN) 5-325 MG tablet    Sig: Take 1 tablet by mouth daily as needed for moderate pain.    Dispense:  15 tablet    Refill:  0    Do not fill until 05/16/17   Patient Instructions  Take blood pressure medicine daily and not every other day Watch sodium intake Take pain medicine only if needed Avoid situations where increased accidents have a tendency to happen Return to clinic in a couple weeks for the nurse to check the blood pressure and he did not have to be seen by the physician. Bring outside blood pressure readings to that visit.  Nyra Capeson W. Moore MD

## 2017-02-18 ENCOUNTER — Telehealth: Payer: Self-pay | Admitting: Family Medicine

## 2017-02-18 DIAGNOSIS — M545 Low back pain: Secondary | ICD-10-CM

## 2017-02-18 DIAGNOSIS — M25552 Pain in left hip: Secondary | ICD-10-CM

## 2017-02-18 MED ORDER — OXYCODONE-ACETAMINOPHEN 5-325 MG PO TABS: 1.0000 | ORAL_TABLET | Freq: Three times a day (TID) | ORAL | 0 refills | Status: DC | PRN

## 2017-02-18 NOTE — Telephone Encounter (Signed)
These drugs are almost interchangeable for most people. If he really wants oxycodone rather than hydrocodone he will have to return the prescription or pills of hydrocodone. He cannot have both

## 2017-02-18 NOTE — Telephone Encounter (Signed)
Pt was given Hydrocodone instead of Oxycodone by [facility] at [facility] on 02/17/2017 Pt does not want to take Hydrocodone He wants RX for Oxycodoen because he know s it works for his pain Please advise

## 2017-02-18 NOTE — Telephone Encounter (Signed)
Per Dr Hyacinth MeekerMiller, RX for [facility] written x 1 Pt will have to get refills from Dr Christell ConstantMoore on Monday

## 2017-02-19 NOTE — Telephone Encounter (Signed)
Please make sure that the patient returns the prescription pills for hydrocodone since he got this prescription for oxycodone. We can give him more refills or additional prescriptions after he takes up the prescription given to him by Dr. Hyacinth MeekerMiller.

## 2017-02-19 NOTE — Telephone Encounter (Signed)
Lm 8/3-jhb

## 2017-02-22 LAB — TOXASSURE SELECT 13 (MW), URINE

## 2017-02-22 NOTE — Telephone Encounter (Signed)
Pt came by - can he get #30 on the next 2 refills that we will write on Wednesday  Needs to fill another #15 on a 3 rd - he is going to run out   Xray order - lumbar and right lower back pain

## 2017-02-24 ENCOUNTER — Ambulatory Visit (INDEPENDENT_AMBULATORY_CARE_PROVIDER_SITE_OTHER): Payer: Medicare Other

## 2017-02-24 ENCOUNTER — Other Ambulatory Visit (INDEPENDENT_AMBULATORY_CARE_PROVIDER_SITE_OTHER): Payer: Medicare Other

## 2017-02-24 DIAGNOSIS — M25552 Pain in left hip: Secondary | ICD-10-CM

## 2017-02-24 DIAGNOSIS — M545 Low back pain: Secondary | ICD-10-CM | POA: Diagnosis not present

## 2017-02-24 MED ORDER — OXYCODONE-ACETAMINOPHEN 5-325 MG PO TABS: 1.0000 | ORAL_TABLET | Freq: Three times a day (TID) | ORAL | 0 refills | Status: DC | PRN

## 2017-02-24 NOTE — Telephone Encounter (Signed)
Aware of xray and rx's

## 2017-02-24 NOTE — Telephone Encounter (Signed)
It is okay to get lumbar spine films and left hip films

## 2017-02-24 NOTE — Telephone Encounter (Signed)
He would like to come by today and get a xray for the pain he was /is having lumbar and left hip ?? Would that be ok.   Please see the meds I have pended for you to approve

## 2017-02-25 ENCOUNTER — Telehealth: Payer: Self-pay | Admitting: Family Medicine

## 2017-02-26 NOTE — Telephone Encounter (Signed)
Pt aware and doesn't want ortho appt.

## 2017-03-02 ENCOUNTER — Telehealth: Payer: Self-pay | Admitting: Family Medicine

## 2017-03-02 MED ORDER — ICOSAPENT ETHYL 1 G PO CAPS: 2.0000 g | ORAL_CAPSULE | Freq: Two times a day (BID) | ORAL | 1 refills | Status: DC

## 2017-03-02 MED ORDER — CYCLOBENZAPRINE HCL 10 MG PO TABS: 10.0000 mg | ORAL_TABLET | Freq: Three times a day (TID) | ORAL | 1 refills | Status: DC | PRN

## 2017-03-02 MED ORDER — PREDNISONE 10 MG PO TABS: ORAL_TABLET | ORAL | 0 refills | Status: DC

## 2017-03-02 NOTE — Telephone Encounter (Signed)
PER [facility]  - send in meds for pt and back pain

## 2017-04-11 IMAGING — CR DG CHEST 2V
2 series · 2 of 2 positions shown · non-contrast
Comparison: 06/10/2015

CLINICAL DATA: Shortness of breath

EXAM:
CHEST  2 VIEW

[view not recorded (1 of 2)]
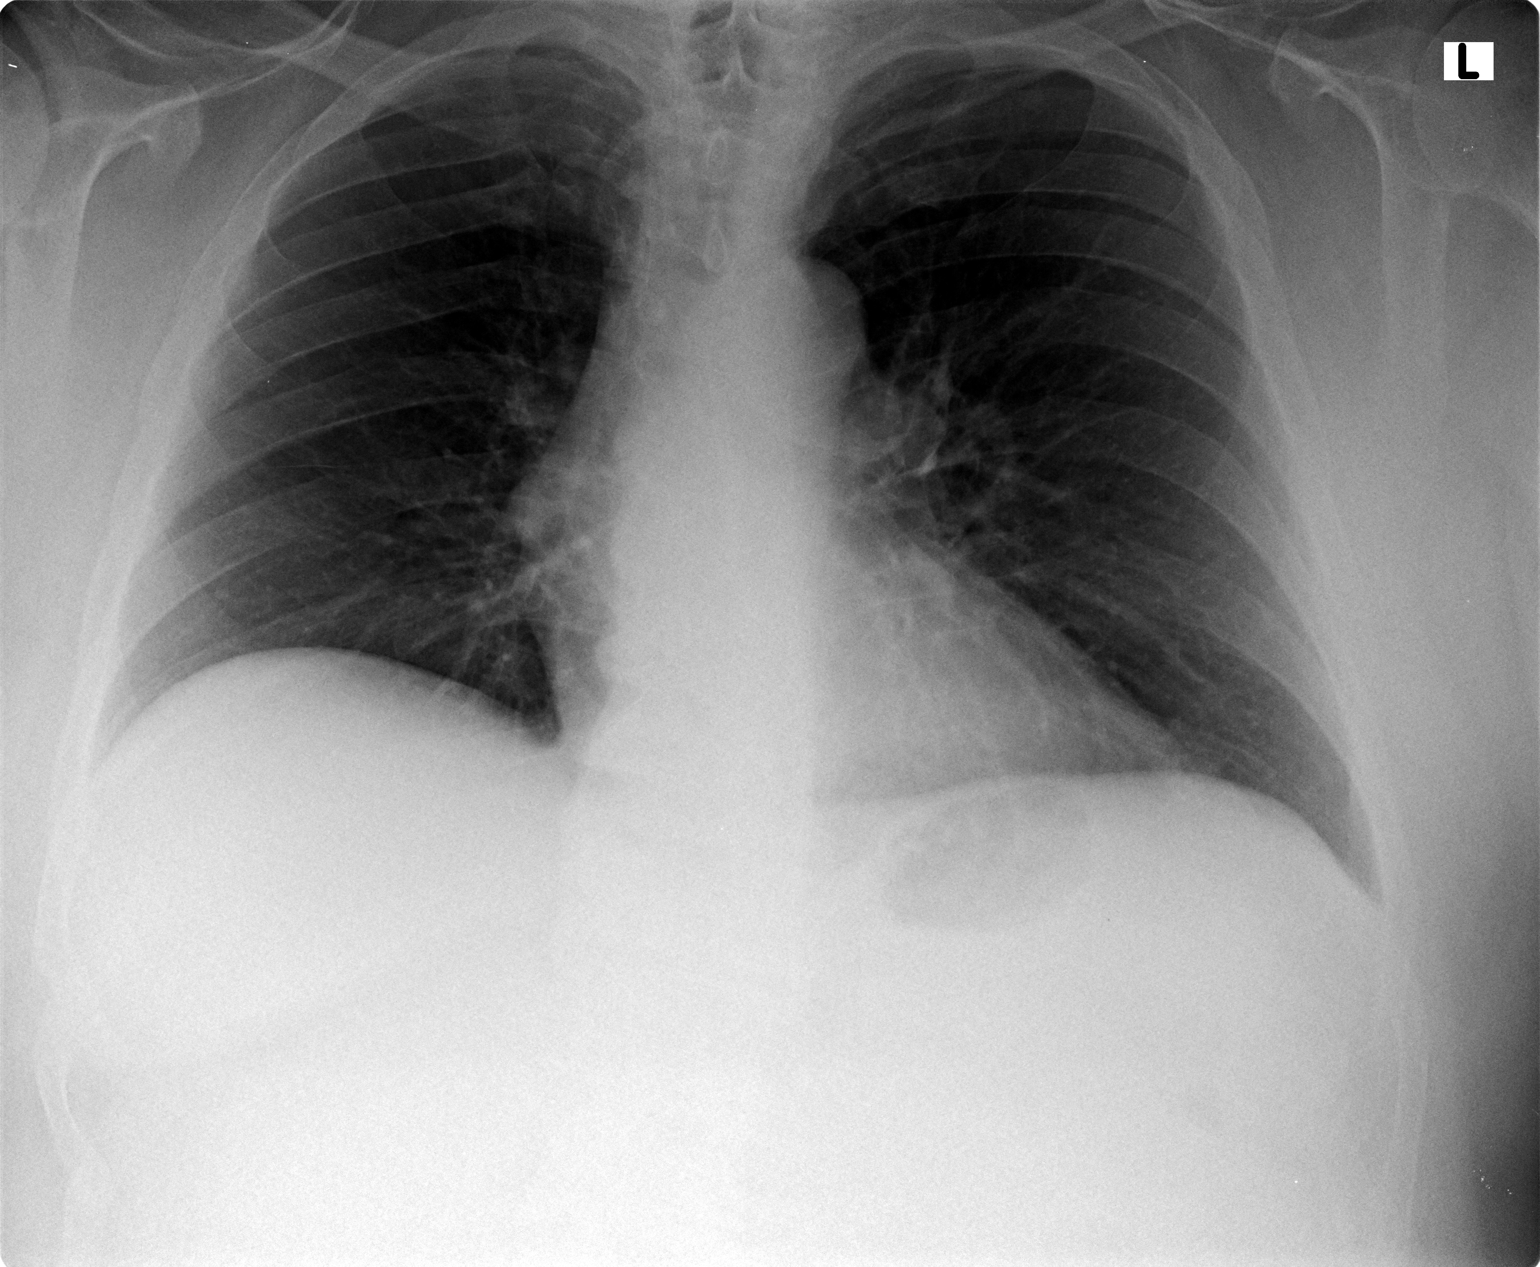

[view not recorded (2 of 2)]
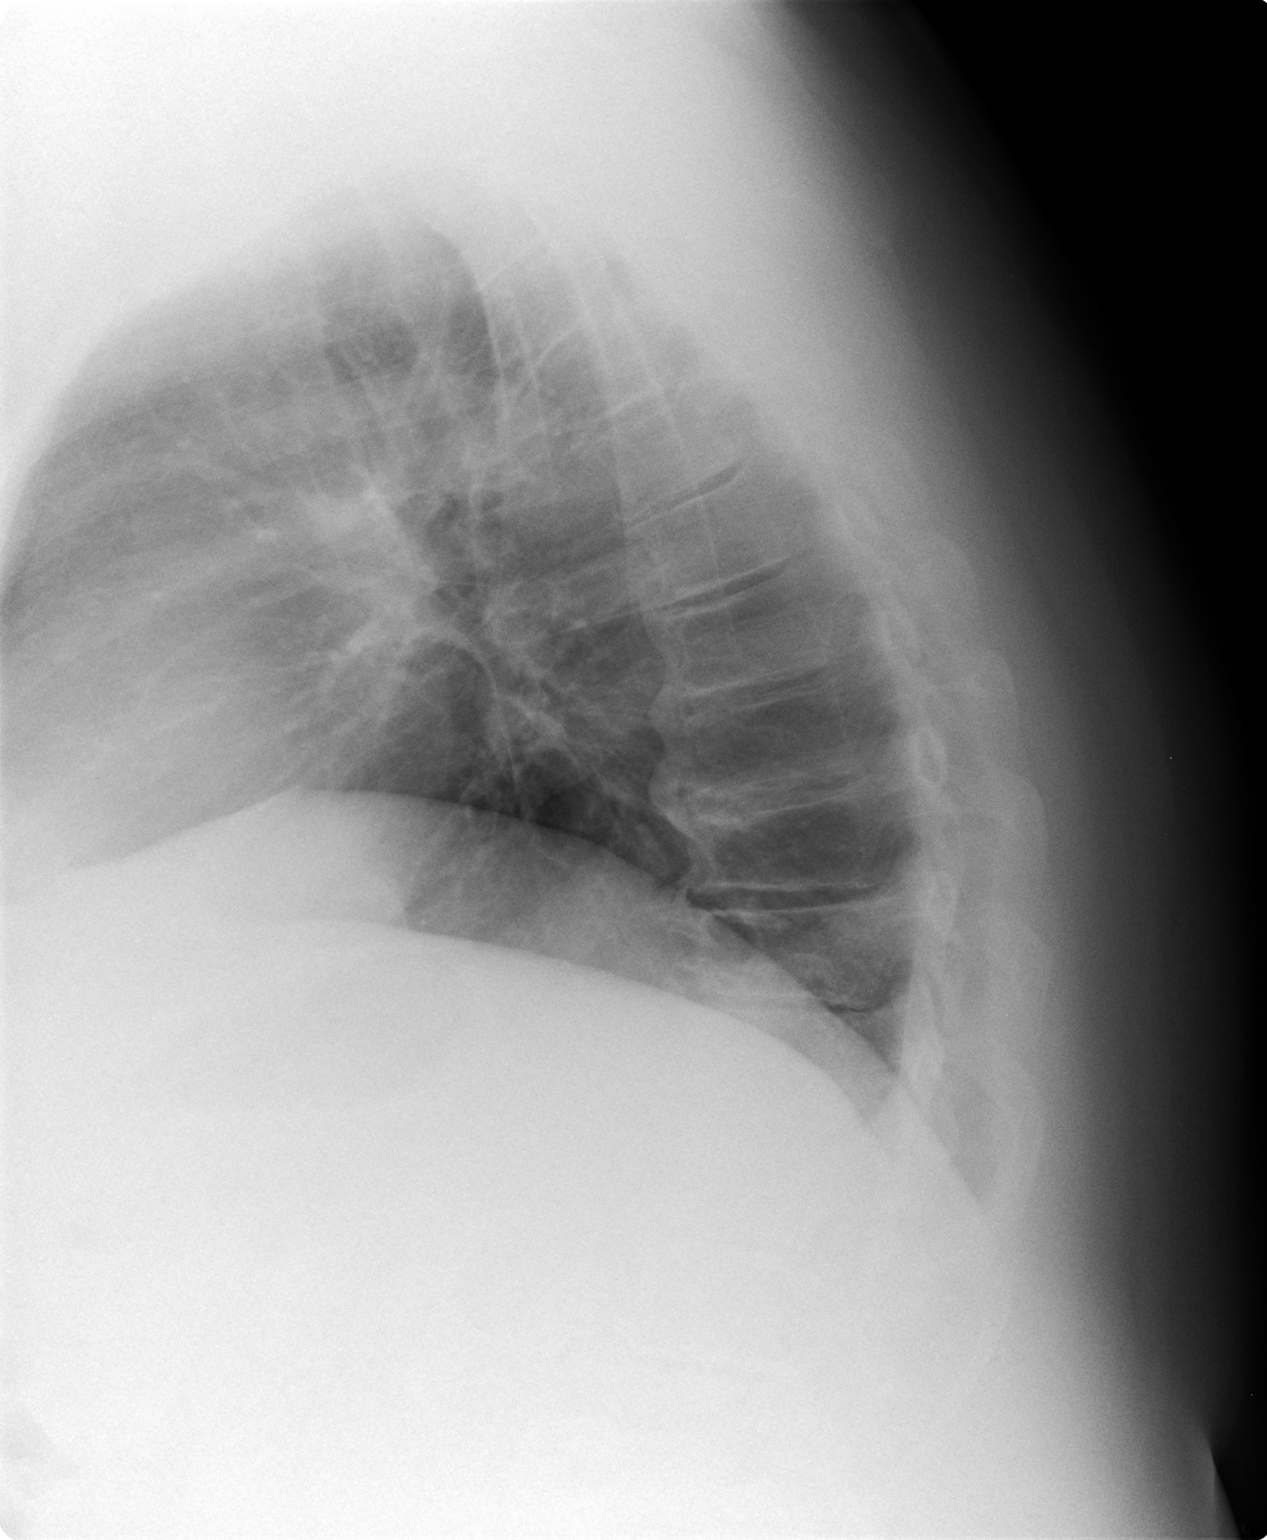

[2 of 2 positions shown; findings below may reference images not displayed]

FINDINGS: The heart size and mediastinal contours are within normal limits.
Both lungs are clear. The visualized skeletal structures are
unremarkable.
IMPRESSION: No active cardiopulmonary disease.

## 2017-04-20 ENCOUNTER — Telehealth: Payer: Self-pay

## 2017-05-12 ENCOUNTER — Ambulatory Visit (INDEPENDENT_AMBULATORY_CARE_PROVIDER_SITE_OTHER): Payer: Medicare Other | Admitting: Family Medicine

## 2017-05-12 ENCOUNTER — Encounter: Payer: Self-pay | Admitting: Family Medicine

## 2017-05-12 VITALS — BP 128/73 | HR 53 | Temp 97.8°F | Ht 71.0 in | Wt 314.0 lb

## 2017-05-12 DIAGNOSIS — Z1211 Encounter for screening for malignant neoplasm of colon: Secondary | ICD-10-CM | POA: Diagnosis not present

## 2017-05-12 DIAGNOSIS — I10 Essential (primary) hypertension: Secondary | ICD-10-CM

## 2017-05-12 DIAGNOSIS — E7800 Pure hypercholesterolemia, unspecified: Secondary | ICD-10-CM | POA: Diagnosis not present

## 2017-05-12 DIAGNOSIS — M5442 Lumbago with sciatica, left side: Secondary | ICD-10-CM

## 2017-05-12 DIAGNOSIS — E559 Vitamin D deficiency, unspecified: Secondary | ICD-10-CM

## 2017-05-12 DIAGNOSIS — E119 Type 2 diabetes mellitus without complications: Secondary | ICD-10-CM

## 2017-05-12 DIAGNOSIS — M5441 Lumbago with sciatica, right side: Secondary | ICD-10-CM | POA: Diagnosis not present

## 2017-05-12 DIAGNOSIS — N401 Enlarged prostate with lower urinary tract symptoms: Secondary | ICD-10-CM

## 2017-05-12 DIAGNOSIS — Z789 Other specified health status: Secondary | ICD-10-CM

## 2017-05-12 DIAGNOSIS — G8929 Other chronic pain: Secondary | ICD-10-CM | POA: Diagnosis not present

## 2017-05-12 LAB — URINALYSIS, COMPLETE
Bilirubin, UA: NEGATIVE
GLUCOSE, UA: NEGATIVE
KETONES UA: NEGATIVE
LEUKOCYTES UA: NEGATIVE
Nitrite, UA: NEGATIVE
PROTEIN UA: NEGATIVE
RBC UA: NEGATIVE
SPEC GRAV UA: 1.02 (ref 1.005–1.030)
Urobilinogen, Ur: 0.2 mg/dL (ref 0.2–1.0)
pH, UA: 5 (ref 5.0–7.5)

## 2017-05-12 LAB — MICROSCOPIC EXAMINATION
Bacteria, UA: NONE SEEN
Epithelial Cells (non renal): NONE SEEN /hpf (ref 0–10)
RBC, UA: NONE SEEN /hpf (ref 0–?)
Renal Epithel, UA: NONE SEEN /hpf
WBC, UA: NONE SEEN /hpf (ref 0–?)

## 2017-05-12 MED ORDER — RAMIPRIL 5 MG PO CAPS: 5.0000 mg | ORAL_CAPSULE | Freq: Every day | ORAL | 3 refills | Status: DC

## 2017-05-12 NOTE — Progress Notes (Signed)
Subjective:    Patient ID: Seth Martinez, male    DOB: 26-Aug-1950, 66 y.o.   MRN: [id number]  HPI Pt here for follow up and management of chronic medical problems which includes hyperlipidemia, diabetes and hypertension. He is taking medication regularly.  The patient today comes in to follow-up on his blood pressure.  He is due to have a rectal exam and get lab work today.  He is requesting a refill on his blood pressure medicine.  He is now taking 5 mg a day of the ramipril.  He has no other specific complaints.  The patient however does continue to have his ongoing back problems.  He has had no more issues with he is septic arthritis.  The patient continues to have concerns about his 74 year old mother who has severe arthritis.  The patient denies any chest pain or shortness of breath anymore than usual.  He has had no more issues or problems with septic arthritis.  He is due to get some dental work soon and has never taken any prophylactic antibiotics for this.  He also is due to get an eye exam and he will make sure that we get a copy of that report when it is done.  He refuses to do colonoscopies but promises to return and FOBT.  On his own, he increased the ramipril to 5 mg daily and his blood pressures are good with this.  He denies any chest pain or shortness of breath.  He denies any trouble with nausea vomiting or diarrhea other than eating certain foods with diarrhea blood in the stool or black tarry bowel movements.  He is passing his water without problems.   Patient Active Problem List   Diagnosis Date Noted  . BPH (benign prostatic hypertrophy) 12/26/2014  . Metabolic syndrome 56/81/2751  . Multiple drug allergies 09/28/2013  . Statin intolerance 09/28/2013  . Nocturia 07/25/2013  . Rash and nonspecific skin eruption 05/15/2013  . Morbid obesity ([place]) 05/04/2013  . Staphylococcus aureus septicemia, history of 05/04/2013  . Septic arthritis of multiple joints, history of  05/02/2013  . Hyperlipemia 12/28/2012  . Hypertension 12/28/2012  . Vitamin D deficiency 12/28/2012   Outpatient Encounter Prescriptions as of 05/12/2017  Medication Sig  . aspirin 325 MG tablet Take 325 mg by mouth daily.  Marland Kitchen azelastine (ASTELIN) 0.1 % nasal spray Place 1 spray into both nostrils 2 (two) times daily. Use in each nostril as directed  . cyclobenzaprine (FLEXERIL) 10 MG tablet Take 1 tablet (10 mg total) by mouth 3 (three) times daily as needed for muscle spasms.  Marland Kitchen EPINEPHrine (EPI-PEN) 0.3 mg/0.3 mL SOAJ injection Inject 0.3 mLs (0.3 mg total) into the muscle once.  Vanessa Kick Ethyl (VASCEPA) 1 g CAPS Take 2 g by mouth 2 (two) times daily.  Marland Kitchen oxyCODONE-acetaminophen (PERCOCET/ROXICET) 5-325 MG tablet Take 1 tablet by mouth every 8 (eight) hours as needed.  . predniSONE (DELTASONE) 10 MG tablet Take 1 tab QID x 2 days, then 1 tab TID x 2 days, then 1 tab BId x 2 days, then 1 tab QD x 2 days  . ramipril (ALTACE) 2.5 MG capsule Take 1 capsule (2.5 mg total) by mouth daily.  Marland Kitchen oxyCODONE-acetaminophen (PERCOCET/ROXICET) 5-325 MG tablet Take 1 tablet by mouth every 8 (eight) hours as needed for severe pain. (Patient not taking: Reported on 05/12/2017)  . oxyCODONE-acetaminophen (PERCOCET/ROXICET) 5-325 MG tablet Take 1 tablet by mouth every 8 (eight) hours as needed for severe pain. (Patient not  taking: Reported on 05/12/2017)   No facility-administered encounter medications on file as of 05/12/2017.       Review of Systems  Constitutional: Negative.   HENT: Negative.   Eyes: Negative.   Respiratory: Negative.   Cardiovascular: Negative.        Elevated BP (some stress)  Gastrointestinal: Negative.   Endocrine: Negative.   Genitourinary: Negative.   Musculoskeletal: Negative.   Skin: Negative.   Allergic/Immunologic: Negative.   Neurological: Negative.   Hematological: Negative.   Psychiatric/Behavioral: Negative.        Objective:   Physical Exam    Constitutional: He is oriented to person, place, and time. He appears well-developed and well-nourished. No distress.  Pleasant and alert morbidly obese patient  HENT:  Head: Normocephalic and atraumatic.  Right Ear: External ear normal.  Left Ear: External ear normal.  Nose: Nose normal.  Mouth/Throat: Oropharynx is clear and moist. No oropharyngeal exudate.  Eyes: Pupils are equal, round, and reactive to light. Conjunctivae and EOM are normal. Right eye exhibits no discharge. Left eye exhibits no discharge. No scleral icterus.  Neck: Normal range of motion. Neck supple. No thyromegaly present.  No bruits thyromegaly or anterior cervical adenopathy  Cardiovascular: Normal rate, regular rhythm, normal heart sounds and intact distal pulses.   No murmur heard. Heart is regular at 60/min  Pulmonary/Chest: Effort normal and breath sounds normal. No respiratory distress. He has no wheezes. He has no rales. He exhibits no tenderness.  Clear anteriorly and posteriorly and no axillary adenopathy  Abdominal: Soft. Bowel sounds are normal. He exhibits no mass. There is no tenderness. There is no rebound and no guarding.  No liver or spleen enlargement no epigastric tenderness no masses no inguinal adenopathy but morbidly obese  Genitourinary: Rectum normal, prostate normal and penis normal.  Genitourinary Comments: The prostate is enlarged but soft and smooth.  There were no lumps or masses.  There were no rectal masses.  The external genitalia were within normal limits with no inguinal hernias being palpated.  Musculoskeletal: He exhibits no edema or tenderness.  Limited range of motion with sitting and laying due to ongoing chronic back pain  Lymphadenopathy:    He has no cervical adenopathy.  Neurological: He is alert and oriented to person, place, and time. He has normal reflexes. No cranial nerve deficit.  Skin: Skin is warm and dry. No rash noted.  Psychiatric: He has a normal mood and  affect. His behavior is normal. Judgment and thought content normal.  Nursing note and vitals reviewed.  BP 128/73 (BP Location: Left Arm)   Pulse (!) 53   Temp 97.8 F (36.6 C) (Oral)   Ht '5\' 11"'  (1.803 m)   Wt (!) 314 lb (142.4 kg)   BMI 43.79 kg/m         Assessment & Plan:  1. Essential hypertension -The patient will continue on ramipril 5 mg daily as his blood pressure is good today with this dose - BMP8+EGFR - CBC with Differential/Platelet - Hepatic function panel  2. Vitamin D deficiency -Continue vitamin D replacement pending results of lab work - CBC with Differential/Platelet - VITAMIN D 25 Hydroxy (Vit-D Deficiency, Fractures)  3. Pure hypercholesterolemia -Continue with aggressive therapeutic lifestyle changes with diet and exercise - CBC with Differential/Platelet - Lipid panel  4. Type 2 diabetes mellitus without complication, without long-term current use of insulin (HCC) -Continue to check blood sugars periodically at home and continue to lose weight through diet and exercise -  CBC with Differential/Platelet  5. Benign prostatic hyperplasia with lower urinary tract symptoms, symptom details unspecified -The prostate is enlarged but patient is having no symptoms with this. - CBC with Differential/Platelet - PSA, total and free - Urinalysis, Complete  6. Chronic low back pain with bilateral sciatica, unspecified back pain laterality -This is an ongoing problem and he will continue take his pain medicine as needed for this at home  7. Statin intolerance -Continue with aggressive therapeutic lifestyle changes  8. Morbid obesity due to excess calories ([place]) -Continue with aggressive therapeutic lifestyle changes and all efforts to lose weight to lower body mass index  9. Special screening for malignant neoplasms, colon -The patient was encouraged to return his FOBT since he refuses to do colonoscopies  No orders of the defined types were placed in  this encounter.  Patient Instructions                       Medicare Annual Wellness Visit  [place] and the medical providers at [facility] strive to bring you the best medical care.  In doing so we not only want to address your current medical conditions and concerns but also to detect new conditions early and prevent illness, disease and health-related problems.    Medicare offers a yearly Wellness Visit which allows our clinical staff to assess your need for preventative services including immunizations, lifestyle education, counseling to decrease risk of preventable diseases and screening for fall risk and other medical concerns.    This visit is provided free of charge (no copay) for all Medicare recipients. The clinical pharmacists at [facility] have begun to conduct these Wellness Visits which will also include a thorough review of all your medications.    As you primary medical provider recommend that you make an appointment for your Annual Wellness Visit if you have not done so already this year.  You may set up this appointment before you leave today or you may call back ([phone]) and schedule an appointment.  Please make sure when you call that you mention that you are scheduling your Annual Wellness Visit with the clinical pharmacist so that the appointment may be made for the proper length of time.     Continue current medications. Continue good therapeutic lifestyle changes which include good diet and exercise. Fall precautions discussed with patient. If an FOBT was given today- please return it to our front desk. If you are over 44 years old - you may need Prevnar 90 or the adult Pneumonia vaccine.  **Flu shots are available--- please call and schedule a FLU-CLINIC appointment**  After your visit with [place] today you will receive a survey in the mail or online from [employer] regarding your care with [place]. Please take a moment to  fill this out. Your feedback is very important to [place] as you can help [place] better understand your patient needs as well as improve your experience and satisfaction. WE CARE ABOUT YOU!!!   Please return the FOBT Please get your eye exam Continue to work on losing weight Check blood sugars periodically at home Stay on the ramipril 5 mg daily We will call with lab work results as soon as these results become available  Arrie Senate MD

## 2017-05-12 NOTE — Patient Instructions (Addendum)
Medicare Annual Wellness Visit  [facility] and the medical providers at [facility] strive to bring you the best medical care.  In doing so we not only want to address your current medical conditions and concerns but also to detect new conditions early and prevent illness, disease and health-related problems.    Medicare offers a yearly Wellness Visit which allows our clinical staff to assess your need for preventative services including immunizations, lifestyle education, counseling to decrease risk of preventable diseases and screening for fall risk and other medical concerns.    This visit is provided free of charge (no copay) for all Medicare recipients. The clinical pharmacists at [facility] have begun to conduct these Wellness Visits which will also include a thorough review of all your medications.    As you primary medical provider recommend that you make an appointment for your Annual Wellness Visit if you have not done so already this year.  You may set up this appointment before you leave today or you may call back [phone] and schedule an appointment.  Please make sure when you call that you mention that you are scheduling your Annual Wellness Visit with the clinical pharmacist so that the appointment may be made for the proper length of time.     Continue current medications. Continue good therapeutic lifestyle changes which include good diet and exercise. Fall precautions discussed with patient. If an FOBT was given today- please return it to our front desk. If you are over 68 years old - you may need Prevnar 13 or the adult Pneumonia vaccine.  **Flu shots are available--- please call and schedule a FLU-CLINIC appointment**  After your visit with us today you will receive a survey in the mail or online from [employer] regarding your care with us. Please take a moment to fill this out. Your feedback is very  important to us as you can help us better understand your patient needs as well as improve your experience and satisfaction. WE CARE ABOUT YOU!!!   Please return the FOBT Please get your eye exam Continue to work on losing weight Check blood sugars periodically at home Stay on the ramipril 5 mg daily We will call with lab work results as soon as these results become available The patient will check with his infectious disease doctor and/or his orthopedist regarding his upcoming dental work to make sure that he does not need any prophylactic antibiotics

## 2017-05-13 LAB — BMP8+EGFR
BUN / CREAT RATIO: 13 (ref 10–24)
BUN: 14 mg/dL (ref 8–27)
CHLORIDE: 104 mmol/L (ref 96–106)
CO2: 19 mmol/L — ABNORMAL LOW (ref 20–29)
Calcium: 9.5 mg/dL (ref 8.6–10.2)
Creatinine, Ser: 1.11 mg/dL (ref 0.76–1.27)
GFR calc non Af Amer: 68 mL/min/{1.73_m2} (ref >59)
GFR, EST AFRICAN AMERICAN: 78 mL/min/{1.73_m2} (ref >59)
Glucose: 118 mg/dL — ABNORMAL HIGH (ref 65–99)
Potassium: 4.4 mmol/L (ref 3.5–5.2)
SODIUM: 141 mmol/L (ref 134–144)

## 2017-05-13 LAB — CBC WITH DIFFERENTIAL/PLATELET
BASOS ABS: 0.1 10*3/uL (ref 0.0–0.2)
Basos: 1 %
EOS (ABSOLUTE): 0.3 10*3/uL (ref 0.0–0.4)
Eos: 6 %
HEMOGLOBIN: 15.5 g/dL (ref 13.0–17.7)
Hematocrit: 44.9 % (ref 37.5–51.0)
Immature Grans (Abs): 0 10*3/uL (ref 0.0–0.1)
Immature Granulocytes: 0 %
LYMPHS ABS: 1.7 10*3/uL (ref 0.7–3.1)
Lymphs: 32 %
MCH: 30.4 pg (ref 26.6–33.0)
MCHC: 34.5 g/dL (ref 31.5–35.7)
MCV: 88 fL (ref 79–97)
MONOCYTES: 8 %
MONOS ABS: 0.4 10*3/uL (ref 0.1–0.9)
NEUTROS ABS: 2.8 10*3/uL (ref 1.4–7.0)
Neutrophils: 53 %
PLATELETS: 224 10*3/uL (ref 150–379)
RBC: 5.1 x10E6/uL (ref 4.14–5.80)
RDW: 14.6 % (ref 12.3–15.4)
WBC: 5.3 10*3/uL (ref 3.4–10.8)

## 2017-05-13 LAB — LIPID PANEL
CHOLESTEROL TOTAL: 162 mg/dL (ref 100–199)
Chol/HDL Ratio: 3.8 ratio (ref 0.0–5.0)
HDL: 43 mg/dL (ref >39)
LDL Calculated: 92 mg/dL (ref 0–99)
Triglycerides: 135 mg/dL (ref 0–149)
VLDL CHOLESTEROL CAL: 27 mg/dL (ref 5–40)

## 2017-05-13 LAB — HEPATIC FUNCTION PANEL
ALT: 33 IU/L (ref 0–44)
AST: 36 IU/L (ref 0–40)
Albumin: 4.8 g/dL (ref 3.6–4.8)
Alkaline Phosphatase: 61 IU/L (ref 39–117)
BILIRUBIN TOTAL: 0.6 mg/dL (ref 0.0–1.2)
BILIRUBIN, DIRECT: 0.17 mg/dL (ref 0.00–0.40)
Total Protein: 7.2 g/dL (ref 6.0–8.5)

## 2017-05-13 LAB — PSA, TOTAL AND FREE
PROSTATE SPECIFIC AG, SERUM: 0.8 ng/mL (ref 0.0–4.0)
PSA, Free Pct: 37.5 %
PSA, Free: 0.3 ng/mL

## 2017-05-13 LAB — VITAMIN D 25 HYDROXY (VIT D DEFICIENCY, FRACTURES): Vit D, 25-Hydroxy: 37.2 ng/mL (ref 30.0–100.0)

## 2017-09-24 ENCOUNTER — Ambulatory Visit (INDEPENDENT_AMBULATORY_CARE_PROVIDER_SITE_OTHER): Payer: Medicare Other | Admitting: Family Medicine

## 2017-09-24 ENCOUNTER — Encounter: Payer: Self-pay | Admitting: Family Medicine

## 2017-09-24 ENCOUNTER — Ambulatory Visit (INDEPENDENT_AMBULATORY_CARE_PROVIDER_SITE_OTHER): Payer: Medicare Other

## 2017-09-24 VITALS — BP 139/87 | HR 58 | Temp 97.8°F | Ht 71.0 in | Wt 318.0 lb

## 2017-09-24 DIAGNOSIS — Z789 Other specified health status: Secondary | ICD-10-CM

## 2017-09-24 DIAGNOSIS — M5441 Lumbago with sciatica, right side: Secondary | ICD-10-CM | POA: Diagnosis not present

## 2017-09-24 DIAGNOSIS — M5442 Lumbago with sciatica, left side: Secondary | ICD-10-CM | POA: Diagnosis not present

## 2017-09-24 DIAGNOSIS — E7800 Pure hypercholesterolemia, unspecified: Secondary | ICD-10-CM | POA: Diagnosis not present

## 2017-09-24 DIAGNOSIS — G8929 Other chronic pain: Secondary | ICD-10-CM

## 2017-09-24 DIAGNOSIS — N401 Enlarged prostate with lower urinary tract symptoms: Secondary | ICD-10-CM

## 2017-09-24 DIAGNOSIS — E119 Type 2 diabetes mellitus without complications: Secondary | ICD-10-CM

## 2017-09-24 DIAGNOSIS — I10 Essential (primary) hypertension: Secondary | ICD-10-CM

## 2017-09-24 DIAGNOSIS — E559 Vitamin D deficiency, unspecified: Secondary | ICD-10-CM | POA: Diagnosis not present

## 2017-09-24 LAB — BAYER DCA HB A1C WAIVED: HB A1C: 5.5 % (ref <7.0)

## 2017-09-24 NOTE — Patient Instructions (Addendum)
Medicare Annual Wellness Visit  [place] and the medical providers at [facility] strive to bring you the best medical care.  In doing so we not only want to address your current medical conditions and concerns but also to detect new conditions early and prevent illness, disease and health-related problems.    Medicare offers a yearly Wellness Visit which allows our clinical staff to assess your need for preventative services including immunizations, lifestyle education, counseling to decrease risk of preventable diseases and screening for fall risk and other medical concerns.    This visit is provided free of charge (no copay) for all Medicare recipients. The clinical pharmacists at [facility] have begun to conduct these Wellness Visits which will also include a thorough review of all your medications.    As you primary medical provider recommend that you make an appointment for your Annual Wellness Visit if you have not done so already this year.  You may set up this appointment before you leave today or you may call back [phone] and schedule an appointment.  Please make sure when you call that you mention that you are scheduling your Annual Wellness Visit with the clinical pharmacist so that the appointment may be made for the proper length of time.     Continue current medications. Continue good therapeutic lifestyle changes which include good diet and exercise. Fall precautions discussed with patient. If an FOBT was given today- please return it to our front desk. If you are over 69 years old - you may need Prevnar 13 or the adult Pneumonia vaccine.  **Flu shots are available--- please call and schedule a FLU-CLINIC appointment**  After your visit with us today you will receive a survey in the mail or online from [employer] regarding your care with us. Please take a moment to fill this out. Your feedback is very  important to us as you can help us better understand your patient needs as well as improve your experience and satisfaction. WE CARE ABOUT YOU!!!   Stay active physically Do not climb Watch weight closely and avoid sodium intake and reduce sugar intake as much as possible Drink plenty of fluids and stay well-hydrated We will call with lab work results and chest x-ray results as soon as these results are returned Use nasal saline frequently in each nostril Keep the house as cool as possible and continue to use cool mist humidification

## 2017-09-24 NOTE — Progress Notes (Signed)
Subjective:    Patient ID: Seth Martinez, male    DOB: 10/15/1950, 67 y.o.   MRN: [id number]  HPI Pt here for follow up and management of chronic medical problems which includes hyperlipidemia and diabetes. He is taking medication regularly.  Patient is doing well today other than some slight back pain on the left side that he thinks came about as a result of something he had done.  It is not severe and hopefully it will get better.  He denies any chest pain or shortness of breath anymore than usual other than having a dry cough.  He does have a heat pump and does use a humidifier in the house and tries to keep it is cool as possible even though he has a 41 year old mother that likes to stay warmer.  He denies any trouble with nausea vomiting diarrhea blood in the stool or black tarry bowel movements.  He is passing his water well although does have some dribbling post voiding at times.  He will be given an FOBT to return and has already had his chest x-ray and lab work done.     Patient Active Problem List   Diagnosis Date Noted  . BPH (benign prostatic hypertrophy) 12/26/2014  . Metabolic syndrome 50/53/9767  . Multiple drug allergies 09/28/2013  . Statin intolerance 09/28/2013  . Nocturia 07/25/2013  . Rash and nonspecific skin eruption 05/15/2013  . Morbid obesity ([place]) 05/04/2013  . Staphylococcus aureus septicemia, history of 05/04/2013  . Septic arthritis of multiple joints, history of 05/02/2013  . Hyperlipemia 12/28/2012  . Hypertension 12/28/2012  . Vitamin D deficiency 12/28/2012   Outpatient Encounter Medications as of 09/24/2017  Medication Sig  . aspirin 325 MG tablet Take 325 mg by mouth daily.  Marland Kitchen azelastine (ASTELIN) 0.1 % nasal spray Place 1 spray into both nostrils 2 (two) times daily. Use in each nostril as directed  . cyclobenzaprine (FLEXERIL) 10 MG tablet Take 1 tablet (10 mg total) by mouth 3 (three) times daily as needed for muscle spasms.  Marland Kitchen EPINEPHrine  (EPI-PEN) 0.3 mg/0.3 mL SOAJ injection Inject 0.3 mLs (0.3 mg total) into the muscle once.  Vanessa Kick Ethyl (VASCEPA) 1 g CAPS Take 2 g by mouth 2 (two) times daily.  Marland Kitchen oxyCODONE-acetaminophen (PERCOCET/ROXICET) 5-325 MG tablet Take 1 tablet by mouth every 8 (eight) hours as needed.  . ramipril (ALTACE) 5 MG capsule Take 1 capsule (5 mg total) by mouth daily.  Marland Kitchen oxyCODONE-acetaminophen (PERCOCET/ROXICET) 5-325 MG tablet Take 1 tablet by mouth every 8 (eight) hours as needed for severe pain. (Patient not taking: Reported on 05/12/2017)  . oxyCODONE-acetaminophen (PERCOCET/ROXICET) 5-325 MG tablet Take 1 tablet by mouth every 8 (eight) hours as needed for severe pain. (Patient not taking: Reported on 05/12/2017)  . [DISCONTINUED] predniSONE (DELTASONE) 10 MG tablet Take 1 tab QID x 2 days, then 1 tab TID x 2 days, then 1 tab BId x 2 days, then 1 tab QD x 2 days   No facility-administered encounter medications on file as of 09/24/2017.       Review of Systems  Constitutional: Negative.   HENT: Negative.   Eyes: Negative.   Respiratory: Negative.   Cardiovascular: Negative.   Gastrointestinal: Negative.   Endocrine: Negative.   Genitourinary: Negative.   Musculoskeletal: Negative.   Skin: Negative.   Allergic/Immunologic: Negative.   Neurological: Negative.   Hematological: Negative.   Psychiatric/Behavioral: Negative.        Objective:   Physical Exam  Constitutional: He is oriented to person, place, and time. He appears well-developed and well-nourished. No distress.  HENT:  Head: Normocephalic and atraumatic.  Right Ear: External ear normal.  Left Ear: External ear normal.  Mouth/Throat: Oropharynx is clear and moist. No oropharyngeal exudate.  Nasal and turbinate congestion bilaterally  Eyes: Conjunctivae and EOM are normal. Pupils are equal, round, and reactive to light. Right eye exhibits no discharge. Left eye exhibits no discharge. No scleral icterus.  Neck: Normal range  of motion. Neck supple. No thyromegaly present.  No bruits thyromegaly or anterior cervical adenopathy  Cardiovascular: Normal rate, regular rhythm, normal heart sounds and intact distal pulses.  No murmur heard. Heart had a regular rate and rhythm at 72/min  Pulmonary/Chest: Effort normal and breath sounds normal. No respiratory distress. He has no wheezes. He has no rales. He exhibits no tenderness.  Chest is clear anteriorly and posteriorly with no axillary adenopathy  Abdominal: Soft. Bowel sounds are normal. He exhibits no mass. There is no tenderness. There is no rebound and no guarding.  Severe abdominal obesity without liver or spleen enlargement tenderness masses or bruits.  Musculoskeletal: Normal range of motion. He exhibits no edema.  Lymphadenopathy:    He has no cervical adenopathy.  Neurological: He is alert and oriented to person, place, and time. He has normal reflexes. No cranial nerve deficit.  Skin: Skin is warm and dry. No rash noted.  Psychiatric: He has a normal mood and affect. His behavior is normal. Judgment and thought content normal.  Positive and kind attitude.  Nursing note and vitals reviewed.  BP 139/87 (BP Location: Left Arm)   Pulse (!) 58   Temp 97.8 F (36.6 C) (Oral)   Ht '5\' 11"'  (1.803 m)   BMI 43.79 kg/m         Assessment & Plan:  1. Pure hypercholesterolemia -Continue as aggressive therapeutic lifestyle changes as possible pending results of lab work - CBC with Differential/Platelet - Lipid panel - DG Chest 2 View; Future  2. Vitamin D deficiency -Continue vitamin D replacement pending results of lab work - CBC with Differential/Platelet - VITAMIN D 25 Hydroxy (Vit-D Deficiency, Fractures)  3. Essential hypertension -To need to watch sodium intake and lose weight as much as possible - BMP8+EGFR - CBC with Differential/Platelet - Hepatic function panel - DG Chest 2 View; Future  4. Type 2 diabetes mellitus without complication,  without long-term current use of insulin (HCC) -Check blood sugars periodically and follow diet closely pending results of lab work - CBC with Differential/Platelet - Bayer [place] Hb A1c Waived  5. Benign prostatic hyperplasia with lower urinary tract symptoms, symptom details unspecified -Patient does complain of dribbling post voiding. - CBC with Differential/Platelet  6. Statin intolerance -Continue with aggressive therapeutic lifestyle changes  7. Morbid obesity due to excess calories ([place]) -Continue with weight loss through diet and exercise  8. Chronic low back pain with bilateral sciatica, unspecified back pain laterality -This seems to be stable currently.  Patient Instructions                       Medicare Annual Wellness Visit  [place] and the medical providers at [place] strive to bring you the best medical care.  In doing so we not only want to address your current medical conditions and concerns but also to detect new conditions early and prevent illness, disease and health-related problems.    Medicare offers a yearly  Wellness Visit which allows our clinical staff to assess your need for preventative services including immunizations, lifestyle education, counseling to decrease risk of preventable diseases and screening for fall risk and other medical concerns.    This visit is provided free of charge (no copay) for all Medicare recipients. The clinical pharmacists at [facility] have begun to conduct these Wellness Visits which will also include a thorough review of all your medications.    As you primary medical provider recommend that you make an appointment for your Annual Wellness Visit if you have not done so already this year.  You may set up this appointment before you leave today or you may call back [phone]) and schedule an appointment.  Please make sure when you call that you mention that you are scheduling your  Annual Wellness Visit with the clinical pharmacist so that the appointment may be made for the proper length of time.     Continue current medications. Continue good therapeutic lifestyle changes which include good diet and exercise. Fall precautions discussed with patient. If an FOBT was given today- please return it to our front desk. If you are over 69 years old - you may need Prevnar 63 or the adult Pneumonia vaccine.  **Flu shots are available--- please call and schedule a FLU-CLINIC appointment**  After your visit with [place] today you will receive a survey in the mail or online from [employer] regarding your care with [place]. Please take a moment to fill this out. Your feedback is very important to [place] as you can help [place] better understand your patient needs as well as improve your experience and satisfaction. WE CARE ABOUT YOU!!!   Stay active physically Do not climb Watch weight closely and avoid sodium intake and reduce sugar intake as much as possible Drink plenty of fluids and stay well-hydrated We will call with lab work results and chest x-ray results as soon as these results are returned Use nasal saline frequently in each nostril Keep the house as cool as possible and continue to use cool mist humidification    Arrie Senate MD

## 2017-09-25 LAB — CBC WITH DIFFERENTIAL/PLATELET
BASOS: 1 %
Basophils Absolute: 0.1 10*3/uL (ref 0.0–0.2)
EOS (ABSOLUTE): 0.3 10*3/uL (ref 0.0–0.4)
EOS: 5 %
HEMATOCRIT: 45.8 % (ref 37.5–51.0)
Hemoglobin: 15.7 g/dL (ref 13.0–17.7)
Immature Grans (Abs): 0 10*3/uL (ref 0.0–0.1)
Immature Granulocytes: 0 %
LYMPHS ABS: 1.9 10*3/uL (ref 0.7–3.1)
Lymphs: 34 %
MCH: 29.8 pg (ref 26.6–33.0)
MCHC: 34.3 g/dL (ref 31.5–35.7)
MCV: 87 fL (ref 79–97)
MONOS ABS: 0.5 10*3/uL (ref 0.1–0.9)
Monocytes: 9 %
NEUTROS ABS: 2.9 10*3/uL (ref 1.4–7.0)
Neutrophils: 51 %
PLATELETS: 226 10*3/uL (ref 150–379)
RBC: 5.27 x10E6/uL (ref 4.14–5.80)
RDW: 14.2 % (ref 12.3–15.4)
WBC: 5.7 10*3/uL (ref 3.4–10.8)

## 2017-09-25 LAB — HEPATIC FUNCTION PANEL
ALT: 28 IU/L (ref 0–44)
AST: 28 IU/L (ref 0–40)
Albumin: 4.4 g/dL (ref 3.6–4.8)
Alkaline Phosphatase: 57 IU/L (ref 39–117)
BILIRUBIN TOTAL: 0.5 mg/dL (ref 0.0–1.2)
Bilirubin, Direct: 0.16 mg/dL (ref 0.00–0.40)
TOTAL PROTEIN: 6.8 g/dL (ref 6.0–8.5)

## 2017-09-25 LAB — BMP8+EGFR
BUN / CREAT RATIO: 15 (ref 10–24)
BUN: 17 mg/dL (ref 8–27)
CO2: 23 mmol/L (ref 20–29)
CREATININE: 1.14 mg/dL (ref 0.76–1.27)
Calcium: 8.9 mg/dL (ref 8.6–10.2)
Chloride: 104 mmol/L (ref 96–106)
GFR calc Af Amer: 76 mL/min/{1.73_m2} (ref >59)
GFR, EST NON AFRICAN AMERICAN: 66 mL/min/{1.73_m2} (ref >59)
Glucose: 104 mg/dL — ABNORMAL HIGH (ref 65–99)
POTASSIUM: 4.5 mmol/L (ref 3.5–5.2)
SODIUM: 142 mmol/L (ref 134–144)

## 2017-09-25 LAB — LIPID PANEL
CHOL/HDL RATIO: 3.9 ratio (ref 0.0–5.0)
Cholesterol, Total: 160 mg/dL (ref 100–199)
HDL: 41 mg/dL (ref >39)
LDL Calculated: 89 mg/dL (ref 0–99)
Triglycerides: 151 mg/dL — ABNORMAL HIGH (ref 0–149)
VLDL Cholesterol Cal: 30 mg/dL (ref 5–40)

## 2017-09-25 LAB — VITAMIN D 25 HYDROXY (VIT D DEFICIENCY, FRACTURES): VIT D 25 HYDROXY: 27.9 ng/mL — AB (ref 30.0–100.0)

## 2017-11-03 ENCOUNTER — Ambulatory Visit: Payer: Medicare Other | Admitting: *Deleted

## 2018-02-15 ENCOUNTER — Other Ambulatory Visit: Payer: Self-pay | Admitting: *Deleted

## 2018-02-15 ENCOUNTER — Ambulatory Visit (INDEPENDENT_AMBULATORY_CARE_PROVIDER_SITE_OTHER): Payer: Medicare Other | Admitting: Family Medicine

## 2018-02-15 ENCOUNTER — Encounter: Payer: Self-pay | Admitting: Family Medicine

## 2018-02-15 VITALS — BP 139/79 | HR 55 | Temp 97.5°F | Ht 71.0 in | Wt 316.0 lb

## 2018-02-15 DIAGNOSIS — E559 Vitamin D deficiency, unspecified: Secondary | ICD-10-CM

## 2018-02-15 DIAGNOSIS — Z789 Other specified health status: Secondary | ICD-10-CM

## 2018-02-15 DIAGNOSIS — I10 Essential (primary) hypertension: Secondary | ICD-10-CM | POA: Diagnosis not present

## 2018-02-15 DIAGNOSIS — M545 Low back pain: Secondary | ICD-10-CM

## 2018-02-15 DIAGNOSIS — N401 Enlarged prostate with lower urinary tract symptoms: Secondary | ICD-10-CM | POA: Diagnosis not present

## 2018-02-15 DIAGNOSIS — E7800 Pure hypercholesterolemia, unspecified: Secondary | ICD-10-CM

## 2018-02-15 DIAGNOSIS — E119 Type 2 diabetes mellitus without complications: Secondary | ICD-10-CM

## 2018-02-15 LAB — BAYER DCA HB A1C WAIVED: HB A1C (BAYER DCA - WAIVED): 5.4 % (ref <7.0)

## 2018-02-15 MED ORDER — METHYLPREDNISOLONE ACETATE 80 MG/ML IJ SUSP
60.0000 mg | Freq: Once | INTRAMUSCULAR | Status: AC
  Administered 2018-02-15: 60 mg via INTRAMUSCULAR

## 2018-02-15 MED ORDER — OXYCODONE-ACETAMINOPHEN 5-325 MG PO TABS: 1.0000 | ORAL_TABLET | Freq: Three times a day (TID) | ORAL | 0 refills | Status: DC | PRN

## 2018-02-15 MED ORDER — PREDNISONE 10 MG PO TABS: ORAL_TABLET | ORAL | 0 refills | Status: DC

## 2018-02-15 NOTE — Patient Instructions (Addendum)
Medicare Annual Wellness Visit  [place] and the medical providers at [facility] strive to bring you the best medical care.  In doing so we not only want to address your current medical conditions and concerns but also to detect new conditions early and prevent illness, disease and health-related problems.    Medicare offers a yearly Wellness Visit which allows our clinical staff to assess your need for preventative services including immunizations, lifestyle education, counseling to decrease risk of preventable diseases and screening for fall risk and other medical concerns.    This visit is provided free of charge (no copay) for all Medicare recipients. The clinical pharmacists at [facility] have begun to conduct these Wellness Visits which will also include a thorough review of all your medications.    As you primary medical provider recommend that you make an appointment for your Annual Wellness Visit if you have not done so already this year.  You may set up this appointment before you leave today or you may call back [phone] and schedule an appointment.  Please make sure when you call that you mention that you are scheduling your Annual Wellness Visit with the clinical pharmacist so that the appointment may be made for the proper length of time.     Continue current medications. Continue good therapeutic lifestyle changes which include good diet and exercise. Fall precautions discussed with patient. If an FOBT was given today- please return it to our front desk. If you are over 69 years old - you may need Prevnar 13 or the adult Pneumonia vaccine.  **Flu shots are available--- please call and schedule a FLU-CLINIC appointment**  After your visit with us today you will receive a survey in the mail or online from [employer] regarding your care with us. Please take a moment to fill this out. Your feedback is very  important to us as you can help us better understand your patient needs as well as improve your experience and satisfaction. WE CARE ABOUT YOU!!!   Do not forget to get your eyes examined and make sure that the eye doctor sends us a copy of this report.  This is very important that you get this once yearly. Take the prednisone as directed Drink more water Watch blood sugars closely while taking prednisone If the back pain does not get better after the course of prednisone and injection please get back in touch with us and we will arrange for further evaluation. Please consider doing the Cologuard test since she did not want to do the colonoscopy.

## 2018-02-15 NOTE — Progress Notes (Signed)
Subjective:    Patient ID: Seth Martinez, male    DOB: 10/13/1950, 67 y.o.   MRN: [id number]  HPI Pt here for follow up and management of chronic medical problems which includes hypertension and hyperlipidemia. He is taking medication regularly.  This patient is doing well overall other than continuing ongoing low back pain and right hip pain.  He is been helped in the past by taking short courses of prednisone.  He is due to return in FOBT and will get lab work today.  He refuses to do any colonoscopies.  His last one was in November 2003.  He is also past due on getting his diabetic eye exam and he was reminded today to get this.  The patient denies any chest pain or shortness of breath anymore than usual.  He refuses to do a colonoscopy but may consider a Cologuard test and we will give him the information about this when he leaves the office today.  He has no trouble with nausea vomiting diarrhea blood in the stool or black tarry bowel movements.  He has no trouble with burning pain or frequency but does dribble post voiding.  Is far as his back is concerned he has had chronic problems with his back.  He notices that with just walking a certain distance that he will have severe pain in his back and right hip.  He does recall 3 days ago doing some minor lifting and twisting and then the next day his pain was worse in his back radiating to his right groin and down his right hip.  It may be somewhat better today.    Patient Active Problem List   Diagnosis Date Noted  . BPH (benign prostatic hypertrophy) 12/26/2014  . Metabolic syndrome 91/69/4503  . Multiple drug allergies 09/28/2013  . Statin intolerance 09/28/2013  . Nocturia 07/25/2013  . Rash and nonspecific skin eruption 05/15/2013  . Morbid obesity ([place]) 05/04/2013  . Staphylococcus aureus septicemia, history of 05/04/2013  . Septic arthritis of multiple joints, history of 05/02/2013  . Hyperlipemia 12/28/2012  . Hypertension  12/28/2012  . Vitamin D deficiency 12/28/2012   Outpatient Encounter Medications as of 02/15/2018  Medication Sig  . aspirin 325 MG tablet Take 325 mg by mouth daily.  Marland Kitchen azelastine (ASTELIN) 0.1 % nasal spray Place 1 spray into both nostrils 2 (two) times daily. Use in each nostril as directed  . cyclobenzaprine (FLEXERIL) 10 MG tablet Take 1 tablet (10 mg total) by mouth 3 (three) times daily as needed for muscle spasms.  Marland Kitchen EPINEPHrine (EPI-PEN) 0.3 mg/0.3 mL SOAJ injection Inject 0.3 mLs (0.3 mg total) into the muscle once.  Vanessa Kick Ethyl (VASCEPA) 1 g CAPS Take 2 g by mouth 2 (two) times daily.  Marland Kitchen oxyCODONE-acetaminophen (PERCOCET/ROXICET) 5-325 MG tablet Take 1 tablet by mouth every 8 (eight) hours as needed.  . ramipril (ALTACE) 5 MG capsule Take 1 capsule (5 mg total) by mouth daily.  Marland Kitchen oxyCODONE-acetaminophen (PERCOCET/ROXICET) 5-325 MG tablet Take 1 tablet by mouth every 8 (eight) hours as needed for severe pain. (Patient not taking: Reported on 05/12/2017)  . oxyCODONE-acetaminophen (PERCOCET/ROXICET) 5-325 MG tablet Take 1 tablet by mouth every 8 (eight) hours as needed for severe pain. (Patient not taking: Reported on 05/12/2017)   No facility-administered encounter medications on file as of 02/15/2018.      Review of Systems  Constitutional: Negative.   HENT: Negative.   Eyes: Negative.   Respiratory: Negative.   Cardiovascular: Negative.  Gastrointestinal: Negative.   Endocrine: Negative.   Genitourinary: Negative.   Musculoskeletal: Positive for back pain (low back pain - going down right hip ).  Skin: Negative.   Allergic/Immunologic: Negative.   Neurological: Negative.   Hematological: Negative.   Psychiatric/Behavioral: Negative.        Objective:   Physical Exam  Constitutional: He is oriented to person, place, and time. He appears well-developed and well-nourished. No distress.  Is pleasant and relaxed other than this most recent problem with his back  which seems to been exacerbated from some increased lifting and turning 3 days ago.  HENT:  Head: Normocephalic and atraumatic.  Right Ear: External ear normal.  Left Ear: External ear normal.  Nose: Nose normal.  Mouth/Throat: Oropharynx is clear and moist. No oropharyngeal exudate.  Eyes: Pupils are equal, round, and reactive to light. Conjunctivae and EOM are normal. Right eye exhibits no discharge. Left eye exhibits no discharge. No scleral icterus.  Patient was made aware again of his need to get a good eye exam and make sure that we get a copy of the report  Neck: Normal range of motion. Neck supple. No thyromegaly present.  No bruits thyromegaly or anterior cervical adenopathy  Cardiovascular: Normal rate, regular rhythm, normal heart sounds and intact distal pulses.  No murmur heard. Heart is regular at 60/min  Pulmonary/Chest: Effort normal and breath sounds normal. He has no wheezes. He has no rales. He exhibits no tenderness.  Clear anteriorly and posteriorly no axillary adenopathy  Abdominal: Soft. Bowel sounds are normal. He exhibits no mass. There is no tenderness. There is no rebound and no guarding.  Abdomen is obese without masses tenderness organ enlargement or bruits other than some generalized epigastric tenderness.  Which is mild.  Musculoskeletal: He exhibits tenderness. He exhibits no edema.  Patient has limited range of motion and neural paralumbar muscle tenderness with palpation.  Patient has trouble laying down on the bed because of severe back pain.  Lymphadenopathy:    He has no cervical adenopathy.  Neurological: He is alert and oriented to person, place, and time. He has normal reflexes. No cranial nerve deficit.  Lower extremity reflexes are 1+ and equal bilaterally  Skin: Skin is warm and dry. No rash noted.  Psychiatric: He has a normal mood and affect. His behavior is normal. Judgment and thought content normal.  The patient's mood affect and behavior are  normal.  Nursing note and vitals reviewed.  BP 139/79 (BP Location: Left Arm)   Pulse (!) 55   Temp (!) 97.5 F (36.4 C) (Oral)   Ht '5\' 11"'  (1.803 m)   Wt (!) 316 lb (143.3 kg)   BMI 44.07 kg/m         Assessment & Plan:  1. Pure hypercholesterolemia -Continue aggressive therapeutic lifestyle changes pending results of lab work - CBC with Differential/Platelet - Lipid panel  2. Vitamin D deficiency -Continue current treatment pending results of lab work - CBC with Differential/Platelet - VITAMIN D 25 Hydroxy (Vit-D Deficiency, Fractures)  3. Essential hypertension -Blood pressure is good today and he will continue with current treatment - BMP8+EGFR - CBC with Differential/Platelet - Hepatic function panel  4. Type 2 diabetes mellitus without complication, without long-term current use of insulin (HCC) -Tinea aggressive therapeutic lifestyle changes and current treatment pending results of lab work - CBC with Differential/Platelet - Bayer [place] Hb A1c Waived  5. Benign prostatic hyperplasia with lower urinary tract symptoms, symptom details unspecified -No complaints with  voiding other than post voiding dribbling. - CBC with Differential/Platelet  6. Right low back pain, unspecified chronicity, with sciatica presence unspecified -This is especially been worse over the past few days and may be attributed to some lifting and movement activity that he did at home on Saturday. - methylPREDNISolone acetate (DEPO-MEDROL) injection 60 mg  7.  Morbid obesity -The patient understands to continue to do and make every effort to reduce weight through diet and exercise.  Meds ordered this encounter  Medications  . methylPREDNISolone acetate (DEPO-MEDROL) injection 60 mg  . predniSONE (DELTASONE) 10 MG tablet    Sig: Take 1 tab QID x 2 days, then 1 tab TID x 2 days, then 1 tab BID x 2 days, then 1 tab QD x 2 days    Dispense:  20 tablet    Refill:  0   Patient Instructions                        Medicare Annual Wellness Visit  [facility] and the medical providers at [facility] strive to bring you the best medical care.  In doing so we not only want to address your current medical conditions and concerns but also to detect new conditions early and prevent illness, disease and health-related problems.    Medicare offers a yearly Wellness Visit which allows our clinical staff to assess your need for preventative services including immunizations, lifestyle education, counseling to decrease risk of preventable diseases and screening for fall risk and other medical concerns.    This visit is provided free of charge (no copay) for all Medicare recipients. The clinical pharmacists at [facility] have begun to conduct these Wellness Visits which will also include a thorough review of all your medications.    As you primary medical provider recommend that you make an appointment for your Annual Wellness Visit if you have not done so already this year.  You may set up this appointment before you leave today or you may call back ([phone]) and schedule an appointment.  Please make sure when you call that you mention that you are scheduling your Annual Wellness Visit with the clinical pharmacist so that the appointment may be made for the proper length of time.     Continue current medications. Continue good therapeutic lifestyle changes which include good diet and exercise. Fall precautions discussed with patient. If an FOBT was given today- please return it to our front desk. If you are over 39 years old - you may need Prevnar 52 or the adult Pneumonia vaccine.  **Flu shots are available--- please call and schedule a FLU-CLINIC appointment**  After your visit with [place] today you will receive a survey in the mail or online from [employer] regarding your care with [place]. Please take a moment to fill this out. Your feedback is very  important to [place] as you can help [place] better understand your patient needs as well as improve your experience and satisfaction. WE CARE ABOUT YOU!!!   Do not forget to get your eyes examined and make sure that the eye doctor sends [place] a copy of this report.  This is very important that you get this once yearly. Take the prednisone as directed Drink more water Watch blood sugars closely while taking prednisone If the back pain does not get better after the course of prednisone and injection please get back in touch with [place] and we will arrange for  further evaluation. Please consider doing the Cologuard test since she did not want to do the colonoscopy.  Arrie Senate MD

## 2018-02-16 LAB — BMP8+EGFR
BUN / CREAT RATIO: 10 (ref 10–24)
BUN: 12 mg/dL (ref 8–27)
CALCIUM: 9.1 mg/dL (ref 8.6–10.2)
CO2: 20 mmol/L (ref 20–29)
CREATININE: 1.16 mg/dL (ref 0.76–1.27)
Chloride: 104 mmol/L (ref 96–106)
GFR calc Af Amer: 74 mL/min/{1.73_m2} (ref >59)
GFR calc non Af Amer: 64 mL/min/{1.73_m2} (ref >59)
Glucose: 101 mg/dL — ABNORMAL HIGH (ref 65–99)
POTASSIUM: 4.4 mmol/L (ref 3.5–5.2)
SODIUM: 140 mmol/L (ref 134–144)

## 2018-02-16 LAB — CBC WITH DIFFERENTIAL/PLATELET
Basophils Absolute: 0.1 10*3/uL (ref 0.0–0.2)
Basos: 1 %
EOS (ABSOLUTE): 0.3 10*3/uL (ref 0.0–0.4)
EOS: 5 %
HEMATOCRIT: 48.7 % (ref 37.5–51.0)
Hemoglobin: 16 g/dL (ref 13.0–17.7)
Immature Grans (Abs): 0 10*3/uL (ref 0.0–0.1)
Immature Granulocytes: 0 %
LYMPHS ABS: 1.8 10*3/uL (ref 0.7–3.1)
Lymphs: 33 %
MCH: 29.8 pg (ref 26.6–33.0)
MCHC: 32.9 g/dL (ref 31.5–35.7)
MCV: 91 fL (ref 79–97)
MONOS ABS: 0.5 10*3/uL (ref 0.1–0.9)
Monocytes: 10 %
NEUTROS PCT: 51 %
Neutrophils Absolute: 2.7 10*3/uL (ref 1.4–7.0)
PLATELETS: 237 10*3/uL (ref 150–450)
RBC: 5.37 x10E6/uL (ref 4.14–5.80)
RDW: 13.5 % (ref 12.3–15.4)
WBC: 5.3 10*3/uL (ref 3.4–10.8)

## 2018-02-16 LAB — HEPATIC FUNCTION PANEL
ALBUMIN: 4.4 g/dL (ref 3.6–4.8)
ALT: 28 IU/L (ref 0–44)
AST: 30 IU/L (ref 0–40)
Alkaline Phosphatase: 54 IU/L (ref 39–117)
BILIRUBIN TOTAL: 0.4 mg/dL (ref 0.0–1.2)
Bilirubin, Direct: 0.15 mg/dL (ref 0.00–0.40)
TOTAL PROTEIN: 6.7 g/dL (ref 6.0–8.5)

## 2018-02-16 LAB — VITAMIN D 25 HYDROXY (VIT D DEFICIENCY, FRACTURES): VIT D 25 HYDROXY: 44.1 ng/mL (ref 30.0–100.0)

## 2018-02-16 LAB — LIPID PANEL
Chol/HDL Ratio: 3.8 ratio (ref 0.0–5.0)
Cholesterol, Total: 149 mg/dL (ref 100–199)
HDL: 39 mg/dL — ABNORMAL LOW (ref >39)
LDL Calculated: 82 mg/dL (ref 0–99)
Triglycerides: 140 mg/dL (ref 0–149)
VLDL Cholesterol Cal: 28 mg/dL (ref 5–40)

## 2018-03-22 ENCOUNTER — Ambulatory Visit: Payer: Medicare Other | Admitting: *Deleted

## 2018-04-27 ENCOUNTER — Encounter: Payer: Self-pay | Admitting: *Deleted

## 2018-04-27 ENCOUNTER — Ambulatory Visit (INDEPENDENT_AMBULATORY_CARE_PROVIDER_SITE_OTHER): Payer: Medicare Other | Admitting: *Deleted

## 2018-04-27 VITALS — BP 148/57 | HR 56 | Ht 71.0 in | Wt 309.0 lb

## 2018-04-27 DIAGNOSIS — Z0000 Encounter for general adult medical examination without abnormal findings: Secondary | ICD-10-CM | POA: Diagnosis not present

## 2018-04-27 DIAGNOSIS — Z1211 Encounter for screening for malignant neoplasm of colon: Secondary | ICD-10-CM

## 2018-04-27 NOTE — Progress Notes (Signed)
Subjective:   Seth Martinez is a 67 y.o. male who presents for an Initial Medicare Annual Wellness Visit.  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  He enjoys going to church, working around his home, and spending time with his family.  He lives at home with his wife.  They have 2 adult children and 5 grandchildren.  Seth Martinez feels his health is declined from last year because he has not been as active due to back pain and leg weakness.  He reports no hospitalizations, ER visits, or surgeries in the past year.   Review of Systems   Positive for headache All other systems negative         Objective:    Today's Vitals   04/27/18 1022  BP: (!) 148/57  Pulse: (!) 56  Weight: (!) 309 lb (140.2 kg)  Height: '5\' 11"'  (1.803 m)  PainSc: 3   PainLoc: Head   Body mass index is 43.1 kg/m.  Advanced Directives 04/27/2018 06/22/2013 05/16/2013 05/02/2013  Does Patient Have a Medical Advance Directive? No Patient does not have advance directive;Patient would not like information Patient does not have advance directive Patient does not have advance directive  Would patient like information on creating a medical advance directive? Yes (MAU/Ambulatory/Procedural Areas - Information given) - - -  Pre-existing out of facility DNR order (yellow form or pink MOST form) - - No -    Current Medications (verified) Outpatient Encounter Medications as of 04/27/2018  Medication Sig  . aspirin 325 MG tablet Take 325 mg by mouth daily.  Marland Kitchen azelastine (ASTELIN) 0.1 % nasal spray Place 1 spray into both nostrils 2 (two) times daily. Use in each nostril as directed  . cyclobenzaprine (FLEXERIL) 10 MG tablet Take 1 tablet (10 mg total) by mouth 3 (three) times daily as needed for muscle spasms.  Marland Kitchen EPINEPHrine (EPI-PEN) 0.3 mg/0.3 mL SOAJ injection Inject  0.3 mLs (0.3 mg total) into the muscle once.  Marland Kitchen oxyCODONE-acetaminophen (PERCOCET/ROXICET) 5-325 MG tablet Take 1 tablet by mouth every 8 (eight) hours as needed for severe pain.  Marland Kitchen oxyCODONE-acetaminophen (PERCOCET/ROXICET) 5-325 MG tablet Take 1 tablet by mouth every 8 (eight) hours as needed for severe pain.  Marland Kitchen oxyCODONE-acetaminophen (PERCOCET/ROXICET) 5-325 MG tablet Take 1 tablet by mouth every 8 (eight) hours as needed.  . ramipril (ALTACE) 5 MG capsule Take 1 capsule (5 mg total) by mouth daily.  Vanessa Kick Ethyl (VASCEPA) 1 g CAPS Take 2 g by mouth 2 (two) times daily. (Patient not taking: Reported on 04/27/2018)  . predniSONE (DELTASONE) 10 MG tablet Take 1 tab QID x 2 days, then 1 tab TID x 2 days, then 1 tab BID x 2 days, then 1 tab QD x 2 days (Patient not taking: Reported on 04/27/2018)   No facility-administered encounter medications on file as of 04/27/2018.     Allergies (verified) Morphine and related; Penicillins; Vancomycin; Amitriptyline; Antara [fenofibrate]; Bextra [valdecoxib]; Cefdinir; Celebrex [celecoxib]; Crestor [rosuvastatin calcium]; Escitalopram oxalate; Fish oil; Ibuprofen; Naproxen sodium; Neurontin [gabapentin]; Skelaxin; Ultram [tramadol]; Vioxx [rofecoxib]; and Zocor [simvastatin]   History: Past Medical History:  Diagnosis Date  . Chronic back pain   . Depression   . Fatigue   . Hyperlipidemia   . Hypertension   . Insomnia   . Joint pain   . Meniscus tear    bilateral  knees  . Metabolic syndrome   . Obesity   . Septic arthritis ([place]) October/2014  . Vertigo    Past Surgical History:  Procedure Laterality Date  . BACK SURGERY  1992   lumb fusion  . [place]   right-fx  . fix screws in sacrum  in office  1993  . fusion lt sacrum with screws  1993  . HERNIA REPAIR     bilat ing 37 weeks old  . I&D EXTREMITY Right 06/23/2013   Procedure: IRRIGATION AND DEBRIDEMENT EXTREMITY;  Surgeon: Roseanne Kaufman, MD;  Location: [facility];  Service: Orthopedics;  Laterality: Right;  . INGUINAL HERNIA REPAIR     bilat age 50  . KNEE ARTHROSCOPY Left 05/04/2013   Procedure: ARTHROSCOPY KNEE WITH WASHOUT;  Surgeon: Renette Butters, MD;  Location: [facility];  Service: Orthopedics;  Laterality: Left;  . ORIF CLAVICLE FRACTURE  1956   left-age 22  . ORIF FIBULA FRACTURE  1956   lt-accident age 77yr  . ORIF PELVIC FRACTURE  1956   age 89 . ORIF [place]  left-age 22  . rt knee arthroscopic  07/2006  . TEE WITHOUT CARDIOVERSION N/A 05/04/2013   Procedure: TRANSESOPHAGEAL ECHOCARDIOGRAM (TEE);  Surgeon: MCandee Furbish MD;  [facility];  Service: Cardiovascular;  Laterality: N/A;  . TENDON REPAIR Right 06/23/2013   Procedure: RIGHT INDEX FINGER AND PALM IRRIGATION AND DEBRIDEMENT FLEXOR  TENOSYNOVECTOMY BX AND REPAIR AS NECESSARY ;  Surgeon: WRoseanne Kaufman MD;  [facility]  Service: Orthopedics;  Laterality: Right;   Family History  Problem Relation Age of Onset  . Stroke Father   . Diabetes type II Brother    Social History   Socioeconomic History  . Marital status: Married    Spouse name: Not on file  . Number of children: Not on file  . Years of education: Not on file  . Highest education level: Not on file  Occupational History  . Not on file  Social Needs  . Financial resource strain: Not on file  . Food insecurity:    Worry: Not on file    Inability: Not on file  . Transportation needs:    Medical: Not on file    Non-medical: Not on file  Tobacco Use  . Smoking status: Never Smoker  . Smokeless tobacco: Never Used  Substance and Sexual Activity  . Alcohol use: No    Comment: rare  . Drug use: No  . Sexual activity: Not on file  Lifestyle  . Physical activity:    Days per week: Not on file    Minutes per session: Not on file  . Stress: Not on file  Relationships  . Social connections:    Talks on phone: Not on file    Gets together: Not on  file    Attends religious service: Not on file    Active member of club or organization: Not on file    Attends meetings of clubs or organizations: Not on file    Relationship status: Not on file  Other Topics Concern  . Not on file  Social History Narrative   ** Merged History Encounter **       Tobacco Counseling Counseling given: No   Clinical Intake:     Pain Score: 3                  Activities of Daily Living  In your present state of health, do you have any difficulty performing the following activities: 04/27/2018  Hearing? Y  Comment Trouble hearing television at times.  Vision? Y  Comment Need eye exam   Difficulty concentrating or making decisions? Y  Comment Trouble concentrating  Walking or climbing stairs? Y  Comment Back pain and leg weakness   Dressing or bathing? N  Doing errands, shopping? N  Preparing Food and eating ? N  Using the Toilet? N  In the past six months, have you accidently leaked urine? N  Do you have problems with loss of bowel control? N  Managing your Medications? N  Managing your Finances? N  Housekeeping or managing your Housekeeping? N  Comment Son helps if needed  Some recent data might be hidden     Immunizations and Health Maintenance Immunization History  Administered Date(s) Administered  . Tdap 05/11/2011   Health Maintenance Due  Topic Date Due  . COLON CANCER SCREENING ANNUAL FOBT  10/18/2013  . OPHTHALMOLOGY EXAM  12/28/2016    Patient Care Team: Chipper Herb, MD as PCP - General (Family Medicine) Kristeen Miss, MD as Consulting Physician (Neurosurgery)      Assessment:   This is a routine wellness examination for [place].  Hearing/Vision screen No hearing deficit noted.  Patient states he has to turn the television volume up louder than normal to hear it, and his wife tells him he needs to have his hearing checked.  He declines an audiology referral at this time. Mr. Jayson states he knows  he is due for an eye exam, but he has not been because he does not want to pay over $100 for the visit.  [facility] in [place], [place]- they state if patient has not met his deductible the exam is $70.  Patient agreeable to this, and states he will go for eye exam in the next few weeks.     Dietary issues and exercise activities discussed: Current Exercise Habits: The patient does not participate in regular exercise at present, Exercise limited by: orthopedic condition(s)  Goals    . Weight (lb) < 300 lb (136.1 kg)     Reduce intake of fried foods and sweets.       Depression Screen PHQ 2/9 Scores 04/27/2018 02/15/2018 09/24/2017 05/12/2017  PHQ - 2 Score 0 0 0 1  PHQ- 9 Score - - - -    Fall Risk Fall Risk  04/27/2018 02/15/2018 09/24/2017 05/12/2017 12/28/2016  Falls in the past year? No No No No No  Number falls in past yr: - - - - -  Injury with Fall? - - - - -  Risk for fall due to : - - - - -    Is the patient's home free of loose throw rugs in walkways, pet beds, electrical cords, etc?   yes      Grab bars in the bathroom? no      Handrails on the stairs?   yes      Adequate lighting?   yes    Cognitive Function: MMSE - Mini Mental State Exam 04/27/2018 06/10/2015  Orientation to time 5 5  Orientation to Place 5 5  Registration 3 3  Attention/ Calculation 5 5  Recall 2 3  Language- name 2 objects 2 2  Language- repeat 1 1  Language- follow 3 step command 3 3  Language- read & follow direction 1 1  Write a sentence 1 1  Copy  design 1 1  Total score 29 30        Screening Tests Health Maintenance  Topic Date Due  . COLON CANCER SCREENING ANNUAL FOBT  10/18/2013  . OPHTHALMOLOGY EXAM  12/28/2016  . COLONOSCOPY  02/16/2019 (Originally 05/29/2012)  . INFLUENZA VACCINE  06/10/2019 (Originally 02/17/2018)  . PNA vac Low Risk Adult (1 of 2 - PCV13) 12/29/2019 (Originally 05/09/2014)  . Hepatitis C Screening  12/28/2020 (Originally Mar 17, 1951)  . HEMOGLOBIN  A1C  08/18/2018  . FOOT EXAM  02/16/2019  . TETANUS/TDAP  04/19/2021    Declined flu and pneumonia vaccines today.   Qualifies for Shingles Vaccine? declined  Cancer Screenings: Lung: Low Dose CT Chest recommended if Age 35-80 years, 30 pack-year currently smoking OR have quit w/in 15years. Patient does not qualify. Colorectal: patient declines a colonoscopy referral and Cologuard.  He is agreeable to completing FOBT - kit given.  Asked him to complete and bring back to the office before his appointment with Dr. Laurance Flatten in December.   Additional Screenings:  Hepatitis C Screening:  Recommend at next appointment with Dr. Laurance Flatten.       Plan:     Work on your goal of losing 10 lbs by reducing intake of fried foods and sweets. Complete your stool test and return to our office.  Review the information on Advance Directives, and if you complete these please bring a copy to our office to have it filed in your chart. Remember to go for your eye exam.    I have personally reviewed and noted the following in the patient's chart:   . Medical and social history . Use of alcohol, tobacco or illicit drugs  . Current medications and supplements . Functional ability and status . Nutritional status . Physical activity . Advanced directives . List of other physicians . Hospitalizations, surgeries, and ER visits in previous 12 months . Vitals . Screenings to include cognitive, depression, and falls . Referrals and appointments  In addition, I have reviewed and discussed with patient certain preventive protocols, quality metrics, and best practice recommendations. A written personalized care plan for preventive services as well as general preventive health recommendations were provided to patient.     Kynzli Rease M, RN   04/27/2018   I have reviewed and agree with the above AWV documentation.   Arrie Senate MD

## 2018-04-27 NOTE — Patient Instructions (Addendum)
Please work on your goal of losing 10 lbs by reducing intake of fried foods and sweets.  Please complete your stool test and return to our office.   Please review the information on Advance Directives, and if you complete these please bring a copy to our office to have it filed in your chart.  Remember to go for your eye exam.  Thank you for coming in for your Annual Wellness Visit today!!  Preventive Care 65 Years and Older, Male Preventive care refers to lifestyle choices and visits with your health care provider that can promote health and wellness. What does preventive care include?  A yearly physical exam. This is also called an annual well check.  Dental exams once or twice a year.  Routine eye exams. Ask your health care provider how often you should have your eyes checked.  Personal lifestyle choices, including: ? Daily care of your teeth and gums. ? Regular physical activity. ? Eating a healthy diet. ? Avoiding tobacco and drug use. ? Limiting alcohol use. ? Practicing safe sex. ? Taking low doses of aspirin every day. ? Taking vitamin and mineral supplements as recommended by your health care provider. What happens during an annual well check? The services and screenings done by your health care provider during your annual well check will depend on your age, overall health, lifestyle risk factors, and family history of disease. Counseling Your health care provider may ask you questions about your:  Alcohol use.  Tobacco use.  Drug use.  Emotional well-being.  Home and relationship well-being.  Sexual activity.  Eating habits.  History of falls.  Memory and ability to understand (cognition).  Work and work Statistician.  Screening You may have the following tests or measurements:  Height, weight, and BMI.  Blood pressure.  Lipid and cholesterol levels. These may be checked every 5 years, or more frequently if you are over 64 years old.  Skin  check.  Lung cancer screening. You may have this screening every year starting at age 68 if you have a 30-pack-year history of smoking and currently smoke or have quit within the past 15 years.  Fecal occult blood test (FOBT) of the stool. You may have this test every year starting at age 78.  Flexible sigmoidoscopy or colonoscopy. You may have a sigmoidoscopy every 5 years or a colonoscopy every 10 years starting at age 38.  Prostate cancer screening. Recommendations will vary depending on your family history and other risks.  Hepatitis C blood test.  Hepatitis B blood test.  Sexually transmitted disease (STD) testing.  Diabetes screening. This is done by checking your blood sugar (glucose) after you have not eaten for a while (fasting). You may have this done every 1-3 years.  Abdominal aortic aneurysm (AAA) screening. You may need this if you are a current or former smoker.  Osteoporosis. You may be screened starting at age 79 if you are at high risk.  Talk with your health care provider about your test results, treatment options, and if necessary, the need for more tests. Vaccines Your health care provider may recommend certain vaccines, such as:  Influenza vaccine. This is recommended every year.  Tetanus, diphtheria, and acellular pertussis (Tdap, Td) vaccine. You may need a Td booster every 10 years.  Varicella vaccine. You may need this if you have not been vaccinated.  Zoster vaccine. You may need this after age 50.  Measles, mumps, and rubella (MMR) vaccine. You may need at least one dose  of MMR if you were born in 1957 or later. You may also need a second dose.  Pneumococcal 13-valent conjugate (PCV13) vaccine. One dose is recommended after age 42.  Pneumococcal polysaccharide (PPSV23) vaccine. One dose is recommended after age 55.  Meningococcal vaccine. You may need this if you have certain conditions.  Hepatitis A vaccine. You may need this if you have certain  conditions or if you travel or work in places where you may be exposed to hepatitis A.  Hepatitis B vaccine. You may need this if you have certain conditions or if you travel or work in places where you may be exposed to hepatitis B.  Haemophilus influenzae type b (Hib) vaccine. You may need this if you have certain risk factors.  Talk to your health care provider about which screenings and vaccines you need and how often you need them. This information is not intended to replace advice given to you by your health care provider. Make sure you discuss any questions you have with your health care provider. Document Released: 08/02/2015 Document Revised: 03/25/2016 Document Reviewed: 05/07/2015 Elsevier Interactive Patient Education  [employer].

## 2018-04-28 ENCOUNTER — Other Ambulatory Visit: Payer: Medicare Other

## 2018-04-28 DIAGNOSIS — Z1211 Encounter for screening for malignant neoplasm of colon: Secondary | ICD-10-CM

## 2018-04-29 ENCOUNTER — Ambulatory Visit: Payer: Medicare Other | Admitting: *Deleted

## 2018-05-03 LAB — FECAL OCCULT BLOOD, IMMUNOCHEMICAL: Fecal Occult Bld: NEGATIVE

## 2018-05-09 ENCOUNTER — Ambulatory Visit: Payer: Self-pay | Admitting: Family Medicine

## 2018-06-21 NOTE — Progress Notes (Signed)
Subjective:    Patient ID: Seth Martinez, male    DOB: 1951/07/06, 67 y.o.   MRN: [id number]  HPI  Pt is here today for a 4 month follow up of chronic medical conditoins which include hyperlipidemia, metabolic syndrome, hypertension and enlarged prostate.  The patient comes in today for a complete physical exam.  The patient has a history of hyperlipidemia hypertension morbid obesity and BPH.  He has ongoing chronic and severe back pain.  It is also noteworthy that he has multiple drug allergies.  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  The family history is positive in that his dad had a stroke.  His mother is still living.  The patient takes a 325 mg aspirin Astelin nose spray Altase 5 mg and vascepa.  He is intolerant of all statins.  Patient today is doing well overall other than his ongoing chronic back pain.  He also complains of numbness in his left thumb and first 2 fingers which is in the C7 and VIII nerve root pattern especially when he is driving a car.  Otherwise it does not bother him.  He also just complains of fatigue and lethargy in general.  This seems to be worse.  He does not check his blood pressures at home.  I offered him the opportunity to do a Cologuard and he refuses to do that as he has never had a colonoscopy.  He did see the eye doctor 2 to 3 months ago and was told he has small cataracts.  He denies any chest pain.  He does have occasional shortness of breath off and on but no worse than usual.  He does describe some burning in his throat that is relieved by taking Tums.  He has no trouble swallowing no blood in the stool black tarry bowel movements abdominal pain or change in bowel habits.  He passes his water well but does have some dribbling post voiding.  The EKG has already been done and was within normal limits.  The FOBT card was negative on 05/04/2018.    Patient Active Problem  List   Diagnosis Date Noted  . BPH (benign prostatic hypertrophy) 12/26/2014  . Metabolic syndrome 20/60/1561  . Multiple drug allergies 09/28/2013  . Statin intolerance 09/28/2013  . Nocturia 07/25/2013  . Rash and nonspecific skin eruption 05/15/2013  . Morbid obesity ([place]) 05/04/2013  . Staphylococcus aureus septicemia, history of 05/04/2013  . Septic arthritis of multiple joints, history of 05/02/2013  . Hyperlipemia 12/28/2012  . Hypertension 12/28/2012  . Vitamin D deficiency 12/28/2012   Outpatient Encounter Medications as of 06/22/2018  Medication Sig  . aspirin 325 MG tablet Take 325 mg by mouth daily.  Marland Kitchen azelastine (ASTELIN) 0.1 % nasal spray Place 1 spray into both nostrils 2 (two) times daily. Use in each nostril as directed  . cyclobenzaprine (FLEXERIL) 10 MG tablet Take 1 tablet (10 mg total) by mouth 3 (three) times daily as needed for muscle spasms.  Marland Kitchen EPINEPHrine (EPI-PEN) 0.3 mg/0.3 mL SOAJ injection Inject 0.3 mLs (0.3 mg total) into the muscle once.  Vanessa Kick Ethyl (VASCEPA) 1 g CAPS Take 2 g by mouth 2 (two) times daily. (Patient not taking: Reported on 04/27/2018)  . oxyCODONE-acetaminophen (PERCOCET/ROXICET) 5-325 MG tablet Take 1 tablet by mouth every 8 (eight) hours as needed for severe pain.  Marland Kitchen oxyCODONE-acetaminophen (PERCOCET/ROXICET) 5-325 MG tablet Take 1 tablet  by mouth every 8 (eight) hours as needed for severe pain.  Marland Kitchen oxyCODONE-acetaminophen (PERCOCET/ROXICET) 5-325 MG tablet Take 1 tablet by mouth every 8 (eight) hours as needed.  . ramipril (ALTACE) 5 MG capsule Take 1 capsule (5 mg total) by mouth daily.  . [DISCONTINUED] predniSONE (DELTASONE) 10 MG tablet Take 1 tab QID x 2 days, then 1 tab TID x 2 days, then 1 tab BID x 2 days, then 1 tab QD x 2 days (Patient not taking: Reported on 04/27/2018)   No facility-administered encounter medications on file as of 06/22/2018.         Review of Systems  Constitutional: Positive for fatigue.  HENT:  Positive for congestion, postnasal drip (worse in AM) and sinus pressure.   Gastrointestinal: Negative for abdominal pain and constipation.  Neurological: Positive for headaches (in AM when awakening). Negative for numbness (bilateral hands L>R when raising arm).       Objective:   Physical Exam  Constitutional: He is oriented to person, place, and time. He appears well-developed and well-nourished. No distress.  The patient is pleasant and alert and doing well overall.  HENT:  Head: Normocephalic and atraumatic.  Right Ear: External ear normal.  Left Ear: External ear normal.  Nose: Nose normal.  Mouth/Throat: Oropharynx is clear and moist. No oropharyngeal exudate.  Nasal turbinate congestion bilaterally  Eyes: Pupils are equal, round, and reactive to light. Conjunctivae and EOM are normal. Right eye exhibits no discharge. Left eye exhibits no discharge. No scleral icterus.  Patient saw ophthalmologist recently and does have small cataracts and has plans to follow-up.  Neck: Normal range of motion. Neck supple. No thyromegaly present.  No thyromegaly anterior cervical adenopathy or bruit  Cardiovascular: Normal rate, regular rhythm, normal heart sounds and intact distal pulses.  No murmur heard. The heart is regular at 60/min no edema with good pedal pulses  Pulmonary/Chest: Effort normal and breath sounds normal. He has no wheezes. He has no rales. He exhibits no tenderness.  Clear anteriorly and posteriorly  Abdominal: Soft. Bowel sounds are normal. He exhibits no mass. There is no tenderness. There is no guarding.  Abdominal obesity without liver or spleen enlargement epigastric tenderness bruits masses or inguinal adenopathy  Genitourinary: Rectum normal and penis normal.  Genitourinary Comments: The prostate gland is soft and smooth without lumps or masses.  There are no rectal masses.  The external genitalia were within normal limits with no testicular masses.  No inguinal  hernias palpable.  Musculoskeletal: He exhibits tenderness and deformity. He exhibits no edema.  Range of motion especially with walking is not normal due to his ongoing back pain.  Lymphadenopathy:    He has no cervical adenopathy.  Neurological: He is alert and oriented to person, place, and time. He has normal reflexes. No cranial nerve deficit.  Skin: Skin is warm and dry. No rash noted.  Patient has venous pull right cheek that has been there for years.  He has seborrheic keratoses on his scalp.  Psychiatric: He has a normal mood and affect. His behavior is normal. Judgment and thought content normal.  Patient's mood affect and behavior are normal for him.  He has a laid-back approach to his health care.  Nursing note and vitals reviewed.  BP (!) 142/78   Pulse 63   Temp (!) 97.5 F (36.4 C) (Oral)   Ht '5\' 11"'  (1.803 m)   Wt (!) 310 lb 9.6 oz (140.9 kg)   BMI 43.32 kg/m  Assessment & Plan:  1. Pure hypercholesterolemia -Patient is statin intolerant as multiple statins have been tried for this patient.  He should continue with aggressive therapeutic lifestyle changes including diet and exercise to achieve weight loss and help cholesterol. - Lipid panel; Future - Hepatic function panel; Future - Hepatic function panel - Lipid panel  2. Essential hypertension -The blood pressure is slightly elevated on the systolic side on 2 readings today.  Patient will make all efforts to lose weight watch sodium intake check readings at home and bring these readings by for review in 4 to 6 weeks. - BMP8+EGFR; Future - CBC with Differential/Platelet; Future - EKG 12-Lead; Future - EKG 12-Lead - CBC with Differential/Platelet - BMP8+EGFR  3. Benign prostatic hyperplasia with lower urinary tract symptoms, symptom details unspecified -Patient does have dribbling post voiding but no other symptoms with his BPH. - Urinalysis, Routine w reflex microscopic; Future - PSA, total and  free  4. Metabolic syndrome -He actually has morbid obesity and months do better with diet and exercise and we have been stressing this for many years. - BMP8+EGFR; Future - Bayer DCA Hb A1c Waived; Future - Bayer DCA Hb A1c Waived - BMP8+EGFR  5. Morbid obesity due to excess calories ([place]) -The above note  6. Chronic low back pain with bilateral sciatica, unspecified back pain laterality -Stay active physically with walking and exercise and all efforts to lose weight  7. Statin intolerance -Continue aggressive therapeutic lifestyle changes  8. Vitamin D deficiency -Continue vitamin D replacement pending results of lab work  9. Benign prostatic hyperplasia, unspecified whether lower urinary tract symptoms present -Patient does have dribbling post voiding.  No orders of the defined types were placed in this encounter.  Patient Instructions                       Medicare Annual Wellness Visit  [facility] and the medical providers at [facility] strive to bring you the best medical care.  In doing so we not only want to address your current medical conditions and concerns but also to detect new conditions early and prevent illness, disease and health-related problems.    Medicare offers a yearly Wellness Visit which allows our clinical staff to assess your need for preventative services including immunizations, lifestyle education, counseling to decrease risk of preventable diseases and screening for fall risk and other medical concerns.    This visit is provided free of charge (no copay) for all Medicare recipients. The clinical pharmacists at [facility] have begun to conduct these Wellness Visits which will also include a thorough review of all your medications.    As you primary medical provider recommend that you make an appointment for your Annual Wellness Visit if you have not done so already this year.  You may set up this  appointment before you leave today or you may call back ([phone]) and schedule an appointment.  Please make sure when you call that you mention that you are scheduling your Annual Wellness Visit with the clinical pharmacist so that the appointment may be made for the proper length of time.   Continue current medications. Continue good therapeutic lifestyle changes which include good diet and exercise. Fall precautions discussed with patient. If an FOBT was given today- please return it to our front desk. If you are over 28 years old - you may need Prevnar 44 or the adult Pneumonia vaccine.  **Flu shots are  available--- please call and schedule a FLU-CLINIC appointment**  After your visit with [place] today you will receive a survey in the mail or online from [employer] regarding your care with [place]. Please take a moment to fill this out. Your feedback is very important to [place] as you can help [place] better understand your patient needs as well as improve your experience and satisfaction. WE CARE ABOUT YOU!!!   The patient should continue to make all efforts to lose weight through diet and exercise He should watch his sodium intake and check blood pressures more frequently at home and bring these readings by for review in 4 weeks Any little weight loss can help the blood pressure. Take Pepcid AC as needed for burning in the throat before meals twice daily Please reconsider doing the Cologuard test since she refused to do a colonoscopy Follow-up with ophthalmologist as planned If numbness in left hand gets worse we will need to do C-spine films to further evaluate this. Use nasal saline Keep the house as cool as possible Avoid the use of overhead fans as this dries you out more and make sure allergies worse Check blood pressure readings and bring readings by for review in 4 to 6 weeks  Arrie Senate MD

## 2018-06-21 NOTE — Patient Instructions (Addendum)
Medicare Annual Wellness Visit  [facility] and the medical providers at [facility] strive to bring you the best medical care.  In doing so we not only want to address your current medical conditions and concerns but also to detect new conditions early and prevent illness, disease and health-related problems.    Medicare offers a yearly Wellness Visit which allows our clinical staff to assess your need for preventative services including immunizations, lifestyle education, counseling to decrease risk of preventable diseases and screening for fall risk and other medical concerns.    This visit is provided free of charge (no copay) for all Medicare recipients. The clinical pharmacists at [facility] have begun to conduct these Wellness Visits which will also include a thorough review of all your medications.    As you primary medical provider recommend that you make an appointment for your Annual Wellness Visit if you have not done so already this year.  You may set up this appointment before you leave today or you may call back [phone] and schedule an appointment.  Please make sure when you call that you mention that you are scheduling your Annual Wellness Visit with the clinical pharmacist so that the appointment may be made for the proper length of time.   Continue current medications. Continue good therapeutic lifestyle changes which include good diet and exercise. Fall precautions discussed with patient. If an FOBT was given today- please return it to our front desk. If you are over 69 years old - you may need Prevnar 13 or the adult Pneumonia vaccine.  **Flu shots are available--- please call and schedule a FLU-CLINIC appointment**  After your visit with us today you will receive a survey in the mail or online from [employer] regarding your care with us. Please take a moment to fill this out. Your feedback is very important  to us as you can help us better understand your patient needs as well as improve your experience and satisfaction. WE CARE ABOUT YOU!!!   The patient should continue to make all efforts to lose weight through diet and exercise He should watch his sodium intake and check blood pressures more frequently at home and bring these readings by for review in 4 weeks Any little weight loss can help the blood pressure. Take Pepcid AC as needed for burning in the throat before meals twice daily Please reconsider doing the Cologuard test since she refused to do a colonoscopy Follow-up with ophthalmologist as planned If numbness in left hand gets worse we will need to do C-spine films to further evaluate this. Use nasal saline Keep the house as cool as possible Avoid the use of overhead fans as this dries you out more and make sure allergies worse Check blood pressure readings and bring readings by for review in 4 to 6 weeks

## 2018-06-22 ENCOUNTER — Ambulatory Visit (INDEPENDENT_AMBULATORY_CARE_PROVIDER_SITE_OTHER): Payer: Medicare Other | Admitting: Family Medicine

## 2018-06-22 ENCOUNTER — Encounter: Payer: Self-pay | Admitting: Family Medicine

## 2018-06-22 VITALS — BP 142/78 | HR 63 | Temp 97.5°F | Ht 71.0 in | Wt 310.6 lb

## 2018-06-22 DIAGNOSIS — Z789 Other specified health status: Secondary | ICD-10-CM

## 2018-06-22 DIAGNOSIS — N401 Enlarged prostate with lower urinary tract symptoms: Secondary | ICD-10-CM | POA: Diagnosis not present

## 2018-06-22 DIAGNOSIS — I10 Essential (primary) hypertension: Secondary | ICD-10-CM

## 2018-06-22 DIAGNOSIS — E559 Vitamin D deficiency, unspecified: Secondary | ICD-10-CM

## 2018-06-22 DIAGNOSIS — M5441 Lumbago with sciatica, right side: Secondary | ICD-10-CM | POA: Diagnosis not present

## 2018-06-22 DIAGNOSIS — N400 Enlarged prostate without lower urinary tract symptoms: Secondary | ICD-10-CM | POA: Diagnosis not present

## 2018-06-22 DIAGNOSIS — E8881 Metabolic syndrome: Secondary | ICD-10-CM

## 2018-06-22 DIAGNOSIS — G8929 Other chronic pain: Secondary | ICD-10-CM | POA: Diagnosis not present

## 2018-06-22 DIAGNOSIS — M5442 Lumbago with sciatica, left side: Secondary | ICD-10-CM

## 2018-06-22 DIAGNOSIS — R7989 Other specified abnormal findings of blood chemistry: Secondary | ICD-10-CM | POA: Diagnosis not present

## 2018-06-22 DIAGNOSIS — E7800 Pure hypercholesterolemia, unspecified: Secondary | ICD-10-CM

## 2018-06-22 LAB — URINALYSIS, ROUTINE W REFLEX MICROSCOPIC
Bilirubin, UA: NEGATIVE
Glucose, UA: NEGATIVE
Ketones, UA: NEGATIVE
Leukocytes, UA: NEGATIVE
Nitrite, UA: NEGATIVE
PH UA: 5.5 (ref 5.0–7.5)
Specific Gravity, UA: 1.02 (ref 1.005–1.030)
Urobilinogen, Ur: 0.2 mg/dL (ref 0.2–1.0)

## 2018-06-22 LAB — BAYER DCA HB A1C WAIVED: HB A1C (BAYER DCA - WAIVED): 5.5 % (ref <7.0)

## 2018-06-22 LAB — MICROSCOPIC EXAMINATION
Bacteria, UA: NONE SEEN
Epithelial Cells (non renal): NONE SEEN /hpf (ref 0–10)
RENAL EPITHEL UA: NONE SEEN /HPF
WBC, UA: NONE SEEN /hpf (ref 0–5)

## 2018-06-22 NOTE — Addendum Note (Signed)
Addended by: Orma RenderHODGES, Ashkan Chamberland F on: 06/22/2018 11:37 AM   Modules accepted: Orders

## 2018-06-23 LAB — CBC WITH DIFFERENTIAL/PLATELET
Basophils Absolute: 0.1 10*3/uL (ref 0.0–0.2)
Basos: 1 %
EOS (ABSOLUTE): 0.3 10*3/uL (ref 0.0–0.4)
Eos: 4 %
HEMOGLOBIN: 16.2 g/dL (ref 13.0–17.7)
Hematocrit: 47.9 % (ref 37.5–51.0)
Immature Grans (Abs): 0 10*3/uL (ref 0.0–0.1)
Immature Granulocytes: 0 %
Lymphocytes Absolute: 2.1 10*3/uL (ref 0.7–3.1)
Lymphs: 31 %
MCH: 30.2 pg (ref 26.6–33.0)
MCHC: 33.8 g/dL (ref 31.5–35.7)
MCV: 89 fL (ref 79–97)
MONOCYTES: 10 %
Monocytes Absolute: 0.6 10*3/uL (ref 0.1–0.9)
Neutrophils Absolute: 3.5 10*3/uL (ref 1.4–7.0)
Neutrophils: 54 %
Platelets: 239 10*3/uL (ref 150–450)
RBC: 5.36 x10E6/uL (ref 4.14–5.80)
RDW: 13.1 % (ref 12.3–15.4)
WBC: 6.6 10*3/uL (ref 3.4–10.8)

## 2018-06-23 LAB — HEPATIC FUNCTION PANEL
ALT: 28 IU/L (ref 0–44)
AST: 32 IU/L (ref 0–40)
Albumin: 5 g/dL — ABNORMAL HIGH (ref 3.6–4.8)
Alkaline Phosphatase: 66 IU/L (ref 39–117)
Bilirubin Total: 1.2 mg/dL (ref 0.0–1.2)
Bilirubin, Direct: 0.28 mg/dL (ref 0.00–0.40)
TOTAL PROTEIN: 7 g/dL (ref 6.0–8.5)

## 2018-06-23 LAB — LIPID PANEL
Chol/HDL Ratio: 4.5 ratio (ref 0.0–5.0)
Cholesterol, Total: 175 mg/dL (ref 100–199)
HDL: 39 mg/dL — ABNORMAL LOW (ref >39)
LDL Calculated: 92 mg/dL (ref 0–99)
Triglycerides: 219 mg/dL — ABNORMAL HIGH (ref 0–149)
VLDL CHOLESTEROL CAL: 44 mg/dL — AB (ref 5–40)

## 2018-06-23 LAB — PSA, TOTAL AND FREE
PSA, Free Pct: 44.3 %
PSA, Free: 0.31 ng/mL
Prostate Specific Ag, Serum: 0.7 ng/mL (ref 0.0–4.0)

## 2018-06-23 LAB — BMP8+EGFR
BUN / CREAT RATIO: 9 — AB (ref 10–24)
BUN: 11 mg/dL (ref 8–27)
CO2: 20 mmol/L (ref 20–29)
Calcium: 9.7 mg/dL (ref 8.6–10.2)
Chloride: 103 mmol/L (ref 96–106)
Creatinine, Ser: 1.26 mg/dL (ref 0.76–1.27)
GFR calc Af Amer: 67 mL/min/{1.73_m2} (ref >59)
GFR calc non Af Amer: 58 mL/min/{1.73_m2} — ABNORMAL LOW (ref >59)
Glucose: 100 mg/dL — ABNORMAL HIGH (ref 65–99)
Potassium: 4.1 mmol/L (ref 3.5–5.2)
Sodium: 142 mmol/L (ref 134–144)

## 2018-07-23 ENCOUNTER — Encounter: Payer: Self-pay | Admitting: Family Medicine

## 2018-07-23 ENCOUNTER — Ambulatory Visit (INDEPENDENT_AMBULATORY_CARE_PROVIDER_SITE_OTHER): Payer: Medicare Other | Admitting: Family Medicine

## 2018-07-23 VITALS — BP 152/87 | HR 73 | Temp 100.6°F | Ht 71.0 in | Wt 315.8 lb

## 2018-07-23 DIAGNOSIS — R509 Fever, unspecified: Secondary | ICD-10-CM | POA: Diagnosis not present

## 2018-07-23 DIAGNOSIS — J101 Influenza due to other identified influenza virus with other respiratory manifestations: Secondary | ICD-10-CM | POA: Diagnosis not present

## 2018-07-23 MED ORDER — OSELTAMIVIR PHOSPHATE 75 MG PO CAPS: 75.0000 mg | ORAL_CAPSULE | Freq: Two times a day (BID) | ORAL | 0 refills | Status: DC

## 2018-07-23 NOTE — Progress Notes (Signed)
BP (!) 152/87   Pulse 73   Temp (!) 100.6 F (38.1 C) (Oral)   Ht 5\' 11"  (1.803 m)   Wt (!) 315 lb 12.8 oz (143.2 kg)   SpO2 91%   BMI 44.05 kg/m    Subjective:    Patient ID: Seth Martinez, male    DOB: September 21, 1950, 68 y.o.   MRN: [id number]  HPI: Seth Martinez is a 68 y.o. male presenting on 07/23/2018 for Cough; chest congestion; Wheezing; and Headache   HPI Cough and chest congestion and wheezing and headache Patient is coming in complaining of cough and congestion and chest congestion and fevers and headache and wheezing that is been going on for the past 2 days.  He says last night was just really bad and his sweats were worse and he was having trouble coughing all night last night and that is what ultimately brought him in today.  He denies any shortness of breath but just has the wheezing and the sinus pressure and chest congestion and headache.  His cough is been very productive.  Relevant past medical, surgical, family and social history reviewed and updated as indicated. Interim medical history since our last visit reviewed. Allergies and medications reviewed and updated.  Review of Systems  Constitutional: Positive for fever. Negative for chills.  HENT: Positive for congestion, postnasal drip, rhinorrhea, sinus pressure, sneezing and sore throat. Negative for ear discharge, ear pain and voice change.   Eyes: Negative for pain, discharge, redness and visual disturbance.  Respiratory: Positive for cough and wheezing. Negative for shortness of breath.   Cardiovascular: Negative for chest pain and leg swelling.  Musculoskeletal: Negative for gait problem.  Skin: Negative for rash.  Neurological: Positive for headaches.  All other systems reviewed and are negative.   Per HPI unless specifically indicated above   Allergies as of 07/23/2018      Reactions   Morphine And Related Anaphylaxis   Penicillins Anaphylaxis   Has patient had a PCN reaction causing  immediate rash, facial/tongue/throat swelling, SOB or lightheadedness with hypotension: Yes Has patient had a PCN reaction causing severe rash involving mucus membranes or skin necrosis: No Has patient had a PCN reaction that required hospitalization No Has patient had a PCN reaction occurring within the last 10 years: No If all of the above answers are "NO", then may proceed with Cephalosporin use.   Vancomycin Rash, Other (See Comments)   Vasculitis, Red Man's Syndrome   Amitriptyline Other (See Comments)   Unknown   Antara [fenofibrate] Other (See Comments)   Unknown   Bextra [valdecoxib] Other (See Comments)   Unknown   Cefdinir Other (See Comments)   Unknown   Celebrex [celecoxib] Other (See Comments)   Unknown   Crestor [rosuvastatin Calcium] Other (See Comments)   Unknown   Escitalopram Oxalate Other (See Comments)   Unknown   Fish Oil Other (See Comments)   Unknown   Ibuprofen Swelling   Naproxen Sodium Other (See Comments)   Unknown   Neurontin [gabapentin] Other (See Comments)   Unknown   Skelaxin Other (See Comments)   Unknown   Ultram [tramadol] Other (See Comments)   Unknown   Vioxx [rofecoxib] Other (See Comments)   Unknown   Zocor [simvastatin] Other (See Comments)   Unknown      Medication List       Accurate as of July 23, 2018 11:22 AM. Always use your most recent med list.  aspirin 325 MG tablet Take 325 mg by mouth daily.   azelastine 0.1 % nasal spray Commonly known as:  ASTELIN Place 1 spray into both nostrils 2 (two) times daily. Use in each nostril as directed   cyclobenzaprine 10 MG tablet Commonly known as:  FLEXERIL Take 1 tablet (10 mg total) by mouth 3 (three) times daily as needed for muscle spasms.   EPINEPHrine 0.3 mg/0.3 mL Soaj injection Commonly known as:  EPI-PEN Inject 0.3 mLs (0.3 mg total) into the muscle once.   Icosapent Ethyl 1 g Caps Commonly known as:  VASCEPA Take 2 g by mouth 2 (two) times daily.     oseltamivir 75 MG capsule Commonly known as:  TAMIFLU Take 1 capsule (75 mg total) by mouth 2 (two) times daily.   oxyCODONE-acetaminophen 5-325 MG tablet Commonly known as:  PERCOCET/ROXICET Take 1 tablet by mouth every 8 (eight) hours as needed for severe pain.   oxyCODONE-acetaminophen 5-325 MG tablet Commonly known as:  PERCOCET/ROXICET Take 1 tablet by mouth every 8 (eight) hours as needed for severe pain.   oxyCODONE-acetaminophen 5-325 MG tablet Commonly known as:  PERCOCET/ROXICET Take 1 tablet by mouth every 8 (eight) hours as needed.   ramipril 5 MG capsule Commonly known as:  ALTACE Take 1 capsule (5 mg total) by mouth daily.          Objective:    BP (!) 152/87   Pulse 73   Temp (!) 100.6 F (38.1 C) (Oral)   Ht 5\' 11"  (1.803 m)   Wt (!) 315 lb 12.8 oz (143.2 kg)   SpO2 91%   BMI 44.05 kg/m   Wt Readings from Last 3 Encounters:  07/23/18 (!) 315 lb 12.8 oz (143.2 kg)  06/22/18 (!) 310 lb 9.6 oz (140.9 kg)  04/27/18 (!) 309 lb (140.2 kg)    Physical Exam Vitals signs and nursing note reviewed.  Constitutional:      General: He is not in acute distress.    Appearance: He is well-developed. He is not diaphoretic.  HENT:     Right Ear: Tympanic membrane, ear canal and external ear normal.     Left Ear: Tympanic membrane, ear canal and external ear normal.     Nose: Mucosal edema and rhinorrhea present.     Right Sinus: Maxillary sinus tenderness present. No frontal sinus tenderness.     Left Sinus: Maxillary sinus tenderness present. No frontal sinus tenderness.     Mouth/Throat:     Pharynx: Uvula midline. Posterior oropharyngeal erythema present. No oropharyngeal exudate.     Tonsils: No tonsillar abscesses.  Eyes:     General: No scleral icterus.       Right eye: No discharge.     Conjunctiva/sclera: Conjunctivae normal.  Neck:     Musculoskeletal: Neck supple.     Thyroid: No thyromegaly.  Cardiovascular:     Rate and Rhythm: Normal rate and  regular rhythm.     Heart sounds: Normal heart sounds. No murmur.  Pulmonary:     Effort: Pulmonary effort is normal. No respiratory distress.     Breath sounds: Normal breath sounds. No wheezing or rales.  Musculoskeletal: Normal range of motion.  Lymphadenopathy:     Cervical: No cervical adenopathy.  Skin:    General: Skin is warm and dry.     Findings: No rash.  Neurological:     Mental Status: He is alert and oriented to person, place, and time.     Coordination: Coordination  normal.  Psychiatric:        Behavior: Behavior normal.    Influenza A positive     Assessment & Plan:   Problem List Items Addressed This Visit    None    Visit Diagnoses    Influenza A    -  Primary   Relevant Medications   oseltamivir (TAMIFLU) 75 MG capsule   Other Relevant Orders   Rapid Strep Screen ([facility] ONLY)   Veritor Flu A/B Waived       Follow up plan: Return if symptoms worsen or fail to improve.  Counseling provided for all of the vaccine components Orders Placed This Encounter  Procedures  . Rapid Strep Screen ([facility] ONLY)  . Veritor Flu A/B Waived    Arville CareJoshua Dettinger, MD [facility] 07/23/2018, 11:22 AM

## 2018-07-25 LAB — VERITOR FLU A/B WAIVED
INFLUENZA B: NEGATIVE
Influenza A: POSITIVE — AB

## 2018-07-25 LAB — RAPID STREP SCREEN (MED CTR MEBANE ONLY): Strep Gp A Ag, IA W/Reflex: NEGATIVE

## 2018-07-25 LAB — CULTURE, GROUP A STREP

## 2018-08-01 ENCOUNTER — Encounter: Payer: Self-pay | Admitting: Nurse Practitioner

## 2018-08-01 ENCOUNTER — Ambulatory Visit (INDEPENDENT_AMBULATORY_CARE_PROVIDER_SITE_OTHER): Payer: Medicare Other | Admitting: Nurse Practitioner

## 2018-08-01 VITALS — BP 164/90 | HR 71 | Temp 97.8°F | Ht 71.0 in | Wt 312.6 lb

## 2018-08-01 DIAGNOSIS — J069 Acute upper respiratory infection, unspecified: Secondary | ICD-10-CM | POA: Diagnosis not present

## 2018-08-01 MED ORDER — PREDNISONE 20 MG PO TABS: ORAL_TABLET | ORAL | 0 refills | Status: DC

## 2018-08-01 MED ORDER — BENZONATATE 100 MG PO CAPS: 100.0000 mg | ORAL_CAPSULE | Freq: Three times a day (TID) | ORAL | 0 refills | Status: DC | PRN

## 2018-08-01 MED ORDER — AZITHROMYCIN 250 MG PO TABS: ORAL_TABLET | ORAL | 0 refills | Status: DC

## 2018-08-01 NOTE — Patient Instructions (Signed)

## 2018-08-01 NOTE — Progress Notes (Signed)
Subjective:    Patient ID: Seth Martinez, male    DOB: 02-Nov-1950, 68 y.o.   MRN: [id number]   Chief Complaint: Cough (Coughing up blood. . Recent flu 1/4)   HPI Patient comes in today c/o of linger cough. He was diagnosed with flu 07/23/18 and took tamiflu. Feels better but cough will not go away. Cough is worse in the mornings. Has some blood tinged sputum   Review of Systems  Constitutional: Negative for chills and fever.  HENT: Positive for congestion.   Respiratory: Positive for cough (productive). Negative for shortness of breath.   Cardiovascular: Negative.   Gastrointestinal: Negative.   Neurological: Negative.   Psychiatric/Behavioral: Negative.   All other systems reviewed and are negative.      Objective:   Physical Exam Constitutional:      General: He is not in acute distress.    Appearance: He is normal weight.  HENT:     Right Ear: Hearing, tympanic membrane, ear canal and external ear normal.     Left Ear: Hearing, tympanic membrane, ear canal and external ear normal.     Nose: Nose normal.     Mouth/Throat:     Mouth: Mucous membranes are moist.     Pharynx: No posterior oropharyngeal erythema or uvula swelling.  Neck:     Musculoskeletal: Normal range of motion.  Cardiovascular:     Rate and Rhythm: Normal rate and regular rhythm.     Heart sounds: Normal heart sounds.  Pulmonary:     Effort: Pulmonary effort is normal.     Breath sounds: Normal breath sounds.     Comments: Deep dry cough Lymphadenopathy:     Cervical: No cervical adenopathy.  Skin:    General: Skin is warm and dry.  Neurological:     General: No focal deficit present.     Mental Status: He is alert.  Psychiatric:        Mood and Affect: Mood normal.        Behavior: Behavior normal.    BP (!) 164/90   Pulse 71   Temp 97.8 F (36.6 C) (Oral)   Ht 5\' 11"  (1.803 m)   Wt (!) 312 lb 9.6 oz (141.8 kg)   SpO2 93%   BMI 43.60 kg/m         Assessment & Plan:    Seth NannyWilliam J Martinez in today with chief complaint of Cough (Coughing up blood. . Recent flu 1/4)   1. Upper respiratory infection with cough and congestion 1. Take meds as prescribed 2. Use a cool mist humidifier especially during the winter months and when heat has been humid. 3. Use saline nose sprays frequently 4. Saline irrigations of the nose can be very helpful if done frequently.  * 4X daily for 1 week*  * Use of a nettie pot can be helpful with this. Follow directions with this* 5. Drink plenty of fluids 6. Keep thermostat turn down low 7.For any cough or congestion  Use plain Mucinex- regular strength or max strength is fine   * Children- consult with Pharmacist for dosing 8. For fever or aces or pains- take tylenol or ibuprofen appropriate for age and weight.  * for fevers greater than 101 orally you may alternate ibuprofen and tylenol every  3 hours.    - azithromycin (ZITHROMAX Z-PAK) 250 MG tablet; As directed  Dispense: 6 tablet; Refill: 0 - benzonatate (TESSALON PERLES) 100 MG capsule; Take 1 capsule (100 mg total) by  mouth 3 (three) times daily as needed for cough.  Dispense: 20 capsule; Refill: 0 - predniSONE (DELTASONE) 20 MG tablet; 2 po at sametime daily for 5 days  Dispense: 10 tablet; Refill: 0  Mary-Margaret Daphine DeutscherMartin, FNP

## 2018-10-20 ENCOUNTER — Telehealth: Payer: Self-pay | Admitting: Family Medicine

## 2018-10-20 MED ORDER — RAMIPRIL 5 MG PO CAPS: 5.0000 mg | ORAL_CAPSULE | Freq: Every day | ORAL | 3 refills | Status: DC

## 2018-10-20 NOTE — Telephone Encounter (Signed)
Pt is needing a refill on ramipril (ALTACE) 5 MG capsule sent to [facility]

## 2018-10-24 ENCOUNTER — Ambulatory Visit: Payer: Medicare Other | Admitting: Family Medicine

## 2018-12-20 ENCOUNTER — Other Ambulatory Visit: Payer: Self-pay

## 2018-12-20 ENCOUNTER — Ambulatory Visit (INDEPENDENT_AMBULATORY_CARE_PROVIDER_SITE_OTHER): Payer: Medicare Other | Admitting: Family Medicine

## 2018-12-20 ENCOUNTER — Encounter: Payer: Self-pay | Admitting: Family Medicine

## 2018-12-20 DIAGNOSIS — M5442 Lumbago with sciatica, left side: Secondary | ICD-10-CM | POA: Diagnosis not present

## 2018-12-20 DIAGNOSIS — I10 Essential (primary) hypertension: Secondary | ICD-10-CM

## 2018-12-20 DIAGNOSIS — E559 Vitamin D deficiency, unspecified: Secondary | ICD-10-CM | POA: Diagnosis not present

## 2018-12-20 DIAGNOSIS — E782 Mixed hyperlipidemia: Secondary | ICD-10-CM

## 2018-12-20 DIAGNOSIS — Z789 Other specified health status: Secondary | ICD-10-CM | POA: Diagnosis not present

## 2018-12-20 DIAGNOSIS — E119 Type 2 diabetes mellitus without complications: Secondary | ICD-10-CM | POA: Diagnosis not present

## 2018-12-20 DIAGNOSIS — G8929 Other chronic pain: Secondary | ICD-10-CM | POA: Diagnosis not present

## 2018-12-20 DIAGNOSIS — N401 Enlarged prostate with lower urinary tract symptoms: Secondary | ICD-10-CM | POA: Diagnosis not present

## 2018-12-20 DIAGNOSIS — M5441 Lumbago with sciatica, right side: Secondary | ICD-10-CM | POA: Diagnosis not present

## 2018-12-20 MED ORDER — OXYCODONE-ACETAMINOPHEN 5-325 MG PO TABS: 1.0000 | ORAL_TABLET | Freq: Every day | ORAL | 0 refills | Status: DC | PRN

## 2018-12-20 MED ORDER — PREDNISONE 10 MG PO TABS: ORAL_TABLET | ORAL | 0 refills | Status: DC

## 2018-12-20 NOTE — Progress Notes (Signed)
Virtual Visit Via telephone Note I connected with@ on 12/20/18 by telephone and verified that I am speaking with the correct person or authorized healthcare agent using two identifiers. Seth Martinez is currently located at home and there are no unauthorized people in close proximity. I completed this visit while in a private location in my home .  This visit type was conducted due to national recommendations for restrictions regarding the COVID-19 Pandemic (e.g. social distancing).  This format is felt to be most appropriate for this patient at this time.  All issues noted in this document were discussed and addressed.  No physical exam was performed.    I discussed the limitations, risks, security and privacy concerns of performing an evaluation and management service by telephone and the availability of in person appointments. I also discussed with the patient that there may be a patient responsible charge related to this service. The patient expressed understanding and agreed to proceed.   Date:  12/20/2018    ID:  [name]      [birth year]        [id number]   Patient Care Team Patient Care Team: Ernestina Penna, MD as PCP - General (Family Medicine) Barnett Abu, MD as Consulting Physician (Neurosurgery)  Reason for Visit: Primary Care Follow-up     History of Present Illness & Review of Systems:     Seth Martinez is a 68 y.o. year old male primary care patient that presents today for a telehealth visit.  The patient is doing well overall.  His wife was present during the visit.  He is having more right leg and back pain and it seems to be affecting him with walking and having to stop to rest because of the pain.  Is been going on for about 10 to 14 days.  He says that prednisone has helped this in the past.  He has not had any injury that he is aware of.  He does have morbid obesity.  He has had multiple surgeries on his back in the past.  He denies any chest  pain.  He does have occasional shortness of breath at nighttime when he is sleeping but not any with exertion and he denies any edema.  He has no complaints with swallowing heartburn indigestion nausea vomiting diarrhea or blood in the stool.  He is passing his water well.  The biggest issue as I said has to do with the back pain with walking radiating down his right leg and the outside of the right leg.  His leg just gives out.  Review of systems as stated, otherwise negative.  The patient does not have symptoms concerning for COVID-19 infection (fever, chills, cough, or new shortness of breath).      Current Medications (Verified) Allergies as of 12/20/2018      Reactions   Morphine And Related Anaphylaxis   Penicillins Anaphylaxis   Has patient had a PCN reaction causing immediate rash, facial/tongue/throat swelling, SOB or lightheadedness with hypotension: Yes Has patient had a PCN reaction causing severe rash involving mucus membranes or skin necrosis: No Has patient had a PCN reaction that required hospitalization No Has patient had a PCN reaction occurring within the last 10 years: No If all of the above answers are "NO", then may proceed with Cephalosporin use.   Vancomycin Rash, Other (See Comments)   Vasculitis, Red Man's Syndrome   Amitriptyline Other (See Comments)   Unknown   Antara [  fenofibrate] Other (See Comments)   Unknown   Bextra [valdecoxib] Other (See Comments)   Unknown   Cefdinir Other (See Comments)   Unknown   Celebrex [celecoxib] Other (See Comments)   Unknown   Crestor [rosuvastatin Calcium] Other (See Comments)   Unknown   Escitalopram Oxalate Other (See Comments)   Unknown   Fish Oil Other (See Comments)   Unknown   Ibuprofen Swelling   Naproxen Sodium Other (See Comments)   Unknown   Neurontin [gabapentin] Other (See Comments)   Unknown   Skelaxin Other (See Comments)   Unknown   Ultram [tramadol] Other (See Comments)   Unknown   Vioxx  [rofecoxib] Other (See Comments)   Unknown   Zocor [simvastatin] Other (See Comments)   Unknown      Medication List       Accurate as of December 20, 2018  1:53 PM. If you have any questions, ask your nurse or doctor.        aspirin 325 MG tablet Take 325 mg by mouth daily.   azelastine 0.1 % nasal spray Commonly known as:  ASTELIN Place 1 spray into both nostrils 2 (two) times daily. Use in each nostril as directed   azithromycin 250 MG tablet Commonly known as:  Zithromax Z-Pak As directed   benzonatate 100 MG capsule Commonly known as:  Tessalon Perles Take 1 capsule (100 mg total) by mouth 3 (three) times daily as needed for cough.   cyclobenzaprine 10 MG tablet Commonly known as:  FLEXERIL Take 1 tablet (10 mg total) by mouth 3 (three) times daily as needed for muscle spasms.   EPINEPHrine 0.3 mg/0.3 mL Soaj injection Commonly known as:  EPI-PEN Inject 0.3 mLs (0.3 mg total) into the muscle once.   Icosapent Ethyl 1 g Caps Commonly known as:  Vascepa Take 2 g by mouth 2 (two) times daily.   oxyCODONE-acetaminophen 5-325 MG tablet Commonly known as:  PERCOCET/ROXICET Take 1 tablet by mouth every 8 (eight) hours as needed for severe pain.   oxyCODONE-acetaminophen 5-325 MG tablet Commonly known as:  PERCOCET/ROXICET Take 1 tablet by mouth every 8 (eight) hours as needed for severe pain.   oxyCODONE-acetaminophen 5-325 MG tablet Commonly known as:  PERCOCET/ROXICET Take 1 tablet by mouth every 8 (eight) hours as needed.   predniSONE 20 MG tablet Commonly known as:  Deltasone 2 po at sametime daily for 5 days   ramipril 5 MG capsule Commonly known as:  ALTACE Take 1 capsule (5 mg total) by mouth daily.           Allergies (Verified)    Morphine and related; Penicillins; Vancomycin; Amitriptyline; Antara [fenofibrate]; Bextra [valdecoxib]; Cefdinir; Celebrex [celecoxib]; Crestor [rosuvastatin calcium]; Escitalopram oxalate; Fish oil; Ibuprofen; Naproxen  sodium; Neurontin [gabapentin]; Skelaxin; Ultram [tramadol]; Vioxx [rofecoxib]; and Zocor [simvastatin]  Past Medical History Past Medical History:  Diagnosis Date  . Chronic back pain   . Depression   . Fatigue   . Hyperlipidemia   . Hypertension   . Insomnia   . Joint pain   . Meniscus tear    bilateral knees  . Metabolic syndrome   . Obesity   . Septic arthritis (HCC) October/2014  . Vertigo      Past Surgical History:  Procedure Laterality Date  . BACK SURGERY  1992   lumb fusion  . ELBOW SURGERY  1965   right-fx  . fix screws in sacrum  in office  1993  . fusion lt sacrum with screws  1993  . HERNIA REPAIR     bilat ing 18 weeks old  . I&D EXTREMITY Right 06/23/2013   Procedure: IRRIGATION AND DEBRIDEMENT EXTREMITY;  Surgeon: Dominica SeverinWilliam Gramig, MD;  Location: [facility];  Service: Orthopedics;  Laterality: Right;  . INGUINAL HERNIA REPAIR     bilat age 89  . KNEE ARTHROSCOPY Left 05/04/2013   Procedure: ARTHROSCOPY KNEE WITH WASHOUT;  Surgeon: Sheral Apleyimothy D Murphy, MD;  [facility];  Service: Orthopedics;  Laterality: Left;  . ORIF CLAVICLE FRACTURE  1956   left-age 8  . ORIF FIBULA FRACTURE  1956   lt-accident age [age]yr-  . ORIF PELVIC FRACTURE  1956   age 89  . ORIF RADIUS & ULNA FRACTURES  1956   left-age 8  . rt knee arthroscopic  07/2006  . TEE WITHOUT CARDIOVERSION N/A 05/04/2013   Procedure: TRANSESOPHAGEAL ECHOCARDIOGRAM (TEE);  Surgeon: Donato SchultzMark Skains, MD;  [facility];  Service: Cardiovascular;  Laterality: N/A;  . TENDON REPAIR Right 06/23/2013   Procedure: RIGHT INDEX FINGER AND PALM IRRIGATION AND DEBRIDEMENT FLEXOR  TENOSYNOVECTOMY BX AND REPAIR AS NECESSARY ;  Surgeon: Dominica SeverinWilliam Gramig, MD;  Location:  SURGERY CENTER;  Service: Orthopedics;  Laterality: Right;    Social History   Socioeconomic History  . Marital status: Married    Spouse name: Not on file  . Number of children: Not on file  . Years of education: Not on  file  . Highest education level: Not on file  Occupational History  . Not on file  Social Needs  . Financial resource strain: Not on file  . Food insecurity:    Worry: Not on file    Inability: Not on file  . Transportation needs:    Medical: Not on file    Non-medical: Not on file  Tobacco Use  . Smoking status: Never Smoker  . Smokeless tobacco: Never Used  Substance and Sexual Activity  . Alcohol use: No    Comment: rare  . Drug use: No  . Sexual activity: Not on file  Lifestyle  . Physical activity:    Days per week: Not on file    Minutes per session: Not on file  . Stress: Not on file  Relationships  . Social connections:    Talks on phone: Not on file    Gets together: Not on file    Attends religious service: Not on file    Active member of club or organization: Not on file    Attends meetings of clubs or organizations: Not on file    Relationship status: Not on file  Other Topics Concern  . Not on file  Social History Narrative   ** Merged History Encounter **         Family History  Problem Relation Age of Onset  . Stroke Father   . Diabetes type II Brother       Labs/Other Tests and Data Reviewed:    Wt Readings from Last 3 Encounters:  08/01/18 (!) 312 lb 9.6 oz (141.8 kg)  07/23/18 (!) 315 lb 12.8 oz (143.2 kg)  06/22/18 (!) 310 lb 9.6 oz (140.9 kg)   Temp Readings from Last 3 Encounters:  08/01/18 97.8 F (36.6 C) (Oral)  07/23/18 (!) 100.6 F (38.1 C) (Oral)  06/22/18 (!) 97.5 F (36.4 C) (Oral)   BP Readings from Last 3 Encounters:  08/01/18 (!) 164/90  07/23/18 (!) 152/87  06/22/18 (!) 142/78   Pulse Readings from Last 3  Encounters:  08/01/18 71  07/23/18 73  06/22/18 63     Lab Results  Component Value Date   HGBA1C 5.5 06/22/2018   HGBA1C 5.4 02/15/2018   HGBA1C 5.5 09/24/2017   Lab Results  Component Value Date   MICROALBUR 50 02/15/2014   LDLCALC 92 06/22/2018   CREATININE 1.26 06/22/2018       Chemistry       Component Value Date/Time   NA 142 06/22/2018 1135   K 4.1 06/22/2018 1135   CL 103 06/22/2018 1135   CO2 20 06/22/2018 1135   BUN 11 06/22/2018 1135   CREATININE 1.26 06/22/2018 1135   CREATININE 0.95 06/13/2013 1540      Component Value Date/Time   CALCIUM 9.7 06/22/2018 1135   ALKPHOS 66 06/22/2018 1135   AST 32 06/22/2018 1135   ALT 28 06/22/2018 1135   BILITOT 1.2 06/22/2018 1135         OBSERVATIONS/ OBJECTIVE:     Patient is alert and related his history well to me.  The patient has no objective data to give to me over the phone.  He does not check blood sugars at home.  He does not check blood pressures at home.  He has not weight recently and says his weight is about the same as it usually has been.  He has no sores or infections on his feet and says he has no edema or swelling.  Physical exam deferred due to nature of telephonic visit.  ASSESSMENT & PLAN    Time:   Today, I have spent 26 minutes with the patient via telephone discussing the above including Covid precautions.     Visit Diagnoses: 1. Mixed hyperlipidemia -She is statin intolerant and will continue with as aggressive therapeutic lifestyle changes as possible.  He will come to the office for fasting blood work and we will call him with the results as soon as those become available.  He will continue as requested to work on weight loss through diet and exercise  2. Morbid obesity due to excess calories (HCC) -Continue with efforts to lose weight through diet and exercise  3. Benign prostatic hyperplasia with lower urinary tract symptoms, symptom details unspecified -No complaints today with voiding and patient is not due for a rectal exam at this time for a PSA.  4. Statin intolerance -Continue aggressive therapeutic lifestyle changes  5. Essential hypertension -No blood pressure readings to record from home.  6. Chronic low back pain with bilateral sciatica, unspecified back pain laterality  -This has been worse just recently and it does seem that the pain is worse on the right than the left.  There was no injury described.  It especially hurts with walking any distance and has been going on for 2 to 3 weeks.  The case the prednisone has helped in the past.  We may try this before doing a referral to the orthopedic surgeon. -We will also do a short course of prednisone he will have to watch his blood sugars much more closely while he is doing this. -We will give him 1 refill on his oxycodone and give him 12 tablets.  7. Vitamin D deficiency -Continue vitamin D replacement pending results of lab work  8. Type 2 diabetes mellitus without complication, without long-term current use of insulin (HCC) -Patient should check blood sugars more regularly and make all efforts to get the blood sugar under the best control possible.  He should check his feet regularly and  make sure there is no sign of any infection.  Patient Instructions  Continue with aggressive therapeutic lifestyle changes including diet and exercise to achieve weight loss Continue to practice good respiratory and hand hygiene Continue to drink plenty of water and fluids and stay well-hydrated Come to the office and get fasting blood work and wear mask and use handrails during that visit The order will be placed ahead of time.  Please let my nurse know when you are coming so she can make sure everything is ready for you to have the lab work done.     The above assessment and management plan was discussed with the patient. The patient verbalized understanding of and has agreed to the management plan. Patient is aware to call the clinic if symptoms persist or worsen. Patient is aware when to return to the clinic for a follow-up visit. Patient educated on when it is appropriate to go to the emergency department.    Ernestina Penna, MD [facility] [address] [place], [place], [place] [address] Ph [phone]    Nyra Capes MD

## 2018-12-20 NOTE — Addendum Note (Signed)
Addended by: Ernestina Penna on: 12/20/2018 03:17 PM   Modules accepted: Orders

## 2018-12-20 NOTE — Addendum Note (Signed)
Addended by: Magdalene River on: 12/20/2018 03:20 PM   Modules accepted: Orders

## 2018-12-20 NOTE — Addendum Note (Signed)
Addended by: Magdalene River on: 12/20/2018 03:12 PM   Modules accepted: Orders

## 2018-12-20 NOTE — Patient Instructions (Signed)
Continue with aggressive therapeutic lifestyle changes including diet and exercise to achieve weight loss Continue to practice good respiratory and hand hygiene Continue to drink plenty of water and fluids and stay well-hydrated Come to the office and get fasting blood work and wear mask and use handrails during that visit The order will be placed ahead of time.  Please let my nurse know when you are coming so she can make sure everything is ready for you to have the lab work done.

## 2018-12-20 NOTE — Addendum Note (Signed)
Addended by: Ernestina Penna on: 12/20/2018 03:22 PM   Modules accepted: Orders

## 2018-12-20 NOTE — Addendum Note (Signed)
Addended by: Magdalene River on: 12/20/2018 03:02 PM   Modules accepted: Orders

## 2019-06-28 ENCOUNTER — Telehealth: Payer: Self-pay | Admitting: Family Medicine

## 2019-06-28 NOTE — Telephone Encounter (Signed)
Pt scheduled with Monia Pouch 07/28/19 at 8:20 as he wanted to stay with Roselyn Reef as his nurse. Offered pt a sooner appt but pt wanted to wait till after the first of the year.

## 2019-07-28 ENCOUNTER — Ambulatory Visit (INDEPENDENT_AMBULATORY_CARE_PROVIDER_SITE_OTHER): Payer: Medicare Other | Admitting: Family Medicine

## 2019-07-28 ENCOUNTER — Encounter: Payer: Self-pay | Admitting: Family Medicine

## 2019-07-28 DIAGNOSIS — G8929 Other chronic pain: Secondary | ICD-10-CM

## 2019-07-28 DIAGNOSIS — M545 Low back pain, unspecified: Secondary | ICD-10-CM | POA: Insufficient documentation

## 2019-07-28 DIAGNOSIS — I10 Essential (primary) hypertension: Secondary | ICD-10-CM | POA: Diagnosis not present

## 2019-07-28 DIAGNOSIS — E559 Vitamin D deficiency, unspecified: Secondary | ICD-10-CM

## 2019-07-28 DIAGNOSIS — N401 Enlarged prostate with lower urinary tract symptoms: Secondary | ICD-10-CM

## 2019-07-28 DIAGNOSIS — E782 Mixed hyperlipidemia: Secondary | ICD-10-CM

## 2019-07-28 DIAGNOSIS — R351 Nocturia: Secondary | ICD-10-CM

## 2019-07-28 MED ORDER — RAMIPRIL 5 MG PO CAPS: 5.0000 mg | ORAL_CAPSULE | Freq: Every day | ORAL | 3 refills | Status: DC

## 2019-07-28 MED ORDER — PREDNISONE 10 MG (21) PO TBPK: ORAL_TABLET | ORAL | 0 refills | Status: DC

## 2019-07-28 NOTE — Progress Notes (Signed)
Virtual Visit via telephone Note Due to COVID-19 pandemic this visit was conducted virtually. This visit type was conducted due to national recommendations for restrictions regarding the COVID-19 Pandemic (e.g. social distancing, sheltering in place) in an effort to limit this patient's exposure and mitigate transmission in our community. All issues noted in this document were discussed and addressed.  A physical exam was not performed with this format.   I connected with Seth Martinez on 07/28/2019 at 0750 by telephone and verified that I am speaking with the correct person using two identifiers. ALOIS COLGAN is currently located at home and family is currently with them during visit. The provider, Monia Pouch, FNP is located in their office at time of visit.  I discussed the limitations, risks, security and privacy concerns of performing an evaluation and management service by telephone and the availability of in person appointments. I also discussed with the patient that there may be a patient responsible charge related to this service. The patient expressed understanding and agreed to proceed.  Subjective:  Patient ID: Seth Martinez, male    DOB: Mar 18, 1951, 69 y.o.   MRN: [id number]  Chief Complaint:  Medical Management of Chronic Issues   HPI: Seth Martinez is a 69 y.o. male presenting on 07/28/2019 for Medical Management of Chronic Issues  Seth Martinez is a 69 year old male who presents today for follow-up of his chronic medical conditions.  Patient reports he has been doing well overall.  He has been sheltering in place due to the COVID-19 pandemic.  He states he feels he has lost a couple of pounds.  Has not weighed in a few weeks.  He does not check his blood pressure regularly at home but he is compliant with his medication.  He does try to watch his sodium intake.  He denies chest pain, palpitations, leg swelling, dizziness, or confusion.  He states he does have  chronic pain due to a traumatic accident at the age of 30.  As he does require oxycodone or Flexeril every once in a while to help with this pain.  He has multiple drug allergies and is intolerant to statins so he takes Vascepa for his cholesterol.  He reports he has chronic lower back pain and at times he develops sciatica with this pain.  States he has had an increase in the back pain over the last few weeks.  No new injuries.  No loss of function, bowel or bladder incontinence, weakness, or focal deficits.  He reports the pain is worse in the morning when he wakes up and with certain movements or bending over.  States 8 out of 10 at worst.  Does not require Flexeril or oxycodone at this point.  States he would like to trial a prednisone dosepak to see if beneficial.  He does use over-the-counter topical rubs are somewhat helpful.  Patient has a history of BPH.  He reports nocturia.  No other associated symptoms. He is due for lab work and is willing to come into the office to have this done a future date.  Patient aware this needs to be completed within the next 2 weeks.  Relevant past medical, surgical, family, and social history reviewed and updated as indicated.  Allergies and medications reviewed and updated.   Past Medical History:  Diagnosis Date  . Chronic back pain   . Depression   . Fatigue   . Hyperlipidemia   . Hypertension   . Insomnia   .  Joint pain   . Meniscus tear    bilateral knees  . Metabolic syndrome   . Obesity   . Septic arthritis ([place]) October/2014  . Vertigo     Past Surgical History:  Procedure Laterality Date  . BACK SURGERY  1992   lumb fusion  . [place]   right-fx  . fix screws in sacrum  in office  1993  . fusion lt sacrum with screws  1993  . HERNIA REPAIR     bilat ing 65 weeks old  . I & D EXTREMITY Right 06/23/2013   Procedure: IRRIGATION AND DEBRIDEMENT EXTREMITY;  Surgeon: Roseanne Kaufman, MD;  Location: [facility];   Service: Orthopedics;  Laterality: Right;  . INGUINAL HERNIA REPAIR     bilat age 25  . KNEE ARTHROSCOPY Left 05/04/2013   Procedure: ARTHROSCOPY KNEE WITH WASHOUT;  Surgeon: Renette Butters, MD;  Location: [facility];  Service: Orthopedics;  Laterality: Left;  . ORIF CLAVICLE FRACTURE  1956   left-age 52  . ORIF FIBULA FRACTURE  1956   lt-accident age 57yr  . ORIF PELVIC FRACTURE  1956   [place] 69 . [place] 52  . rt knee arthroscopic  07/2006  . TEE WITHOUT CARDIOVERSION N/A 05/04/2013   Procedure: TRANSESOPHAGEAL ECHOCARDIOGRAM (TEE);  Surgeon: MCandee Furbish MD;  [facility];  Service: Cardiovascular;  Laterality: N/A;  . TENDON REPAIR Right 06/23/2013   Procedure: RIGHT INDEX FINGER AND PALM IRRIGATION AND DEBRIDEMENT FLEXOR  TENOSYNOVECTOMY BX AND REPAIR AS NECESSARY ;  Surgeon: WRoseanne Kaufman MD;  [facility]  Service: Orthopedics;  Laterality: Right;    Social History   Socioeconomic History  . Marital status: Married    Spouse name: Not on file  . Number of children: Not on file  . Years of education: Not on file  . Highest education level: Not on file  Occupational History  . Not on file  Tobacco Use  . Smoking status: Never Smoker  . Smokeless tobacco: Never Used  Substance and Sexual Activity  . Alcohol use: No    Comment: rare  . Drug use: No  . Sexual activity: Not on file  Other Topics Concern  . Not on file  Social History Narrative   ** Merged History Encounter **       Social Determinants of Health   Financial Resource Strain:   . Difficulty of Paying Living Expenses: Not on file  Food Insecurity:   . Worried About RCharity fundraiserin the Last Year: Not on file  . Ran Out of Food in the Last Year: Not on file  Transportation Needs:   . Lack of Transportation (Medical): Not on file  . Lack of Transportation (Non-Medical): Not on file  Physical Activity:   . Days of Exercise per Week:  Not on file  . Minutes of Exercise per Session: Not on file  Stress:   . Feeling of Stress : Not on file  Social Connections:   . Frequency of Communication with Friends and Family: Not on file  . Frequency of Social Gatherings with Friends and Family: Not on file  . Attends Religious Services: Not on file  . Active Member of Clubs or Organizations: Not on file  . Attends CArchivistMeetings: Not on file  . Marital Status: Not on file  Intimate Partner Violence:   . Fear of Current  or Ex-Partner: Not on file  . Emotionally Abused: Not on file  . Physically Abused: Not on file  . Sexually Abused: Not on file    Outpatient Encounter Medications as of 07/28/2019  Medication Sig  . aspirin 325 MG tablet Take 325 mg by mouth daily.  Marland Kitchen azelastine (ASTELIN) 0.1 % nasal spray Place 1 spray into both nostrils 2 (two) times daily. Use in each nostril as directed  . cyclobenzaprine (FLEXERIL) 10 MG tablet Take 1 tablet (10 mg total) by mouth 3 (three) times daily as needed for muscle spasms.  Marland Kitchen EPINEPHrine (EPI-PEN) 0.3 mg/0.3 mL SOAJ injection Inject 0.3 mLs (0.3 mg total) into the muscle once.  Vanessa Kick Ethyl (VASCEPA) 1 g CAPS Take 2 g by mouth 2 (two) times daily. (Patient not taking: Reported on 12/20/2018)  . predniSONE (STERAPRED UNI-PAK 21 TAB) 10 MG (21) TBPK tablet As directed x 6 days  . ramipril (ALTACE) 5 MG capsule Take 1 capsule (5 mg total) by mouth daily.  . [DISCONTINUED] azithromycin (ZITHROMAX Z-PAK) 250 MG tablet As directed (Patient not taking: Reported on 12/20/2018)  . [DISCONTINUED] benzonatate (TESSALON PERLES) 100 MG capsule Take 1 capsule (100 mg total) by mouth 3 (three) times daily as needed for cough. (Patient not taking: Reported on 12/20/2018)  . [DISCONTINUED] oxyCODONE-acetaminophen (PERCOCET/ROXICET) 5-325 MG tablet Take 1 tablet by mouth daily as needed for severe pain.  . [DISCONTINUED] predniSONE (DELTASONE) 10 MG tablet Take 1 tab QID x 3 days, then 1  tab TID x 3 days, then 1 tab BID x 3 days, then 1 tab QD x 3 days  . [DISCONTINUED] ramipril (ALTACE) 5 MG capsule Take 1 capsule (5 mg total) by mouth daily.   No facility-administered encounter medications on file as of 07/28/2019.    Allergies  Allergen Reactions  . Morphine And Related Anaphylaxis  . Penicillins Anaphylaxis    Has patient had a PCN reaction causing immediate rash, facial/tongue/throat swelling, SOB or lightheadedness with hypotension: Yes Has patient had a PCN reaction causing severe rash involving mucus membranes or skin necrosis: No Has patient had a PCN reaction that required hospitalization No Has patient had a PCN reaction occurring within the last 10 years: No If all of the above answers are "NO", then may proceed with Cephalosporin use.  . Vancomycin Rash and Other (See Comments)    Vasculitis, Red Man's Syndrome  . Amitriptyline Other (See Comments)    Unknown  . Antara [Fenofibrate] Other (See Comments)    Unknown  . Bextra [Valdecoxib] Other (See Comments)    Unknown  . Cefdinir Other (See Comments)    Unknown  . Celebrex [Celecoxib] Other (See Comments)    Unknown  . Crestor [Rosuvastatin Calcium] Other (See Comments)    Unknown  . Escitalopram Oxalate Other (See Comments)    Unknown  . Fish Oil Other (See Comments)    Unknown  . Ibuprofen Swelling  . Naproxen Sodium Other (See Comments)    Unknown  . Neurontin [Gabapentin] Other (See Comments)    Unknown  . Skelaxin Other (See Comments)    Unknown  . Ultram [Tramadol] Other (See Comments)    Unknown  . Vioxx [Rofecoxib] Other (See Comments)    Unknown  . Zocor [Simvastatin] Other (See Comments)    Unknown    Review of Systems  Constitutional: Negative for activity change, appetite change, chills, diaphoresis, fatigue, fever and unexpected weight change.  HENT: Negative.   Eyes: Negative.   Respiratory: Negative for  cough, chest tightness and shortness of breath.   Cardiovascular:  Negative for chest pain, palpitations and leg swelling.  Gastrointestinal: Negative for abdominal pain, blood in stool, constipation, diarrhea, nausea, rectal pain and vomiting.  Endocrine: Negative.   Genitourinary: Positive for frequency (nocturia). Negative for decreased urine volume, difficulty urinating, discharge, dysuria, flank pain, hematuria, penile pain, penile swelling, scrotal swelling, testicular pain and urgency.  Musculoskeletal: Positive for arthralgias, back pain, gait problem and myalgias.  Skin: Negative.  Negative for rash.  Allergic/Immunologic: Negative.   Neurological: Negative for dizziness, tremors, seizures, syncope, facial asymmetry, speech difficulty, weakness, light-headedness, numbness and headaches.  Hematological: Negative.   Psychiatric/Behavioral: Negative for confusion, hallucinations, sleep disturbance and suicidal ideas.  All other systems reviewed and are negative.        Observations/Objective: No vital signs or physical exam, this was a telephone or virtual health encounter.  Pt alert and oriented, answers all questions appropriately, and able to speak in full sentences.    Assessment and Plan: Seth Martinez was seen today for medical management of chronic issues.  Diagnoses and all orders for this visit:  Essential hypertension Patient has not been checking blood pressure at home but is compliant with medication.  Diet and exercise encouraged.  Patient aware of recommended sodium intake per day.  Lab work is due and patient is aware that he needs to have this completed within the next 2 weeks.  Orders placed. -     CBC with Differential/Platelet; Future -     CMP14+EGFR; Future -     Lipid panel; Future -     Thyroid Panel With TSH; Future -     Microalbumin / creatinine urine ratio; Future -     ramipril (ALTACE) 5 MG capsule; Take 1 capsule (5 mg total) by mouth daily.  Mixed hyperlipidemia Diet encouraged - increase intake of fresh fruits and  vegetables, increase intake of lean proteins. Bake, broil, or grill foods. Avoid fried, greasy, and fatty foods. Avoid fast foods. Increase intake of fiber-rich whole grains. Exercise encouraged - at least 150 minutes per week and advance as tolerated.  Goal BMI < 25. Continue medications as prescribed. Follow up in 3-6 months as discussed.  -     Lipid panel; Future  Vitamin D deficiency Labs pending. If indicated, will initiate repletion therspy. Eat foods rich in Vit D including milk, orange juice, yogurt with vitamin D added, salmon or mackerel, canned tuna fish, cereals with vitamin D added, and cod liver oil. Get out in the sun but make sure to wear at least SPF 30 sunscreen.  -     Vitamin D 25 hydroxy; Future  Morbid obesity ([place]) Diet and exercise encouraged.  Aware goal BMI should be around 25.  Labs ordered, patient aware to come to office to have these drawn. -     CBC with Differential/Platelet; Future -     CMP14+EGFR; Future -     Lipid panel; Future -     Thyroid Panel With TSH; Future  Benign prostatic hyperplasia with nocturia Pt ware to report any new or worsening symptoms. Will check PSA.  -     PSA, total and free; Future  Chronic bilateral low back pain without sciatica Ongoing lower back pain.  Has been increased over the last couple of weeks.  Red flags concerning for cauda equina syndrome.  Will dose with below to see if beneficial.  Patient aware to report any new, worsening, or persistent symptoms. -  predniSONE (STERAPRED UNI-PAK 21 TAB) 10 MG (21) TBPK tablet; As directed x 6 days     Follow Up Instructions: Return in about 3 months (around 10/26/2019), or if symptoms worsen or fail to improve.    I discussed the assessment and treatment plan with the patient. The patient was provided an opportunity to ask questions and all were answered. The patient agreed with the plan and demonstrated an understanding of the instructions.   The patient was advised to  call back or seek an in-person evaluation if the symptoms worsen or if the condition fails to improve as anticipated.  The above assessment and management plan was discussed with the patient. The patient verbalized understanding of and has agreed to the management plan. Patient is aware to call the clinic if they develop any new symptoms or if symptoms persist or worsen. Patient is aware when to return to the clinic for a follow-up visit. Patient educated on when it is appropriate to go to the emergency department.    I provided 25 minutes of non-face-to-face time during this encounter. The call started at 0750. The call ended at 0815. The other time was used for coordination of care.    Monia Pouch, FNP-C [facility] [address] [place], [place] [address] [phone] 07/28/2019

## 2019-09-28 ENCOUNTER — Telehealth: Payer: Self-pay | Admitting: Family Medicine

## 2019-09-28 NOTE — Chronic Care Management (AMB) (Signed)
  Chronic Care Management   Outreach Note  09/28/2019 Name: Seth Martinez MRN: [id number] DOB: [birth year]  Seth Martinez is a 69 y.o. year old male who is a primary care patient of Sonny Masters, FNP. I reached out to [name] by phone today in response to a referral sent by [name]'s health plan.     An unsuccessful telephone outreach was attempted today. The patient was referred to the case management team for assistance with care management and care coordination.   Follow Up Plan: A HIPPA compliant phone message was left for the patient providing contact information and requesting a return call.  The care management team will reach out to the patient again over the next 7 days.  If patient returns call to provider office, please advise to call Embedded Care Management Care Guide Penne Lash  at [phone]  Penne Lash, RMA Care Guide, Embedded Care Coordination [facility]  [place], [place] [address] Direct Dial: [phone] [email or web address] Website: [email or web address]

## 2019-10-02 NOTE — Chronic Care Management (AMB) (Signed)
  Chronic Care Management   Outreach Note  10/02/2019 Name: ERRIC MACHNIK MRN: [id number] DOB: Jun 20, 1951  BRIGHTEN ORNDOFF is a 69 y.o. year old male who is a primary care patient of Sonny Masters, FNP. I reached out to [name] by phone today in response to a referral sent by Mr. Jeriah Corkum Winslett's health plan.     A second unsuccessful telephone outreach was attempted today. The patient was referred to the case management team for assistance with care management and care coordination.   Follow Up Plan: A HIPPA compliant phone message was left for the patient providing contact information and requesting a return call.  The care management team will reach out to the patient again over the next 7 days.  If patient returns call to provider office, please advise to call Embedded Care Management Care Guide Penne Lash  at [phone]  Penne Lash, RMA Care Guide, Embedded Care Coordination [facility]  [place], [place] [address] Direct Dial: [phone] [email or web address] Website: [email or web address]

## 2019-10-04 NOTE — Chronic Care Management (AMB) (Signed)
°  Chronic Care Management   Outreach Note  10/04/2019 Name: Seth Martinez MRN: [id number] DOB: 1951-04-13  Seth Martinez is a 69 y.o. year old male who is a primary care patient of Sonny Masters, FNP. I reached out to Seth Martinez by phone today in response to a referral sent by Seth Martinez's health plan.     Third unsuccessful telephone outreach was attempted today. The patient was referred to the case management team for assistance with care management and care coordination. The patient's primary care provider has been notified of our unsuccessful attempts to make or maintain contact with the patient. The care management team is pleased to engage with this patient at any time in the future should he/she be interested in assistance from the care management team.   Follow Up Plan: A HIPPA compliant phone message was left for the patient providing contact information and requesting a return call.  The care management team is available to follow up with the patient after provider conversation with the patient regarding recommendation for care management engagement and subsequent re-referral to the care management team.   Penne Lash, RMA Care Guide, Embedded Care Coordination [facility]  [place], [place] [address] Direct Dial: [phone] [email or web address]: [email or web address]

## 2019-10-13 ENCOUNTER — Ambulatory Visit (INDEPENDENT_AMBULATORY_CARE_PROVIDER_SITE_OTHER): Payer: Medicare Other | Admitting: *Deleted

## 2019-10-13 DIAGNOSIS — Z0000 Encounter for general adult medical examination without abnormal findings: Secondary | ICD-10-CM
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
## 2019-11-15 ENCOUNTER — Other Ambulatory Visit: Payer: Self-pay

## 2019-11-15 ENCOUNTER — Other Ambulatory Visit: Payer: Self-pay | Admitting: Family Medicine

## 2019-11-15 ENCOUNTER — Other Ambulatory Visit: Payer: Medicare Other

## 2019-11-15 DIAGNOSIS — E559 Vitamin D deficiency, unspecified: Secondary | ICD-10-CM

## 2019-11-15 DIAGNOSIS — I10 Essential (primary) hypertension: Secondary | ICD-10-CM

## 2019-11-15 DIAGNOSIS — N401 Enlarged prostate with lower urinary tract symptoms: Secondary | ICD-10-CM

## 2019-11-15 DIAGNOSIS — E782 Mixed hyperlipidemia: Secondary | ICD-10-CM

## 2019-11-15 LAB — BAYER DCA HB A1C WAIVED: HB A1C (BAYER DCA - WAIVED): 5.8 % (ref <7.0)

## 2019-11-15 NOTE — Addendum Note (Signed)
Addended by: Raliegh Ip on: 11/15/2019 04:57 PM   Modules accepted: Orders

## 2019-11-21 ENCOUNTER — Other Ambulatory Visit: Payer: Self-pay

## 2019-11-21 ENCOUNTER — Ambulatory Visit (INDEPENDENT_AMBULATORY_CARE_PROVIDER_SITE_OTHER): Payer: Medicare Other | Admitting: Family Medicine

## 2019-11-21 ENCOUNTER — Encounter: Payer: Self-pay | Admitting: Family Medicine

## 2019-11-21 VITALS — BP 127/81 | HR 88 | Temp 98.1°F | Ht 71.0 in | Wt 313.0 lb

## 2019-11-21 DIAGNOSIS — M79672 Pain in left foot: Secondary | ICD-10-CM

## 2019-11-21 DIAGNOSIS — B356 Tinea cruris: Secondary | ICD-10-CM

## 2019-11-21 DIAGNOSIS — N401 Enlarged prostate with lower urinary tract symptoms: Secondary | ICD-10-CM | POA: Diagnosis not present

## 2019-11-21 DIAGNOSIS — I10 Essential (primary) hypertension: Secondary | ICD-10-CM | POA: Diagnosis not present

## 2019-11-21 DIAGNOSIS — R351 Nocturia: Secondary | ICD-10-CM

## 2019-11-21 DIAGNOSIS — Z7689 Persons encountering health services in other specified circumstances: Secondary | ICD-10-CM

## 2019-11-21 DIAGNOSIS — E782 Mixed hyperlipidemia: Secondary | ICD-10-CM

## 2019-11-21 DIAGNOSIS — Z8739 Personal history of other diseases of the musculoskeletal system and connective tissue: Secondary | ICD-10-CM

## 2019-11-21 DIAGNOSIS — M79671 Pain in right foot: Secondary | ICD-10-CM

## 2019-11-21 MED ORDER — KETOCONAZOLE 2 % EX CREA: 1.0000 "application " | TOPICAL_CREAM | Freq: Every day | CUTANEOUS | 0 refills | Status: DC

## 2019-11-21 MED ORDER — AZELASTINE HCL 0.1 % NA SOLN: 1.0000 | Freq: Two times a day (BID) | NASAL | 11 refills | Status: DC
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
## 2019-11-21 NOTE — Patient Instructions (Signed)
Jock Itch  Jock itch is an itchy rash in the groin and upper thigh area. It is a skin infection that is caused by a type of germ that lives in dark, damp places (fungus). The rash usually goes away in 2-3 weeks with treatment. Follow these instructions at home: Skin care  Use skin creams, ointments, or powders exactly as told by your doctor.  Wear loose-fitting clothes. Clothes should not rub against your groin area. Men should wear boxer shorts or loose-fitting underwear.  Keep your groin area clean and dry. ? Change your underwear every day. ? Change out of wet bathing suits as soon as you can. ? After bathing, use a separate towel to dry your groin area. Dry the area gently and completely.  Avoid hot baths and showers. Hot water can make itching worse.  Do not scratch the area. General instructions  Take and apply over-the-counter and prescription medicines only as told by your doctor.  Do not share towels or clothing with other people.  Wash your hands often with soap and water, especially after touching your groin area. If you do not have soap and water, use alcohol-based hand sanitizer. Contact a doctor if:  Your rash: ? Gets worse. ? Does not get better after 2 weeks of treatment. ? Spreads. ? Comes back after treatment is done.  You have any of the following: ? A fever. ? New or worsening redness, swelling, or pain around your rash. ? Fluid, blood, or pus coming from your rash. Summary  Jock itch is an itchy rash. It affects the groin and upper thigh area.  Jock itch usually goes away in 2-3 weeks with treatment.  Keep your groin area clean and dry. This information is not intended to replace advice given to you by your health care provider. Make sure you discuss any questions you have with your health care provider. Document Revised: 06/18/2017 Document Reviewed: 06/16/2017 Elsevier Patient Education  2020 Elsevier Inc[employer]  

## 2019-12-26 LAB — CMP14+EGFR
ALT: 28 IU/L (ref 0–44)
AST: 33 IU/L (ref 0–40)
Albumin/Globulin Ratio: 1.8 (ref 1.2–2.2)
Albumin: 4.4 g/dL (ref 3.8–4.8)
Alkaline Phosphatase: 62 IU/L (ref 39–117)
BUN/Creatinine Ratio: 10 (ref 10–24)
BUN: 11 mg/dL (ref 8–27)
Bilirubin Total: 0.7 mg/dL (ref 0.0–1.2)
CO2: 23 mmol/L (ref 20–29)
Calcium: 9.4 mg/dL (ref 8.6–10.2)
Chloride: 103 mmol/L (ref 96–106)
Creatinine, Ser: 1.1 mg/dL (ref 0.76–1.27)
GFR calc Af Amer: 78 mL/min/{1.73_m2} (ref >59)
GFR calc non Af Amer: 68 mL/min/{1.73_m2} (ref >59)
Globulin, Total: 2.4 g/dL (ref 1.5–4.5)
Glucose: 97 mg/dL (ref 65–99)
Potassium: 4.5 mmol/L (ref 3.5–5.2)
Sodium: 139 mmol/L (ref 134–144)
Total Protein: 6.8 g/dL (ref 6.0–8.5)

## 2019-12-26 LAB — LIPID PANEL
Chol/HDL Ratio: 4.5 ratio (ref 0.0–5.0)
Cholesterol, Total: 167 mg/dL (ref 100–199)
HDL: 37 mg/dL — ABNORMAL LOW (ref >39)
LDL Chol Calc (NIH): 94 mg/dL (ref 0–99)
Triglycerides: 209 mg/dL — ABNORMAL HIGH (ref 0–149)
VLDL Cholesterol Cal: 36 mg/dL (ref 5–40)

## 2019-12-26 LAB — PSA: Prostate Specific Ag, Serum: 0.7 ng/mL (ref 0.0–4.0)

## 2019-12-26 LAB — TSH: TSH: 3.53 u[IU]/mL (ref 0.450–4.500)

## 2020-03-26 ENCOUNTER — Ambulatory Visit: Payer: Medicare Other | Admitting: Family Medicine

## 2020-04-17 NOTE — Telephone Encounter (Signed)
Error

## 2020-05-13 ENCOUNTER — Ambulatory Visit (INDEPENDENT_AMBULATORY_CARE_PROVIDER_SITE_OTHER): Payer: Medicare Other | Admitting: Family Medicine

## 2020-05-13 ENCOUNTER — Encounter: Payer: Self-pay | Admitting: Family Medicine

## 2020-05-13 ENCOUNTER — Other Ambulatory Visit: Payer: Self-pay

## 2020-05-13 VITALS — BP 163/83 | HR 62 | Temp 97.0°F | Ht 71.0 in | Wt 318.2 lb

## 2020-05-13 DIAGNOSIS — B356 Tinea cruris: Secondary | ICD-10-CM

## 2020-05-13 DIAGNOSIS — I10 Essential (primary) hypertension: Secondary | ICD-10-CM | POA: Diagnosis not present

## 2020-05-13 DIAGNOSIS — Z8739 Personal history of other diseases of the musculoskeletal system and connective tissue: Secondary | ICD-10-CM | POA: Diagnosis not present

## 2020-05-13 DIAGNOSIS — M65331 Trigger finger, right middle finger: Secondary | ICD-10-CM

## 2020-05-13 MED ORDER — RAMIPRIL 5 MG PO CAPS: 5.0000 mg | ORAL_CAPSULE | Freq: Every day | ORAL | 3 refills | Status: DC
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
## 2020-05-13 NOTE — Patient Instructions (Signed)
As we discussed, I reviewed your case with Dr Daiva Eves with infectious disease in [place], he did not feel that ongoing sed rates were necessary in the absence of clinically concerning symptoms.  We also discussed consideration for sleep testing.  I worry about sleep apnea in you.  I have included more information on this and on trigger finger (Dr Dettinger or Paulene Floor can give an injection for this if you desire in the future).  Start using Melatonin at bedtime.  See if this helps with sleep.  You had labs performed today.  You will be contacted with the results of the labs once they are available, usually in the next 3 business days for routine lab work.  If you have an active my chart account, they will be released to your MyChart.  If you prefer to have these labs released to you via telephone, please let us know.  If you had a pap smear or biopsy performed, expect to be contacted in about 7-10 days.   Sleep Apnea Sleep apnea is a condition in which breathing pauses or becomes shallow during sleep. Episodes of sleep apnea usually last 10 seconds or longer, and they may occur as many as 20 times an hour. Sleep apnea disrupts your sleep and keeps your body from getting the rest that it needs. This condition can increase your risk of certain health problems, including:  Heart attack.  Stroke.  Obesity.  Diabetes.  Heart failure.  Irregular heartbeat. What are the causes? There are three kinds of sleep apnea:  Obstructive sleep apnea. This kind is caused by a blocked or collapsed airway.  Central sleep apnea. This kind happens when the part of the brain that controls breathing does not send the correct signals to the muscles that control breathing.  Mixed sleep apnea. This is a combination of obstructive and central sleep apnea. The most common cause of this condition is a collapsed or blocked airway. An airway can collapse or become blocked if:  Your throat muscles are  abnormally relaxed.  Your tongue and tonsils are larger than normal.  You are overweight.  Your airway is smaller than normal. What increases the risk? You are more likely to develop this condition if you:  Are overweight.  Smoke.  Have a smaller than normal airway.  Are elderly.  Are male.  Drink alcohol.  Take sedatives or tranquilizers.  Have a family history of sleep apnea. What are the signs or symptoms? Symptoms of this condition include:  Trouble staying asleep.  Daytime sleepiness and tiredness.  Irritability.  Loud snoring.  Morning headaches.  Trouble concentrating.  Forgetfulness.  Decreased interest in sex.  Unexplained sleepiness.  Mood swings.  Personality changes.  Feelings of depression.  Waking up often during the night to urinate.  Dry mouth.  Sore throat. How is this diagnosed? This condition may be diagnosed with:  A medical history.  A physical exam.  A series of tests that are done while you are sleeping (sleep study). These tests are usually done in a sleep lab, but they may also be done at home. How is this treated? Treatment for this condition aims to restore normal breathing and to ease symptoms during sleep. It may involve managing health issues that can affect breathing, such as high blood pressure or obesity. Treatment may include:  Sleeping on your side.  Using a decongestant if you have nasal congestion.  Avoiding the use of depressants, including alcohol, sedatives, and narcotics.  Losing  weight if you are overweight.  Making changes to your diet.  Quitting smoking.  Using a device to open your airway while you sleep, such as: ? An oral appliance. This is a custom-made mouthpiece that shifts your lower jaw forward. ? A continuous positive airway pressure (CPAP) device. This device blows air through a mask when you breathe out (exhale). ? A nasal expiratory positive airway pressure (EPAP) device. This  device has valves that you put into each nostril. ? A bi-level positive airway pressure (BPAP) device. This device blows air through a mask when you breathe in (inhale) and breathe out (exhale).  Having surgery if other treatments do not work. During surgery, excess tissue is removed to create a wider airway. It is important to get treatment for sleep apnea. Without treatment, this condition can lead to:  High blood pressure.  Coronary artery disease.  In men, an inability to achieve or maintain an erection (impotence).  Reduced thinking abilities. Follow these instructions at home: Lifestyle  Make any lifestyle changes that your health care provider recommends.  Eat a healthy, well-balanced diet.  Take steps to lose weight if you are overweight.  Avoid using depressants, including alcohol, sedatives, and narcotics.  Do not use any products that contain nicotine or tobacco, such as cigarettes, e-cigarettes, and chewing tobacco. If you need help quitting, ask your health care provider. General instructions  Take over-the-counter and prescription medicines only as told by your health care provider.  If you were given a device to open your airway while you sleep, use it only as told by your health care provider.  If you are having surgery, make sure to tell your health care provider you have sleep apnea. You may need to bring your device with you.  Keep all follow-up visits as told by your health care provider. This is important. Contact a health care provider if:  The device that you received to open your airway during sleep is uncomfortable or does not seem to be working.  Your symptoms do not improve.  Your symptoms get worse. Get help right away if:  You develop: ? Chest pain. ? Shortness of breath. ? Discomfort in your back, arms, or stomach.  You have: ? Trouble speaking. ? Weakness on one side of your body. ? Drooping in your face. These symptoms may represent  a serious problem that is an emergency. Do not wait to see if the symptoms will go away. Get medical help right away. Call your local emergency services (911 in the [place] Do not drive yourself to the hospital. Summary  Sleep apnea is a condition in which breathing pauses or becomes shallow during sleep.  The most common cause is a collapsed or blocked airway.  The goal of treatment is to restore normal breathing and to ease symptoms during sleep. This information is not intended to replace advice given to you by your health care provider. Make sure you discuss any questions you have with your health care provider. Document Revised: 12/21/2018 Document Reviewed: 03/01/2018 Elsevier Patient Education  2020 [employer]   Trigger Finger  Trigger finger, also called stenosing tenosynovitis,  is a condition that causes a finger to get stuck in a bent position. Each finger has a tendon, which is a tough, cord-like tissue that connects muscle to bone, and each tendon passes through a tunnel of tissue called a tendon sheath. To move your finger, your tendon needs to glide freely through the sheath. Trigger finger happens when the  tendon or the sheath thickens, making it difficult to move your finger. Trigger finger can affect any finger or a thumb. It may affect more than one finger. Mild cases may clear up with rest and medicine. Severe cases require more treatment. What are the causes? Trigger finger is caused by a thickened finger tendon or tendon sheath. The cause of this thickening is not known. What increases the risk? The following factors may make you more likely to develop this condition:  Doing activities that require a strong grip.  Having rheumatoid arthritis, gout, or diabetes.  Being 7040-71 years old.  Being male. What are the signs or symptoms? Symptoms of this condition include:  Pain when bending or straightening your finger.  Tenderness or swelling where your finger  attaches to the palm of your hand.  A lump in the palm of your hand or on the inside of your finger.  Hearing a noise like a pop or a snap when you try to straighten your finger.  Feeling a catching or locking sensation when you try to straighten your finger.  Being unable to straighten your finger. How is this diagnosed? This condition is diagnosed based on your symptoms and a physical exam. How is this treated? This condition may be treated by:  Resting your finger and avoiding activities that make symptoms worse.  Wearing a finger splint to keep your finger extended.  Taking NSAIDs, such as ibuprofen, to relieve pain and swelling.  Doing gentle exercises to stretch the finger as told by your health care provider.  Having medicine that reduces swelling and inflammation (steroids) injected into the tendon sheath. Injections may need to be repeated.  Having surgery to open the tendon sheath. This may be done if other treatments do not work and you cannot straighten your finger. You may need physical therapy after surgery. Follow these instructions at home: If you have a splint:  Wear the splint as told by your health care provider. Remove it only as told by your health care provider.  Loosen it if your fingers tingle, become numb, or turn cold and blue.  Keep it clean.  If the splint is not waterproof: ? Do not let it get wet. ? Cover it with a watertight covering when you take a bath or shower. Managing pain, stiffness, and swelling     If directed, apply heat to the affected area as often as told by your health care provider. Use the heat source that your health care provider recommends, such as a moist heat pack or a heating pad.  Place a towel between your skin and the heat source.  Leave the heat on for 20-30 minutes.  Remove the heat if your skin turns bright red. This is especially important if you are unable to feel pain, heat, or cold. You may have a greater  risk of getting burned. If directed, put ice on the painful area. To do this:  If you have a removable splint, remove it as told by your health care provider.  Put ice in a plastic bag.  Place a towel between your skin and the bag or between your splint and the bag.  Leave the ice on for 20 minutes, 2-3 times a day.  Activity  Rest your finger as told by your health care provider. Avoid activities that make the pain worse.  Return to your normal activities as told by your health care provider. Ask your health care provider what activities are safe for you.  Do exercises as told by your health care provider.  Ask your health care provider when it is safe to drive if you have a splint on your hand. General instructions  Take over-the-counter and prescription medicines only as told by your health care provider.  Keep all follow-up visits as told by your health care provider. This is important. Contact a health care provider if:  Your symptoms are not improving with home care. Summary  Trigger finger, also called stenosing tenosynovitis, causes your finger to get stuck in a bent position. This can make it difficult and painful to straighten your finger.  This condition develops when a finger tendon or tendon sheath thickens.  Treatment may include resting your finger, wearing a splint, and taking medicines.  In severe cases, surgery to open the tendon sheath may be needed. This information is not intended to replace advice given to you by your health care provider. Make sure you discuss any questions you have with your health care provider. Document Revised: 11/21/2018 Document Reviewed: 11/21/2018 Elsevier Patient Education  2020 [employer].

## 2020-10-13 ENCOUNTER — Encounter: Payer: Self-pay | Admitting: Family Medicine

## 2020-10-14 NOTE — Telephone Encounter (Signed)
Ok with me 

## 2020-10-15 ENCOUNTER — Ambulatory Visit (INDEPENDENT_AMBULATORY_CARE_PROVIDER_SITE_OTHER): Payer: Medicare Other

## 2020-10-15 DIAGNOSIS — Z0000 Encounter for general adult medical examination without abnormal findings: Secondary | ICD-10-CM

## 2020-10-15 NOTE — Progress Notes (Signed)
MEDICARE ANNUAL WELLNESS VISIT  10/15/2020  Telephone Visit Disclaimer This Medicare AWV was conducted by telephone due to national recommendations for restrictions regarding the COVID-19 Pandemic (e.g. social distancing).  I verified, using two identifiers, that I am speaking with Seth Martinez or their authorized healthcare agent. I discussed the limitations, risks, security, and privacy concerns of performing an evaluation and management service by telephone and the potential availability of an in-person appointment in the future. The patient expressed understanding and agreed to proceed.  Location of Patient: Home Location of Provider (nurse):  [facility]  Subjective:    Seth Martinez is a 70 y.o. male patient of Seth Ip, DO who had a Medicare Annual Wellness Visit today via telephone. Seth Martinez is Retired and lives with their spouse. He has two children. He reports that he is socially active and does interact with friends/family regularly. He is minimally physically active and enjoys traveling to the beach when he is able.  Patient Care Team: Seth Ip, DO as PCP - General (Family Medicine) Barnett Abu, MD as Consulting Physician (Neurosurgery)  Advanced Directives 10/15/2020 10/13/2019 04/27/2018 06/22/2013 05/16/2013 05/02/2013  Does Patient Have a Medical Advance Directive? No No No Patient does not have advance directive;Patient would not like information Patient does not have advance directive Patient does not have advance directive  Would patient like information on creating a medical advance directive? No - Patient declined No - Patient declined Yes (MAU/Ambulatory/Procedural Areas - Information given) - - -  Pre-existing out of facility DNR order (yellow form or pink MOST form) - - - - No -    Hospital Utilization Over the Past 12 Months: # of hospitalizations or ER visits: 0 # of surgeries: 0  Review of Systems    Patient reports that his  overall health is worse compared to last year.  History obtained from chart review and the patient  Patient Reported Readings (BP, Pulse, CBG, Weight, etc) none  Pain Assessment Pain : 0-10 Pain Type: Chronic pain Pain Location: Knee Pain Orientation: Right,Left Pain Descriptors / Indicators: Aching Pain Onset: More than a month ago Pain Frequency: Constant     Current Medications & Allergies (verified) Allergies as of 10/15/2020      Reactions   Morphine And Related Anaphylaxis   Penicillins Anaphylaxis   Has patient had a PCN reaction causing immediate rash, facial/tongue/throat swelling, SOB or lightheadedness with hypotension: Yes Has patient had a PCN reaction causing severe rash involving mucus membranes or skin necrosis: No Has patient had a PCN reaction that required hospitalization No Has patient had a PCN reaction occurring within the last 10 years: No If all of the above answers are "NO", then may proceed with Cephalosporin use.   Vancomycin Rash, Other (See Comments)   Vasculitis, Red Man's Syndrome   Amitriptyline Other (See Comments)   Unknown   Antara [fenofibrate] Other (See Comments)   Unknown   Bextra [valdecoxib] Other (See Comments)   Unknown   Cefdinir Other (See Comments)   Unknown   Celebrex [celecoxib] Other (See Comments)   Unknown   Crestor [rosuvastatin Calcium] Other (See Comments)   Unknown   Escitalopram Oxalate Other (See Comments)   Unknown   Fish Oil Other (See Comments)   Unknown   Ibuprofen Swelling   Naproxen Sodium Other (See Comments)   Unknown   Neurontin [gabapentin] Other (See Comments)   Unknown   Skelaxin Other (See Comments)   Unknown   Ultram [tramadol]  Other (See Comments)   Unknown   Vioxx [rofecoxib] Other (See Comments)   Unknown   Zocor [simvastatin] Other (See Comments)   Unknown      Medication List       Accurate as of October 15, 2020  3:09 PM. If you have any questions, ask your nurse or doctor.         STOP taking these medications   azelastine 0.1 % nasal spray Commonly known as: ASTELIN     TAKE these medications   aspirin 325 MG tablet Take 325 mg by mouth daily.   EPINEPHrine 0.3 mg/0.3 mL Soaj injection Commonly known as: EPI-PEN Inject 0.3 mLs (0.3 mg total) into the muscle once.   NASACORT ALLERGY 24HR NA Place into the nose.   ramipril 5 MG capsule Commonly known as: ALTACE Take 1 capsule (5 mg total) by mouth daily.       History (reviewed): Past Medical History:  Diagnosis Date  . Chronic back pain   . Depression   . Fatigue   . Hyperlipidemia   . Hypertension   . Insomnia   . Joint pain   . Meniscus tear    bilateral knees  . Metabolic syndrome   . Obesity   . Septic arthritis (HCC) October/2014  . Vertigo    Past Surgical History:  Procedure Laterality Date  . BACK SURGERY  1992   lumb fusion  . ELBOW SURGERY  1965   right-fx  . fix screws in sacrum  in office  1993  . fusion lt sacrum with screws  1993  . HERNIA REPAIR     bilat ing 598 weeks old  . I & D EXTREMITY Right 06/23/2013   Procedure: IRRIGATION AND DEBRIDEMENT EXTREMITY;  Surgeon: Dominica SeverinWilliam Gramig, MD;  Location: [facility];  Service: Orthopedics;  Laterality: Right;  . INGUINAL HERNIA REPAIR     bilat age 89  . KNEE ARTHROSCOPY Left 05/04/2013   Procedure: ARTHROSCOPY KNEE WITH WASHOUT;  Surgeon: Sheral Apleyimothy D Murphy, MD;  [facility];  Service: Orthopedics;  Laterality: Left;  . ORIF CLAVICLE FRACTURE  1956   left-age 27  . ORIF FIBULA FRACTURE  1956   lt-accident age [age]yr-  . ORIF PELVIC FRACTURE  1956   age 89  . ORIF RADIUS & ULNA FRACTURES  1956   left-age 27  . rt knee arthroscopic  07/2006  . TEE WITHOUT CARDIOVERSION N/A 05/04/2013   Procedure: TRANSESOPHAGEAL ECHOCARDIOGRAM (TEE);  Surgeon: Donato SchultzMark Skains, MD;  [facility];  Service: Cardiovascular;  Laterality: N/A;  . TENDON REPAIR Right 06/23/2013   Procedure: RIGHT INDEX FINGER AND PALM  IRRIGATION AND DEBRIDEMENT FLEXOR  TENOSYNOVECTOMY BX AND REPAIR AS NECESSARY ;  Surgeon: Dominica SeverinWilliam Gramig, MD;  Location: [facility];  Service: Orthopedics;  Laterality: Right;   Family History  Problem Relation Age of Onset  . Stroke Father   . Diabetes type II Brother    Social History   Socioeconomic History  . Marital status: Married    Spouse name: Annice PihJackie  . Number of children: 2  . Years of education: 6312  . Highest education level: 12th grade  Occupational History  . Occupation: retired  Tobacco Use  . Smoking status: Never Smoker  . Smokeless tobacco: Never Used  Vaping Use  . Vaping Use: Never used  Substance and Sexual Activity  . Alcohol use: No    Comment: rare  . Drug use: No  . Sexual activity: Not  on file  Other Topics Concern  . Not on file  Social History Narrative   ** Merged History Encounter **       Seth Martinez is retired and lives at home with his wife Annice Pih. His 28 yo mother is currently residing with them. He has two grown children. He enjoys watching TV.   Social Determinants of Health   Financial Resource Strain: Not on file  Food Insecurity: Not on file  Transportation Needs: Not on file  Physical Activity: Not on file  Stress: Not on file  Social Connections: Not on file    Activities of Daily Living In your present state of health, do you have any difficulty performing the following activities: 10/15/2020  Hearing? Y  Vision? N  Difficulty concentrating or making decisions? N  Walking or climbing stairs? Y  Dressing or bathing? N  Doing errands, shopping? N  Preparing Food and eating ? N  Using the Toilet? N  In the past six months, have you accidently leaked urine? N  Do you have problems with loss of bowel control? N  Managing your Medications? N  Managing your Finances? N  Housekeeping or managing your Housekeeping? N  Some recent data might be hidden   Patient has noticed some decrease in his hearing and does  report he needs to have a hearing exam.  Patient Education/ Literacy How often do you need to have someone help you when you read instructions, pamphlets, or other written materials from your doctor or pharmacy?: 1 - Never  Exercise Current Exercise Habits: The patient does not participate in regular exercise at present, Exercise limited by: None identified;orthopedic condition(s)  Diet Patient reports consuming 2 meals a day and 1 snack(s) a day Patient reports that his primary diet is: Regular Patient reports that he does have regular access to food.   Depression Screen PHQ 2/9 Scores 05/13/2020 11/21/2019 10/13/2019 07/23/2018 04/27/2018 02/15/2018 09/24/2017  PHQ - 2 Score 0 0 0 0 0 0 0  PHQ- 9 Score - 0 - - - - -     Fall Risk Fall Risk  05/13/2020 11/21/2019 10/13/2019 07/23/2018 04/27/2018  Falls in the past year? 0 0 0 0 No  Number falls in past yr: - - - - -  Injury with Fall? - - - - -  Risk for fall due to : - - - - -     Objective:  Seth Martinez seemed alert and oriented and he participated appropriately during our telephone visit.  Blood Pressure Weight BMI  BP Readings from Last 3 Encounters:  05/13/20 (!) 163/83  11/21/19 127/81  08/01/18 (!) 164/90   Wt Readings from Last 3 Encounters:  05/13/20 (!) 318 lb 3.2 oz (144.3 kg)  11/21/19 (!) 313 lb (142 kg)  08/01/18 (!) 312 lb 9.6 oz (141.8 kg)   BMI Readings from Last 1 Encounters:  05/13/20 44.38 kg/m    *Unable to obtain current vital signs, weight, and BMI due to telephone visit type  Hearing/Vision  . Eudell did not seem to have difficulty with hearing/understanding during the telephone conversation . Reports that he has had a formal eye exam by an eye care professional within the past year . Reports that he has not had a formal hearing evaluation within the past year *Unable to fully assess hearing and vision during telephone visit type  Cognitive Function: 6CIT Screen 10/15/2020 10/13/2019  What Year? 0  points 0 points  What month? 0 points 0 points  What time? 0 points 0 points  Count back from 20 0 points 0 points  Months in reverse 0 points 0 points  Repeat phrase 0 points 0 points  Total Score 0 0   (Normal:0-7, Significant for Dysfunction: >8)  Normal Cognitive Function Screening: Yes   Immunization & Health Maintenance Record Immunization History  Administered Date(s) Administered  . Tdap 05/11/2011    Health Maintenance  Topic Date Due  . COVID-19 Vaccine (1) Never done  . COLONOSCOPY (Pts 45-68yrs Insurance coverage will need to be confirmed)  05/29/2012  . OPHTHALMOLOGY EXAM  12/28/2016  . FOOT EXAM  02/16/2019  . COLON CANCER SCREENING ANNUAL FOBT  04/29/2019  . HEMOGLOBIN A1C  05/16/2020  . INFLUENZA VACCINE  10/17/2020 (Originally 02/18/2020)  . Hepatitis C Screening  12/28/2020 (Originally 11-02-1950)  . PNA vac Low Risk Adult (1 of 2 - PCV13) 05/13/2021 (Originally 05/09/2014)  . TETANUS/TDAP  05/10/2021  . HPV VACCINES  Aged Out       Assessment  This is a routine wellness examination for Seth Martinez.  Health Maintenance: Due or Overdue Health Maintenance Due  Topic Date Due  . COVID-19 Vaccine (1) Never done  . COLONOSCOPY (Pts 45-65yrs Insurance coverage will need to be confirmed)  05/29/2012  . OPHTHALMOLOGY EXAM  12/28/2016  . FOOT EXAM  02/16/2019  . COLON CANCER SCREENING ANNUAL FOBT  04/29/2019  . HEMOGLOBIN A1C  05/16/2020    Seth Martinez does not need a referral for Community Assistance: Care Management:   no Social Work:    no Prescription Assistance:  no Nutrition/Diabetes Education:  no   Plan:  Personalized Goals Goals Addressed            This Visit's Progress   . Patient Stated       10/15/2020 AWV Goal: Exercise for General Health   Patient will verbalize understanding of the benefits of increased physical activity:  Exercising regularly is important. It will improve your overall fitness, flexibility, and  endurance.  Regular exercise also will improve your overall health. It can help you control your weight, reduce stress, and improve your bone density.  Over the next year, patient will increase physical activity as tolerated with a goal of at least 150 minutes of moderate physical activity per week.   You can tell that you are exercising at a moderate intensity if your heart starts beating faster and you start breathing faster but can still hold a conversation.  Moderate-intensity exercise ideas include:  Walking 1 mile (1.6 km) in about 15 minutes  Biking  Hiking  Golfing  Dancing  Water aerobics  Patient will verbalize understanding of everyday activities that increase physical activity by providing examples like the following: ? Yard work, such as: ? Pushing a Surveyor, mining ? Raking and bagging leaves ? Washing your car ? Pushing a stroller ? Shoveling snow ? Gardening ? Washing windows or floors  Patient will be able to explain general safety guidelines for exercising:   Before you start a new exercise program, talk with your health care provider.  Do not exercise so much that you hurt yourself, feel dizzy, or get very short of breath.  Wear comfortable clothes and wear shoes with good support.  Drink plenty of water while you exercise to prevent dehydration or heat stroke.  Work out until your breathing and your heartbeat get faster.       Personalized Health Maintenance & Screening Recommendations  Pneumococcal vaccine  Colorectal cancer  screening  Lung Cancer Screening Recommended: no (Low Dose CT Chest recommended if Age 38-80 years, 30 pack-year currently smoking OR have quit w/in past 15 years) Hepatitis C Screening recommended: no HIV Screening recommended: no  Advanced Directives: Written information was not prepared per patient's request.  Referrals & Orders No orders of the defined types were placed in this encounter.   Follow-up  Plan . Follow-up with Seth Ip, DO as planned . Schedule colonoscopy    I have personally reviewed and noted the following in the patient's chart:   . Medical and social history . Use of alcohol, tobacco or illicit drugs  . Current medications and supplements . Functional ability and status . Nutritional status . Physical activity . Advanced directives . List of other physicians . Hospitalizations, surgeries, and ER visits in previous 12 months . Vitals . Screenings to include cognitive, depression, and falls . Referrals and appointments  In addition, I have reviewed and discussed with Seth Martinez certain preventive protocols, quality metrics, and best practice recommendations. A written personalized care plan for preventive services as well as general preventive health recommendations is available and can be mailed to the patient at his request.      Mariam Dollar, LPN    07/22/7251  Patient declined after visit summary

## 2021-03-27 ENCOUNTER — Ambulatory Visit (INDEPENDENT_AMBULATORY_CARE_PROVIDER_SITE_OTHER): Payer: Medicare Other | Admitting: Nurse Practitioner

## 2021-03-27 ENCOUNTER — Encounter: Payer: Self-pay | Admitting: Nurse Practitioner

## 2021-03-27 ENCOUNTER — Other Ambulatory Visit: Payer: Self-pay

## 2021-03-27 VITALS — BP 139/81 | HR 67 | Temp 97.6°F | Resp 20 | Ht 71.0 in | Wt 311.0 lb

## 2021-03-27 DIAGNOSIS — E8881 Metabolic syndrome: Secondary | ICD-10-CM | POA: Diagnosis not present

## 2021-03-27 DIAGNOSIS — I10 Essential (primary) hypertension: Secondary | ICD-10-CM

## 2021-03-27 DIAGNOSIS — M545 Low back pain, unspecified: Secondary | ICD-10-CM

## 2021-03-27 DIAGNOSIS — B356 Tinea cruris: Secondary | ICD-10-CM

## 2021-03-27 DIAGNOSIS — G8929 Other chronic pain: Secondary | ICD-10-CM

## 2021-03-27 DIAGNOSIS — M65331 Trigger finger, right middle finger: Secondary | ICD-10-CM

## 2021-03-27 DIAGNOSIS — E559 Vitamin D deficiency, unspecified: Secondary | ICD-10-CM

## 2021-03-27 DIAGNOSIS — E782 Mixed hyperlipidemia: Secondary | ICD-10-CM | POA: Diagnosis not present

## 2021-03-27 DIAGNOSIS — M25512 Pain in left shoulder: Secondary | ICD-10-CM | POA: Diagnosis not present

## 2021-03-27 DIAGNOSIS — R351 Nocturia: Secondary | ICD-10-CM

## 2021-03-27 DIAGNOSIS — N401 Enlarged prostate with lower urinary tract symptoms: Secondary | ICD-10-CM

## 2021-03-27 LAB — BAYER DCA HB A1C WAIVED: HB A1C (BAYER DCA - WAIVED): 5.5 % (ref 4.8–5.6)

## 2021-03-27 MED ORDER — CLOTRIMAZOLE-BETAMETHASONE 1-0.05 % EX CREA: 1.0000 "application " | TOPICAL_CREAM | Freq: Every day | CUTANEOUS | 3 refills | Status: AC

## 2021-03-27 MED ORDER — RAMIPRIL 5 MG PO CAPS: 5.0000 mg | ORAL_CAPSULE | Freq: Every day | ORAL | 1 refills | Status: DC

## 2021-03-27 NOTE — Patient Instructions (Signed)

## 2021-03-27 NOTE — Progress Notes (Signed)
Subjective:    Patient ID: Seth Martinez, male    DOB: 1950/10/23, 70 y.o.   MRN: [id number]    Chief Complaint: medical management of chronic issues     HPI:  1. Primary hypertension No c/o chest pain, sob or headache. Does not check blood pressure at home. He has not been taking his altace because he didn't want to run out. BP Readings from Last 3 Encounters:  03/27/21 139/81  05/13/20 (!) 163/83  11/21/19 127/81       2. Mixed hyperlipidemia Does not watch diet and does no dedicated exercise. Lab Results  Component Value Date   CHOL 167 11/15/2019   HDL 37 (L) 11/15/2019   LDLCALC 94 11/15/2019   TRIG 209 (H) 11/15/2019   CHOLHDL 4.5 11/15/2019     3. Metabolic syndrome Does not check blood sugars at home. Wife says that he eats a lot of sweets. Lab Results  Component Value Date   HGBA1C 5.8 11/15/2019     4. Vitamin D deficiency Is on a daily vitamin d supplement  5. Benign prostatic hyperplasia with nocturia No urinary issues. He has noyt seen urology in years. Does not like to go to the doctor. Lab Results  Component Value Date   PSA1 0.7 11/15/2019   PSA1 0.7 06/22/2018   PSA1 0.8 05/12/2017   PSA 0.4 09/28/2013      6. Chronic bilateral low back pain without sciatica Has had back surgery twice. Had a recent fall and back is a little sore.   7. Morbid obesity ([place]) Weight is down 7lbs Wt Readings from Last 3 Encounters:  03/27/21 (!) 311 lb (141.1 kg)  05/13/20 (!) 318 lb 3.2 oz (144.3 kg)  11/21/19 (!) 313 lb (142 kg)   BMI Readings from Last 3 Encounters:  03/27/21 43.38 kg/m  05/13/20 44.38 kg/m  11/21/19 43.65 kg/m       Outpatient Encounter Medications as of 03/27/2021  Medication Sig   aspirin 325 MG tablet Take 325 mg by mouth daily.   EPINEPHrine (EPI-PEN) 0.3 mg/0.3 mL SOAJ injection Inject 0.3 mLs (0.3 mg total) into the muscle once.   ramipril (ALTACE) 5 MG capsule Take 1 capsule (5 mg total) by mouth daily.    Triamcinolone Acetonide (NASACORT ALLERGY 24HR NA) Place into the nose.   No facility-administered encounter medications on file as of 03/27/2021.    Past Surgical History:  Procedure Laterality Date   BACK SURGERY  1992   lumb fusion   [place]   right-fx   fix screws in sacrum  in office  1993   fusion lt sacrum with screws  [place] ing 64 weeks old   I & D EXTREMITY Right 06/23/2013   Procedure: IRRIGATION AND DEBRIDEMENT EXTREMITY;  Surgeon: Roseanne Kaufman, MD;  Location: [place];  Service: Orthopedics;  Laterality: Right;   INGUINAL HERNIA REPAIR     bilat age 52   KNEE ARTHROSCOPY Left 05/04/2013   Procedure: ARTHROSCOPY KNEE WITH WASHOUT;  Surgeon: Renette Butters, MD;  Location: [place];  Service: Orthopedics;  Laterality: Left;   ORIF CLAVICLE FRACTURE  1956   left-age 32   ORIF FIBULA FRACTURE  1956   lt-accident age 30yr   ORIF P[place]IC FRACTURE  1956   age 89  [place] 32   rt knee arthroscopic  07/2006  TEE WITHOUT CARDIOVERSION N/A 05/04/2013   Procedure: TRANSESOPHAGEAL ECHOCARDIOGRAM (TEE);  Surgeon: Candee Furbish, MD;  [facility];  Service: Cardiovascular;  Laterality: N/A;   TENDON REPAIR Right 06/23/2013   Procedure: RIGHT INDEX FINGER AND PALM IRRIGATION AND DEBRIDEMENT FLEXOR  TENOSYNOVECTOMY BX AND REPAIR AS NECESSARY ;  Surgeon: Roseanne Kaufman, MD;  Location: [place];  Service: Orthopedics;  Laterality: Right;    Family History  Problem Relation Age of Onset   Stroke Father    Diabetes type II Brother     New complaints: Having left shoulder pain- started hurting yesterday morning with raising arm above head. Is some better today. Finger pain bil hands- he has trigger finger on both hands for over a year on right hand and 2 weeks ago on eft hand. He does not want to do anythiung about it right now. Has had itching in genitale area for over a  year- was given triamcinolone cream and that did not help at all. Has been using gold bond cream which has helped better.   Social history: Lives with wife. Retired from [employer]  Controlled substance contract: n/a    Review of Systems  Constitutional:  Negative for diaphoresis.  Eyes:  Negative for pain.  Respiratory:  Negative for shortness of breath.   Cardiovascular:  Negative for chest pain, palpitations and leg swelling.  Gastrointestinal:  Negative for abdominal pain.  Endocrine: Negative for polydipsia.  Musculoskeletal:  Positive for arthralgias and back pain (chronic).  Skin:  Negative for rash.  Neurological:  Negative for dizziness, weakness and headaches.  Hematological:  Does not bruise/bleed easily.  All other systems reviewed and are negative.     Objective:   Physical Exam Vitals and nursing note reviewed.  Constitutional:      Appearance: Normal appearance. He is well-developed.  HENT:     Head: Normocephalic.     Nose: Nose normal.  Eyes:     Pupils: Pupils are equal, round, and reactive to light.  Neck:     Thyroid: No thyroid mass or thyromegaly.     Vascular: No carotid bruit or JVD.     Trachea: Phonation normal.  Cardiovascular:     Rate and Rhythm: Normal rate and regular rhythm.  Pulmonary:     Effort: Pulmonary effort is normal. No respiratory distress.     Breath sounds: Normal breath sounds.  Abdominal:     General: Bowel sounds are normal.     Palpations: Abdomen is soft.     Tenderness: There is no abdominal tenderness.  Musculoskeletal:        General: Normal range of motion.     Cervical back: Normal range of motion and neck supple.     Comments: Rises slowly from sitting to standing. FROM of left shoulder with full abduction. Grips equal bil. No point tenderness. Right middle trigger finger Left middle trigger finger  Lymphadenopathy:     Cervical: No cervical adenopathy.  Skin:    General: Skin is warm and dry.   Neurological:     Mental Status: He is alert and oriented to person, place, and time.  Psychiatric:        Behavior: Behavior normal.        Thought Content: Thought content normal.        Judgment: Judgment normal.    BP 139/81   Pulse 67   Temp 97.6 F (36.4 C) (Temporal)   Resp 20   Ht _0  (  1.803 m)   Wt (!) 311 lb (141.1 kg)   SpO2 96%   BMI 43.38 kg/m   Hgba1c 5.5%     Assessment & Plan:   LILTON PARE comes in today with chief complaint of Medical Management of Chronic Issues   Diagnosis and orders addressed:  1. Primary hypertension Low sodium diet - CBC with Differential/Platelet - CMP14+EGFR - ramipril (ALTACE) 5 MG capsule; Take 1 capsule (5 mg total) by mouth daily.  Dispense: 90 capsule; Refill: 1  2. Mixed hyperlipidemia Low fat diet - Lipid panel  3. Metabolic syndrome Watch carbs in deit - Bayer DCA Hb A1c Waived  4. Vitamin D deficiency Continue daily vitamin d supplement - VITAMIN D 25 Hydroxy (Vit-D Deficiency, Fractures)  5. Benign prostatic hyperplasia with nocturia Report any urinary problems  6. Chronic bilateral low back pain without sciatica Moist heat rest  7. Morbid obesity ([place]) Discussed diet and exercise for person with BMI >25 Will recheck weight in 3-6 months  8. Acute pain of left shoulder Labs pending tylenol OTC as needed - Arthritis Panel  9. Trigger middle finger of right hand Will let us know when wants to see ortho  10. Jock itch Avoid scratching Keep area as dry as possible. - clotrimazole-betamethasone (LOTRISONE) cream; Apply 1 application topically daily.  Dispense: 45 g; Refill: 3   Labs pending Health Maintenance reviewed Diet and exercise encouraged  Follow up plan: 6 months   Mary-Margaret Hassell Done, FNP

## 2021-03-28 LAB — CMP14+EGFR
ALT: 30 IU/L (ref 0–44)
AST: 32 IU/L (ref 0–40)
Albumin/Globulin Ratio: 1.8 (ref 1.2–2.2)
Albumin: 4.6 g/dL (ref 3.7–4.7)
Alkaline Phosphatase: 64 IU/L (ref 44–121)
BUN/Creatinine Ratio: 13 (ref 10–24)
BUN: 16 mg/dL (ref 8–27)
Bilirubin Total: 0.5 mg/dL (ref 0.0–1.2)
CO2: 19 mmol/L — ABNORMAL LOW (ref 20–29)
Calcium: 9.5 mg/dL (ref 8.6–10.2)
Chloride: 103 mmol/L (ref 96–106)
Creatinine, Ser: 1.2 mg/dL (ref 0.76–1.27)
Globulin, Total: 2.6 g/dL (ref 1.5–4.5)
Glucose: 116 mg/dL — ABNORMAL HIGH (ref 65–99)
Potassium: 4.3 mmol/L (ref 3.5–5.2)
Sodium: 140 mmol/L (ref 134–144)
Total Protein: 7.2 g/dL (ref 6.0–8.5)
eGFR: 65 mL/min/{1.73_m2} (ref >59)

## 2021-03-28 LAB — CBC WITH DIFFERENTIAL/PLATELET
Basophils Absolute: 0.1 10*3/uL (ref 0.0–0.2)
Basos: 1 %
EOS (ABSOLUTE): 0.3 10*3/uL (ref 0.0–0.4)
Eos: 4 %
Hematocrit: 49.7 % (ref 37.5–51.0)
Hemoglobin: 16.5 g/dL (ref 13.0–17.7)
Immature Grans (Abs): 0.1 10*3/uL (ref 0.0–0.1)
Immature Granulocytes: 1 %
Lymphocytes Absolute: 1.5 10*3/uL (ref 0.7–3.1)
Lymphs: 23 %
MCH: 29.6 pg (ref 26.6–33.0)
MCHC: 33.2 g/dL (ref 31.5–35.7)
MCV: 89 fL (ref 79–97)
Monocytes Absolute: 0.6 10*3/uL (ref 0.1–0.9)
Monocytes: 9 %
Neutrophils Absolute: 4.2 10*3/uL (ref 1.4–7.0)
Neutrophils: 62 %
Platelets: 244 10*3/uL (ref 150–450)
RBC: 5.57 x10E6/uL (ref 4.14–5.80)
RDW: 13.8 % (ref 11.6–15.4)
WBC: 6.8 10*3/uL (ref 3.4–10.8)

## 2021-03-28 LAB — ARTHRITIS PANEL
Basophils Absolute: 0.1 10*3/uL (ref 0.0–0.2)
Basos: 1 %
EOS (ABSOLUTE): 0.3 10*3/uL (ref 0.0–0.4)
Eos: 4 %
Hematocrit: 49.9 % (ref 37.5–51.0)
Hemoglobin: 16.9 g/dL (ref 13.0–17.7)
Immature Grans (Abs): 0 10*3/uL (ref 0.0–0.1)
Immature Granulocytes: 0 %
Lymphocytes Absolute: 1.6 10*3/uL (ref 0.7–3.1)
Lymphs: 22 %
MCH: 30.3 pg (ref 26.6–33.0)
MCHC: 33.9 g/dL (ref 31.5–35.7)
MCV: 89 fL (ref 79–97)
Monocytes Absolute: 0.7 10*3/uL (ref 0.1–0.9)
Monocytes: 9 %
Neutrophils Absolute: 4.6 10*3/uL (ref 1.4–7.0)
Neutrophils: 64 %
Platelets: 239 10*3/uL (ref 150–450)
RBC: 5.58 x10E6/uL (ref 4.14–5.80)
RDW: 13.5 % (ref 11.6–15.4)
Rheumatoid fact SerPl-aCnc: <10 IU/mL (ref <14.0)
Sed Rate: 19 mm/hr (ref 0–30)
Uric Acid: 7.8 mg/dL (ref 3.8–8.4)
WBC: 7.3 10*3/uL (ref 3.4–10.8)

## 2021-03-28 LAB — LIPID PANEL
Chol/HDL Ratio: 4.3 ratio (ref 0.0–5.0)
Cholesterol, Total: 171 mg/dL (ref 100–199)
HDL: 40 mg/dL (ref >39)
LDL Chol Calc (NIH): 107 mg/dL — ABNORMAL HIGH (ref 0–99)
Triglycerides: 134 mg/dL (ref 0–149)
VLDL Cholesterol Cal: 24 mg/dL (ref 5–40)

## 2021-03-28 LAB — VITAMIN D 25 HYDROXY (VIT D DEFICIENCY, FRACTURES): Vit D, 25-Hydroxy: 42.3 ng/mL (ref 30.0–100.0)

## 2021-03-31 ENCOUNTER — Ambulatory Visit: Payer: Self-pay | Admitting: Nurse Practitioner

## 2021-06-03 ENCOUNTER — Ambulatory Visit (INDEPENDENT_AMBULATORY_CARE_PROVIDER_SITE_OTHER): Payer: Medicare Other | Admitting: Nurse Practitioner

## 2021-06-03 ENCOUNTER — Other Ambulatory Visit: Payer: Self-pay

## 2021-06-03 ENCOUNTER — Encounter: Payer: Self-pay | Admitting: Nurse Practitioner

## 2021-06-03 VITALS — BP 168/109 | HR 71 | Temp 98.3°F | Resp 20 | Ht 71.0 in | Wt 313.0 lb

## 2021-06-03 DIAGNOSIS — M545 Low back pain, unspecified: Secondary | ICD-10-CM

## 2021-06-03 MED ORDER — PREDNISONE 20 MG PO TABS: ORAL_TABLET | ORAL | 0 refills | Status: DC

## 2021-06-03 MED ORDER — METHYLPREDNISOLONE ACETATE 80 MG/ML IJ SUSP
80.0000 mg | Freq: Once | INTRAMUSCULAR | Status: AC
  Administered 2021-06-03: 80 mg via INTRAMUSCULAR

## 2021-06-03 NOTE — Progress Notes (Signed)
   Subjective:    Patient ID: Seth Martinez, male    DOB: 10/03/1950, 70 y.o.   MRN: [id number]   Chief Complaint: Back Pain   HPI Patient in today c/o low back pain. Started about 1 week ago. He is not sure if he injured it or not. Pain is constant in lower back. He has had back surgery in the past, and says he has 2 broken screws in there. Rates pain 1/10 currently can go as high as 3/10. Moving increases pain. Sitting still makes it better. He took 1 pain pill and that helped some, but made him dizzy.     Review of Systems  Constitutional:  Negative for diaphoresis.  Eyes:  Negative for pain.  Respiratory:  Negative for shortness of breath.   Cardiovascular:  Negative for chest pain, palpitations and leg swelling.  Gastrointestinal:  Negative for abdominal pain.  Endocrine: Negative for polydipsia.  Musculoskeletal:  Positive for back pain.  Skin:  Negative for rash.  Neurological:  Negative for dizziness, weakness and headaches.  Hematological:  Does not bruise/bleed easily.  All other systems reviewed and are negative.     Objective:   Physical Exam Vitals and nursing note reviewed.  Constitutional:      Appearance: Normal appearance.  Cardiovascular:     Rate and Rhythm: Normal rate and regular rhythm.     Heart sounds: Normal heart sounds.  Pulmonary:     Breath sounds: Normal breath sounds.  Musculoskeletal:     Comments: Decrease ROM of lumbar spine with pain on movement in any direction. (-) SLR bil Motor strength and sensation distally intact.  Skin:    General: Skin is warm.  Neurological:     General: No focal deficit present.     Mental Status: He is alert and oriented to person, place, and time.    BP (!) 168/109   Pulse 71   Temp 98.3 F (36.8 C) (Temporal)   Resp 20   Ht 5\' 11"  (1.803 m)   Wt (!) 313 lb (142 kg)   SpO2 97%   BMI 43.65 kg/m        Assessment & Plan:  in today with chief complaint of Back  Pain   1. Acute right-sided low back pain without sciatica No bending or stooping No heavy lifting Rest RTO prn if not improving - methylPREDNISolone acetate (DEPO-MEDROL) injection 80 mg - predniSONE (DELTASONE) 20 MG tablet; 2 po at sametime daily for 5 days-  Dispense: 10 tablet; Refill: 0    The above assessment and management plan was discussed with the patient. The patient verbalized understanding of and has agreed to the management plan. Patient is aware to call the clinic if symptoms persist or worsen. Patient is aware when to return to the clinic for a follow-up visit. Patient educated on when it is appropriate to go to the emergency department.   Mary-Margaret [name], FNP

## 2021-06-03 NOTE — Patient Instructions (Signed)

## 2021-06-16 ENCOUNTER — Encounter: Payer: Self-pay | Admitting: Family Medicine

## 2021-06-16 ENCOUNTER — Ambulatory Visit (INDEPENDENT_AMBULATORY_CARE_PROVIDER_SITE_OTHER): Payer: Medicare Other | Admitting: Family Medicine

## 2021-06-16 ENCOUNTER — Telehealth: Payer: Medicare Other | Admitting: Nurse Practitioner

## 2021-06-16 DIAGNOSIS — J0110 Acute frontal sinusitis, unspecified: Secondary | ICD-10-CM

## 2021-06-16 MED ORDER — AZITHROMYCIN 250 MG PO TABS: ORAL_TABLET | ORAL | 0 refills | Status: DC

## 2021-06-16 NOTE — Progress Notes (Signed)
Virtual Visit via telephone Note  I connected with Seth Martinez on 06/16/21 at 1025 by telephone and verified that I am speaking with the correct person using two identifiers. Seth Martinez is currently located at home and patient are currently with her during visit. The provider, Elige Radon Blayke Cordrey, MD is located in their office at time of visit.  Call ended at 1036  I discussed the limitations, risks, security and privacy concerns of performing an evaluation and management service by telephone and the availability of in person appointments. I also discussed with the patient that there may be a patient responsible charge related to this service. The patient expressed understanding and agreed to proceed.   History and Present Illness: Patient is calling in for cough and running nose and congestion. He has been fighting symptoms for 7 days.  He is clearing up some but still has deep cough and sinus pressure.  His cough is also worse at night. He has been taking mucinex and alka seltzer plus for sinuses.  He feels like it is deep and is in his chest and is not clearing it up.  He is taking cough syrup.  He denies fevers or chills or SOB and denies sick contacts.   1. Acute non-recurrent frontal sinusitis     Outpatient Encounter Medications as of 06/16/2021  Medication Sig   azithromycin (ZITHROMAX) 250 MG tablet Take 2 the first day and then one each day after.   aspirin 325 MG tablet Take 325 mg by mouth daily.   clotrimazole-betamethasone (LOTRISONE) cream Apply 1 application topically daily.   EPINEPHrine (EPI-PEN) 0.3 mg/0.3 mL SOAJ injection Inject 0.3 mLs (0.3 mg total) into the muscle once. (Patient not taking: No sig reported)   predniSONE (DELTASONE) 20 MG tablet 2 po at sametime daily for 5 days-   ramipril (ALTACE) 5 MG capsule Take 1 capsule (5 mg total) by mouth daily.   No facility-administered encounter medications on file as of 06/16/2021.    Review of Systems   Constitutional:  Negative for chills and fever.  HENT:  Positive for congestion, postnasal drip, rhinorrhea and sinus pressure. Negative for ear discharge, ear pain, sneezing, sore throat and voice change.   Eyes:  Negative for pain, discharge, redness and visual disturbance.  Respiratory:  Positive for cough. Negative for shortness of breath and wheezing.   Cardiovascular:  Negative for chest pain and leg swelling.  Musculoskeletal:  Negative for gait problem.  Skin:  Negative for rash.  Neurological:  Negative for dizziness, weakness and numbness.  All other systems reviewed and are negative.  Observations/Objective: Patient sounds comfortable and in no acute distress  Assessment and Plan: Problem List Items Addressed This Visit   None Visit Diagnoses     Acute non-recurrent frontal sinusitis    -  Primary   Relevant Medications   azithromycin (ZITHROMAX) 250 MG tablet      Will try azithromycin and treat like sinuses, if not improved give [place] call back  Follow up plan: Return if symptoms worsen or fail to improve.     I discussed the assessment and treatment plan with the patient. The patient was provided an opportunity to ask questions and all were answered. The patient agreed with the plan and demonstrated an understanding of the instructions.   The patient was advised to call back or seek an in-person evaluation if the symptoms worsen or if the condition fails to improve as anticipated.  The above assessment and management  plan was discussed with the patient. The patient verbalized understanding of and has agreed to the management plan. Patient is aware to call the clinic if symptoms persist or worsen. Patient is aware when to return to the clinic for a follow-up visit. Patient educated on when it is appropriate to go to the emergency department.    I provided 11 minutes of non-face-to-face time during this encounter.    Nils Pyle, MD

## 2021-09-25 ENCOUNTER — Encounter: Payer: Self-pay | Admitting: Nurse Practitioner

## 2021-09-25 ENCOUNTER — Ambulatory Visit (INDEPENDENT_AMBULATORY_CARE_PROVIDER_SITE_OTHER): Payer: Medicare Other | Admitting: Nurse Practitioner

## 2021-09-25 VITALS — BP 142/90 | HR 67 | Temp 97.2°F | Resp 20 | Ht 71.0 in | Wt 321.0 lb

## 2021-09-25 DIAGNOSIS — J3089 Other allergic rhinitis: Secondary | ICD-10-CM

## 2021-09-25 DIAGNOSIS — I10 Essential (primary) hypertension: Secondary | ICD-10-CM

## 2021-09-25 DIAGNOSIS — E8881 Metabolic syndrome: Secondary | ICD-10-CM | POA: Diagnosis not present

## 2021-09-25 DIAGNOSIS — E782 Mixed hyperlipidemia: Secondary | ICD-10-CM

## 2021-09-25 DIAGNOSIS — E559 Vitamin D deficiency, unspecified: Secondary | ICD-10-CM

## 2021-09-25 DIAGNOSIS — Z1211 Encounter for screening for malignant neoplasm of colon: Secondary | ICD-10-CM

## 2021-09-25 DIAGNOSIS — R351 Nocturia: Secondary | ICD-10-CM

## 2021-09-25 DIAGNOSIS — Z125 Encounter for screening for malignant neoplasm of prostate: Secondary | ICD-10-CM | POA: Diagnosis not present

## 2021-09-25 DIAGNOSIS — N401 Enlarged prostate with lower urinary tract symptoms: Secondary | ICD-10-CM

## 2021-09-25 LAB — BAYER DCA HB A1C WAIVED: HB A1C (BAYER DCA - WAIVED): 5.4 % (ref 4.8–5.6)

## 2021-09-25 LAB — LIPID PANEL

## 2021-09-25 MED ORDER — RAMIPRIL 5 MG PO CAPS: 5.0000 mg | ORAL_CAPSULE | Freq: Every day | ORAL | 1 refills | Status: DC

## 2021-09-25 MED ORDER — FLUTICASONE PROPIONATE 50 MCG/ACT NA SUSP: 2.0000 | Freq: Every day | NASAL | 6 refills | Status: DC

## 2021-09-25 NOTE — Progress Notes (Signed)
Subjective:    Patient ID: Seth Martinez, male    DOB: 01/28/1951, 71 y.o.   MRN: [id number]   Chief Complaint: medical management of chronic issues     HPI:  Seth Martinez is a 71 y.o. who identifies as a male who was assigned male at birth.   Social history: Lives with: lives with wife Work history: retired from [employer] in today for follow up of the following chronic medical issues:  1. Primary hypertension No c/o chest pain, sob or headache. Does not check blood pressure at home. BP Readings from Last 3 Encounters:  06/03/21 (!) 168/109  03/27/21 139/81  05/13/20 (!) 163/83     2. Mixed hyperlipidemia Does not really watch diet and does very little exercise. Lab Results  Component Value Date   CHOL 171 03/27/2021   HDL 40 03/27/2021   LDLCALC 107 (H) 03/27/2021   TRIG 134 03/27/2021   CHOLHDL 4.3 03/27/2021     3. Metabolic syndrome Does not check blood sugars at home Lab Results  Component Value Date   HGBA1C 5.5 03/27/2021     4. Screening for prostate cancer Lab Results  Component Value Date   PSA1 0.7 11/15/2019   PSA1 0.7 06/22/2018   PSA1 0.8 05/12/2017   PSA 0.4 09/28/2013      5. Benign prostatic hyperplasia with nocturia Denies any voiding problems.   6. Vitamin D deficiency Daily vitamin d supplement  7. Morbid obesity ([place]) Weight is up 8lbs Wt Readings from Last 3 Encounters:  09/25/21 (!) 321 lb (145.6 kg)  06/03/21 (!) 313 lb (142 kg)  03/27/21 (!) 311 lb (141.1 kg)      New complaints: Back of throat feels like it is slimy every morning. Gets better when he coughs up phlegm  Allergies  Allergen Reactions   Morphine And Related Anaphylaxis   Penicillins Anaphylaxis    Has patient had a PCN reaction causing immediate rash, facial/tongue/throat swelling, SOB or lightheadedness with hypotension: Yes Has patient had a PCN reaction causing severe rash involving mucus membranes or skin necrosis:  No Has patient had a PCN reaction that required hospitalization No Has patient had a PCN reaction occurring within the last 10 years: No If all of the above answers are "NO", then may proceed with Cephalosporin use.   Vancomycin Rash and Other (See Comments)    Vasculitis, Red Man's Syndrome   Amitriptyline Other (See Comments)    Unknown   Antara [Fenofibrate] Other (See Comments)    Unknown   Bextra [Valdecoxib] Other (See Comments)    Unknown   Cefdinir Other (See Comments)    Unknown   Celebrex [Celecoxib] Other (See Comments)    Unknown   Crestor [Rosuvastatin Calcium] Other (See Comments)    Unknown   Escitalopram Oxalate Other (See Comments)    Unknown   Fish Oil Other (See Comments)    Unknown   Ibuprofen Swelling   Naproxen Sodium Other (See Comments)    Unknown   Neurontin [Gabapentin] Other (See Comments)    Unknown   Skelaxin Other (See Comments)    Unknown   Ultram [Tramadol] Other (See Comments)    Unknown   Vioxx [Rofecoxib] Other (See Comments)    Unknown   Zocor [Simvastatin] Other (See Comments)    Unknown   Outpatient Encounter Medications as of 09/25/2021  Medication Sig   aspirin 325 MG tablet Take 325 mg by mouth daily.   clotrimazole-betamethasone (  LOTRISONE) cream Apply 1 application topically daily.   EPINEPHrine (EPI-PEN) 0.3 mg/0.3 mL SOAJ injection Inject 0.3 mLs (0.3 mg total) into the muscle once. (Patient not taking: No sig reported)   ramipril (ALTACE) 5 MG capsule Take 1 capsule (5 mg total) by mouth daily.   [DISCONTINUED] azithromycin (ZITHROMAX) 250 MG tablet Take 2 the first day and then one each day after.   [DISCONTINUED] predniSONE (DELTASONE) 20 MG tablet 2 po at sametime daily for 5 days-   No facility-administered encounter medications on file as of 09/25/2021.    Past Surgical History:  Procedure Laterality Date   BACK SURGERY  1992   lumb fusion   [place]   right-fx   fix screws in sacrum  in office  1993    fusion lt sacrum with screws  [place] ing 66 weeks old   I & D EXTREMITY Right 06/23/2013   Procedure: IRRIGATION AND DEBRIDEMENT EXTREMITY;  Surgeon: Roseanne Kaufman, MD;  Location: [place];  Service: Orthopedics;  Laterality: Right;   INGUINAL HERNIA REPAIR     bilat age 38   KNEE ARTHROSCOPY Left 05/04/2013   Procedure: ARTHROSCOPY KNEE WITH WASHOUT;  Surgeon: Renette Butters, MD;  Location: [place];  Service: Orthopedics;  Laterality: Left;   ORIF CLAVICLE FRACTURE  1956   left-age 1   ORIF FIBULA FRACTURE  1956   lt-accident age 39yr   ORIF PELVIC FRACTURE  1956   age 89  [place] 1   rt knee arthroscopic  07/2006   TEE WITHOUT CARDIOVERSION N/A 05/04/2013   Procedure: TRANSESOPHAGEAL ECHOCARDIOGRAM (TEE);  Surgeon: MCandee Furbish MD;  [facility];  Service: Cardiovascular;  Laterality: N/A;   TENDON REPAIR Right 06/23/2013   Procedure: RIGHT INDEX FINGER AND PALM IRRIGATION AND DEBRIDEMENT FLEXOR  TENOSYNOVECTOMY BX AND REPAIR AS NECESSARY ;  Surgeon: WRoseanne Kaufman MD;  [place] [facility]  Service: Orthopedics;  Laterality: Right;    Family History  Problem Relation Age of Onset   Stroke Father    Diabetes type II Brother       Controlled substance contract: n/a     Review of Systems  Constitutional:  Negative for diaphoresis.  Eyes:  Negative for pain.  Respiratory:  Negative for shortness of breath.   Cardiovascular:  Negative for chest pain, palpitations and leg swelling.  Gastrointestinal:  Negative for abdominal pain.  Endocrine: Negative for polydipsia.  Skin:  Negative for rash.  Neurological:  Negative for dizziness, weakness and headaches.  Hematological:  Does not bruise/bleed easily.  All other systems reviewed and are negative.     Objective:   Physical Exam Vitals and nursing note reviewed.  Constitutional:      Appearance: Normal appearance. He  is well-developed.  HENT:     Head: Normocephalic.     Nose: Nose normal.     Mouth/Throat:     Mouth: Mucous membranes are moist.     Pharynx: Oropharynx is clear.  Eyes:     Pupils: Pupils are equal, round, and reactive to light.  Neck:     Thyroid: No thyroid mass or thyromegaly.     Vascular: No carotid bruit or JVD.     Trachea: Phonation normal.  Cardiovascular:     Rate and Rhythm: Normal rate and regular rhythm.  Pulmonary:     Effort: Pulmonary effort  is normal. No respiratory distress.     Breath sounds: Normal breath sounds.  Abdominal:     General: Bowel sounds are normal.     Palpations: Abdomen is soft.     Tenderness: There is no abdominal tenderness.  Musculoskeletal:        General: Normal range of motion.     Cervical back: Normal range of motion and neck supple.  Lymphadenopathy:     Cervical: No cervical adenopathy.  Skin:    General: Skin is warm and dry.  Neurological:     Mental Status: He is alert and oriented to person, place, and time.  Psychiatric:        Behavior: Behavior normal.        Thought Content: Thought content normal.        Judgment: Judgment normal.    BP (!) 142/90    Pulse 67    Temp (!) 97.2 F (36.2 C) (Temporal)    Resp 20    Ht 5' 11" (1.803 m)    Wt (!) 321 lb (145.6 kg)    SpO2 94%    BMI 44.77 kg/m    Hgba1c 5.4%       Assessment & Plan:  Seth Martinez comes in today with chief complaint of Medical Management of Chronic Issues   Diagnosis and orders addressed:  1. Primary hypertension Low sodium diet Keep diary of blood pressure at home - CBC with Differential/Platelet - CMP14+EGFR - ramipril (ALTACE) 5 MG capsule; Take 1 capsule (5 mg total) by mouth daily.  Dispense: 90 capsule; Refill: 1  2. Mixed hyperlipidemia Low fat diet - Lipid panel  3. Metabolic syndrome Continue  to watch carbs in diet - Bayer DCA Hb A1c Waived - Microalbumin / creatinine urine ratio  4. Screening for prostate  cancer Labs pending - PSA, total and free  5. Benign prostatic hyperplasia with nocturia Labs pending  6. Vitamin D deficiency Continue daily vitamin d supplement  7. Morbid obesity ([place]) Discussed diet and exercise for person with BMI >25 Will recheck weight in 3-6 months   8. Non-seasonal allergic rhinitis, unspecified trigger Continue clartin daily - fluticasone (FLONASE) 50 MCG/ACT nasal spray; Place 2 sprays into both nostrils daily.  Dispense: 16 g; Refill: 6  Cologuard ordered Labs pending Health Maintenance reviewed Diet and exercise encouraged  Follow up plan: 6 months   [place], FNP

## 2021-09-25 NOTE — Patient Instructions (Signed)
?  Cologuard ? ?Your provider has prescribed Cologuard, an easy-to-use, noninvasive test for colon cancer screening, based on the latest advances in stool DNA science.  ? ?Here's what will happen next: ? ?1. You may receive a call or email from [employer] to confirm your mailing address and insurance information ?2. Your kit will be shipped directly to you ?3. You collect your stool sample in the privacy of your own home. Please follow the instructions that come with the kit ?4. You return the kit via [facility] shipping or pick-up, in the same box it arrived in ?5. You should receive a call with the results once they are available. If you do not receive a call, please contact our office at [phone] ? ?Insurance Coverage ? ?Cologuard is covered by Medicare and most major insurers ?Cologuard is covered by Medicare and Medicare Advantage with no co-pay or deductible for eligible patients ages 71-85. ?Nationwide, more than 94% of Cologuard patients have no out-of-pocket cost for screening. ?Based on the [place] should be covered by most private insurers with no co-pay or deductible for eligible patients (ages 32-75; at average risk for colon cancer; without symptoms). ?Currently, ~74% of Cologuard patients 45-49 have had no out-of-pocket cost for screening.  ?Many national and regional payers have begun paying for CRC screening at 53. [employer] continues to work with payers to expand coverage and access for patients ages 88-49.  ? ?Only your healthcare insurance provider can confirm how Cologuard will be covered for you. If you have questions about coverage, you can contact your insurance company directly or ask the specialists at [employer] to do that for you. A Customer Support Specialist can be reached at [phone].   ? ?Patient Support ?Screening for colon cancer is very important to your good health, so if you have any questions at all, please call [employer]'s  Customer Support Specialists at [phone]. They are available 24 hours a day, 6 days a week. An instructional video is available to view online at [email or web address]  ? ?*Information and graphics obtained from [email or web address] ? ?

## 2021-09-26 LAB — CMP14+EGFR
ALT: 36 IU/L (ref 0–44)
AST: 41 IU/L — ABNORMAL HIGH (ref 0–40)
Albumin/Globulin Ratio: 1.9 (ref 1.2–2.2)
Albumin: 4.8 g/dL — ABNORMAL HIGH (ref 3.7–4.7)
Alkaline Phosphatase: 68 IU/L (ref 44–121)
BUN/Creatinine Ratio: 13 (ref 10–24)
BUN: 16 mg/dL (ref 8–27)
Bilirubin Total: 0.8 mg/dL (ref 0.0–1.2)
CO2: 21 mmol/L (ref 20–29)
Calcium: 9.9 mg/dL (ref 8.6–10.2)
Chloride: 102 mmol/L (ref 96–106)
Creatinine, Ser: 1.28 mg/dL — ABNORMAL HIGH (ref 0.76–1.27)
Globulin, Total: 2.5 g/dL (ref 1.5–4.5)
Glucose: 101 mg/dL — ABNORMAL HIGH (ref 70–99)
Potassium: 4.7 mmol/L (ref 3.5–5.2)
Sodium: 140 mmol/L (ref 134–144)
Total Protein: 7.3 g/dL (ref 6.0–8.5)
eGFR: 59 mL/min/{1.73_m2} — ABNORMAL LOW (ref >59)

## 2021-09-26 LAB — CBC WITH DIFFERENTIAL/PLATELET
Basophils Absolute: 0.1 10*3/uL (ref 0.0–0.2)
Basos: 1 %
EOS (ABSOLUTE): 0.5 10*3/uL — ABNORMAL HIGH (ref 0.0–0.4)
Eos: 7 %
Hematocrit: 51.3 % — ABNORMAL HIGH (ref 37.5–51.0)
Hemoglobin: 17.4 g/dL (ref 13.0–17.7)
Immature Grans (Abs): 0 10*3/uL (ref 0.0–0.1)
Immature Granulocytes: 0 %
Lymphocytes Absolute: 2.4 10*3/uL (ref 0.7–3.1)
Lymphs: 34 %
MCH: 30.2 pg (ref 26.6–33.0)
MCHC: 33.9 g/dL (ref 31.5–35.7)
MCV: 89 fL (ref 79–97)
Monocytes Absolute: 0.7 10*3/uL (ref 0.1–0.9)
Monocytes: 10 %
Neutrophils Absolute: 3.4 10*3/uL (ref 1.4–7.0)
Neutrophils: 48 %
Platelets: 237 10*3/uL (ref 150–450)
RBC: 5.77 x10E6/uL (ref 4.14–5.80)
RDW: 13.3 % (ref 11.6–15.4)
WBC: 7 10*3/uL (ref 3.4–10.8)

## 2021-09-26 LAB — MICROALBUMIN / CREATININE URINE RATIO
Creatinine, Urine: 107.6 mg/dL
Microalb/Creat Ratio: 73 mg/g creat — ABNORMAL HIGH (ref 0–29)
Microalbumin, Urine: 78.9 ug/mL

## 2021-09-26 LAB — LIPID PANEL
Chol/HDL Ratio: 4.5 ratio (ref 0.0–5.0)
Cholesterol, Total: 176 mg/dL (ref 100–199)
HDL: 39 mg/dL — ABNORMAL LOW (ref >39)
LDL Chol Calc (NIH): 92 mg/dL (ref 0–99)
Triglycerides: 271 mg/dL — ABNORMAL HIGH (ref 0–149)
VLDL Cholesterol Cal: 45 mg/dL — ABNORMAL HIGH (ref 5–40)

## 2021-09-26 LAB — PSA, TOTAL AND FREE
PSA, Free Pct: 43.8 %
PSA, Free: 0.35 ng/mL
Prostate Specific Ag, Serum: 0.8 ng/mL (ref 0.0–4.0)

## 2021-12-16 ENCOUNTER — Telehealth: Payer: Self-pay | Admitting: Nurse Practitioner

## 2021-12-16 NOTE — Telephone Encounter (Signed)
Left message for patient to call back and schedule Medicare Annual Wellness Visit (AWV) to be completed by video or phone.   Last AWV: 10/15/2020  Please schedule at anytime with [facility] Nurse Health Advisor   Germaine Pomfret  45 minute appointment  Any questions, please contact me at [phone]

## 2021-12-18 ENCOUNTER — Ambulatory Visit (INDEPENDENT_AMBULATORY_CARE_PROVIDER_SITE_OTHER): Payer: Medicare Other | Admitting: Nurse Practitioner

## 2021-12-18 ENCOUNTER — Encounter: Payer: Self-pay | Admitting: Nurse Practitioner

## 2021-12-18 VITALS — BP 151/88 | HR 84 | Temp 97.4°F | Resp 20 | Ht 71.0 in | Wt 320.0 lb

## 2021-12-18 DIAGNOSIS — G8929 Other chronic pain: Secondary | ICD-10-CM

## 2021-12-18 DIAGNOSIS — M5441 Lumbago with sciatica, right side: Secondary | ICD-10-CM | POA: Diagnosis not present

## 2021-12-18 DIAGNOSIS — E039 Hypothyroidism, unspecified: Secondary | ICD-10-CM

## 2021-12-18 DIAGNOSIS — R5383 Other fatigue: Secondary | ICD-10-CM

## 2021-12-18 MED ORDER — METHYLPREDNISOLONE ACETATE 80 MG/ML IJ SUSP
80.0000 mg | Freq: Once | INTRAMUSCULAR | Status: AC
  Administered 2021-12-18: 80 mg via INTRAMUSCULAR

## 2021-12-18 MED ORDER — PREDNISONE 20 MG PO TABS: 40.0000 mg | ORAL_TABLET | Freq: Every day | ORAL | 0 refills | Status: AC

## 2021-12-18 NOTE — Progress Notes (Signed)
Subjective:    Patient ID: Seth Martinez, male    DOB: 07-03-1951, 71 y.o.   MRN: [id number]  Chief Complaint: Fall and Fatigue (Going on for quite some time/)   Fall Pertinent negatives include no abdominal pain, fever or headaches.  Patient comes in c/o fatigue and falls for the last several months. He had a fall a week ago. He tripped over a cement block , but did not hurt anything. Feels so tired and does not want to get out of bed. He has been taking 3-4 aspirin a day for pain. Says he cannot take any other meds for pain.he has a bad back and has had back surgery. He says his screws have been broke. Rates back pain 2/10. Increases with certain positions.    Review of Systems  Constitutional:  Positive for fatigue. Negative for diaphoresis and fever.  Eyes:  Negative for pain.  Respiratory:  Negative for shortness of breath.   Cardiovascular:  Negative for chest pain, palpitations and leg swelling.  Gastrointestinal:  Negative for abdominal pain.  Endocrine: Negative for polydipsia.  Skin:  Negative for rash.  Neurological:  Negative for dizziness, weakness and headaches.  Hematological:  Does not bruise/bleed easily.  All other systems reviewed and are negative.     Objective:   Physical Exam Vitals and nursing note reviewed.  Constitutional:      Appearance: Normal appearance. He is well-developed.  Neck:     Thyroid: No thyroid mass or thyromegaly.     Vascular: No carotid bruit or JVD.     Trachea: Phonation normal.  Cardiovascular:     Rate and Rhythm: Normal rate and regular rhythm.  Pulmonary:     Effort: Pulmonary effort is normal. No respiratory distress.     Breath sounds: Normal breath sounds.  Abdominal:     General: Bowel sounds are normal.     Palpations: Abdomen is soft.     Tenderness: There is no abdominal tenderness.  Musculoskeletal:        General: Normal range of motion.     Cervical back: Normal range of motion and neck supple.      Comments: (-) slr. Motor strength and sensation bil lower ext intact  Lymphadenopathy:     Cervical: No cervical adenopathy.  Skin:    General: Skin is warm and dry.  Neurological:     Mental Status: He is alert and oriented to person, place, and time.  Psychiatric:        Behavior: Behavior normal.        Thought Content: Thought content normal.        Judgment: Judgment normal.   BP (!) 151/88   Pulse 84   Temp (!) 97.4 F (36.3 C) (Temporal)   Resp 20   Ht 5\' 11"  (1.803 m)   Wt (!) 320 lb (145.2 kg)   SpO2 91%   BMI 44.63 kg/m         Assessment & Plan:   in today with chief complaint of Fall and Fatigue (Going on for quite some time/)   1. Chronic midline low back pain with right-sided sciatica Moist heat Rest Stop extra aspirin and use tylenol instead Prednisone 20mg  2 po at rhe same time daily for 5 days- start tomorrow. - methylPREDNISolone acetate (DEPO-MEDROL) injection 80 mg  2. Other fatigue Labs  pending - Anemia Profile B - Thyroid Panel With TSH    The above assessment and management plan  was discussed with the patient. The patient verbalized understanding of and has agreed to the management plan. Patient is aware to call the clinic if symptoms persist or worsen. Patient is aware when to return to the clinic for a follow-up visit. Patient educated on when it is appropriate to go to the emergency department.   Mary-Margaret Hassell Done, FNP

## 2021-12-18 NOTE — Patient Instructions (Signed)
Acute Back Pain, Adult Acute back pain is sudden and usually short-lived. It is often caused by an injury to the muscles and tissues in the back. The injury may result from: A muscle, tendon, or ligament getting overstretched or torn. Ligaments are tissues that connect bones to each other. Lifting something improperly can cause a back strain. Wear and tear (degeneration) of the spinal disks. Spinal disks are circular tissue that provide cushioning between the bones of the spine (vertebrae). Twisting motions, such as while playing sports or doing yard work. A hit to the back. Arthritis. You may have a physical exam, lab tests, and imaging tests to find the cause of your pain. Acute back pain usually goes away with rest and home care. Follow these instructions at home: Managing pain, stiffness, and swelling Take over-the-counter and prescription medicines only as told by your health care provider. Treatment may include medicines for pain and inflammation that are taken by mouth or applied to the skin, or muscle relaxants. Your health care provider may recommend applying ice during the first 24-48 hours after your pain starts. To do this: Put ice in a plastic bag. Place a towel between your skin and the bag. Leave the ice on for 20 minutes, 2-3 times a day. Remove the ice if your skin turns bright red. This is very important. If you cannot feel pain, heat, or cold, you have a greater risk of damage to the area. If directed, apply heat to the affected area as often as told by your health care provider. Use the heat source that your health care provider recommends, such as a moist heat pack or a heating pad. Place a towel between your skin and the heat source. Leave the heat on for 20-30 minutes. Remove the heat if your skin turns bright red. This is especially important if you are unable to feel pain, heat, or cold. You have a greater risk of getting burned. Activity  Do not stay in bed. Staying in  bed for more than 1-2 days can delay your recovery. Sit up and stand up straight. Avoid leaning forward when you sit or hunching over when you stand. If you work at a desk, sit close to it so you do not need to lean over. Keep your chin tucked in. Keep your neck drawn back, and keep your elbows bent at a 90-degree angle (right angle). Sit high and close to the steering wheel when you drive. Add lower back (lumbar) support to your car seat, if needed. Take short walks on even surfaces as soon as you are able. Try to increase the length of time you walk each day. Do not sit, drive, or stand in one place for more than 30 minutes at a time. Sitting or standing for long periods of time can put stress on your back. Do not drive or use heavy machinery while taking prescription pain medicine. Use proper lifting techniques. When you bend and lift, use positions that put less stress on your back: Bend your knees. Keep the load close to your body. Avoid twisting. Exercise regularly as told by your health care provider. Exercising helps your back heal faster and helps prevent back injuries by keeping muscles strong and flexible. Work with a physical therapist to make a safe exercise program, as recommended by your health care provider. Do any exercises as told by your physical therapist. Lifestyle Maintain a healthy weight. Extra weight puts stress on your back and makes it difficult to have good   posture. Avoid activities or situations that make you feel anxious or stressed. Stress and anxiety increase muscle tension and can make back pain worse. Learn ways to manage anxiety and stress, such as through exercise. General instructions Sleep on a firm mattress in a comfortable position. Try lying on your side with your knees slightly bent. If you lie on your back, put a pillow under your knees. Keep your head and neck in a straight line with your spine (neutral position) when using electronic equipment like  smartphones or pads. To do this: Raise your smartphone or pad to look at it instead of bending your head or neck to look down. Put the smartphone or pad at the level of your face while looking at the screen. Follow your treatment plan as told by your health care provider. This may include: Cognitive or behavioral therapy. Acupuncture or massage therapy. Meditation or yoga. Contact a health care provider if: You have pain that is not relieved with rest or medicine. You have increasing pain going down into your legs or buttocks. Your pain does not improve after 2 weeks. You have pain at night. You lose weight without trying. You have a fever or chills. You develop nausea or vomiting. You develop abdominal pain. Get help right away if: You develop new bowel or bladder control problems. You have unusual weakness or numbness in your arms or legs. You feel faint. These symptoms may represent a serious problem that is an emergency. Do not wait to see if the symptoms will go away. Get medical help right away. Call your local emergency services (911 in the [place] Do not drive yourself to the hospital. Summary Acute back pain is sudden and usually short-lived. Use proper lifting techniques. When you bend and lift, use positions that put less stress on your back. Take over-the-counter and prescription medicines only as told by your health care provider, and apply heat or ice as told. This information is not intended to replace advice given to you by your health care provider. Make sure you discuss any questions you have with your health care provider. Document Revised: 09/27/2020 Document Reviewed: 09/27/2020 Elsevier Patient Education  2023 [employer]  

## 2021-12-19 DIAGNOSIS — E039 Hypothyroidism, unspecified: Secondary | ICD-10-CM | POA: Insufficient documentation

## 2021-12-19 LAB — ANEMIA PROFILE B
Basophils Absolute: 0.1 10*3/uL (ref 0.0–0.2)
Basos: 1 %
EOS (ABSOLUTE): 0.5 10*3/uL — ABNORMAL HIGH (ref 0.0–0.4)
Eos: 6 %
Ferritin: 259 ng/mL (ref 30–400)
Folate: 15.8 ng/mL (ref >3.0)
Hematocrit: 47.6 % (ref 37.5–51.0)
Hemoglobin: 16.2 g/dL (ref 13.0–17.7)
Immature Grans (Abs): 0 10*3/uL (ref 0.0–0.1)
Immature Granulocytes: 1 %
Iron Saturation: 21 % (ref 15–55)
Iron: 67 ug/dL (ref 38–169)
Lymphocytes Absolute: 1.4 10*3/uL (ref 0.7–3.1)
Lymphs: 17 %
MCH: 30.4 pg (ref 26.6–33.0)
MCHC: 34 g/dL (ref 31.5–35.7)
MCV: 89 fL (ref 79–97)
Monocytes Absolute: 0.7 10*3/uL (ref 0.1–0.9)
Monocytes: 9 %
Neutrophils Absolute: 5.4 10*3/uL (ref 1.4–7.0)
Neutrophils: 66 %
Platelets: 261 10*3/uL (ref 150–450)
RBC: 5.33 x10E6/uL (ref 4.14–5.80)
RDW: 13.3 % (ref 11.6–15.4)
Retic Ct Pct: 2.1 % (ref 0.6–2.6)
Total Iron Binding Capacity: 316 ug/dL (ref 250–450)
UIBC: 249 ug/dL (ref 111–343)
Vitamin B-12: 444 pg/mL (ref 232–1245)
WBC: 8.1 10*3/uL (ref 3.4–10.8)

## 2021-12-19 LAB — THYROID PANEL WITH TSH
Free Thyroxine Index: 1.7 (ref 1.2–4.9)
T3 Uptake Ratio: 29 % (ref 24–39)
T4, Total: 5.9 ug/dL (ref 4.5–12.0)
TSH: 5.61 u[IU]/mL — ABNORMAL HIGH (ref 0.450–4.500)

## 2021-12-19 MED ORDER — LEVOTHYROXINE SODIUM 50 MCG PO TABS: 50.0000 ug | ORAL_TABLET | Freq: Every day | ORAL | 1 refills | Status: DC

## 2021-12-19 NOTE — Addendum Note (Signed)
Addended by: Bennie Pierini on: 12/19/2021 09:08 AM   Modules accepted: Orders

## 2021-12-22 ENCOUNTER — Ambulatory Visit (INDEPENDENT_AMBULATORY_CARE_PROVIDER_SITE_OTHER): Payer: Medicare Other

## 2021-12-22 VITALS — Ht 71.0 in | Wt 320.0 lb

## 2021-12-22 DIAGNOSIS — Z0000 Encounter for general adult medical examination without abnormal findings: Secondary | ICD-10-CM | POA: Diagnosis not present

## 2021-12-22 NOTE — Patient Instructions (Addendum)
[name] , Thank you for taking time to come for your Medicare Wellness Visit. I appreciate your ongoing commitment to your health goals. Please review the following plan we discussed and let me know if I can assist you in the future.   Screening recommendations/referrals: Colonoscopy: Due  Recommended yearly ophthalmology/optometry visit for glaucoma screening and checkup Recommended yearly dental visit for hygiene and checkup  Vaccinations: Influenza vaccine: Declined due to multiple allergies. Pneumococcal vaccine: Declined due to multiple allergies. Tdap vaccine: 05/11/2011 Repeat in 10 years  Shingles vaccine: Declined due to multiple allergies.   Covid-19: Declined due to multiple allergies.  Advanced directives: Advance directive discussed with you today. Even though you declined this today, please call our office should you change your mind, and we can give you the proper paperwork for you to fill out.   Conditions/risks identified: Aim for 30 minutes of exercise or brisk walking, 6-8 glasses of water, and 5 servings of fruits and vegetables each day.   Next appointment: Follow up in one year for your annual wellness visit. 2024.  Preventive Care 54 Years and Older, Male  Preventive care refers to lifestyle choices and visits with your health care provider that can promote health and wellness. What does preventive care include? A yearly physical exam. This is also called an annual well check. Dental exams once or twice a year. Routine eye exams. Ask your health care provider how often you should have your eyes checked. Personal lifestyle choices, including: Daily care of your teeth and gums. Regular physical activity. Eating a healthy diet. Avoiding tobacco and drug use. Limiting alcohol use. Practicing safe sex. Taking low doses of aspirin every day. Taking vitamin and mineral supplements as recommended by your health care provider. What happens during an annual  well check? The services and screenings done by your health care provider during your annual well check will depend on your age, overall health, lifestyle risk factors, and family history of disease. Counseling  Your health care provider may ask you questions about your: Alcohol use. Tobacco use. Drug use. Emotional well-being. Home and relationship well-being. Sexual activity. Eating habits. History of falls. Memory and ability to understand (cognition). Work and work Astronomer. Screening  You may have the following tests or measurements: Height, weight, and BMI. Blood pressure. Lipid and cholesterol levels. These may be checked every 5 years, or more frequently if you are over 89 years old. Skin check. Lung cancer screening. You may have this screening every year starting at age 69 if you have a 30-pack-year history of smoking and currently smoke or have quit within the past 15 years. Fecal occult blood test (FOBT) of the stool. You may have this test every year starting at age 62. Flexible sigmoidoscopy or colonoscopy. You may have a sigmoidoscopy every 5 years or a colonoscopy every 10 years starting at age 31. Prostate cancer screening. Recommendations will vary depending on your family history and other risks. Hepatitis C blood test. Hepatitis B blood test. Sexually transmitted disease (STD) testing. Diabetes screening. This is done by checking your blood sugar (glucose) after you have not eaten for a while (fasting). You may have this done every 1-3 years. Abdominal aortic aneurysm (AAA) screening. You may need this if you are a current or former smoker. Osteoporosis. You may be screened starting at age 32 if you are at high risk. Talk with your health care provider about your test results, treatment options, and if necessary, the need for more tests. Vaccines  Your health care provider may recommend certain vaccines, such as: Influenza vaccine. This is recommended every  year. Tetanus, diphtheria, and acellular pertussis (Tdap, Td) vaccine. You may need a Td booster every 10 years. Zoster vaccine. You may need this after age 56. Pneumococcal 13-valent conjugate (PCV13) vaccine. One dose is recommended after age 83. Pneumococcal polysaccharide (PPSV23) vaccine. One dose is recommended after age 62. Talk to your health care provider about which screenings and vaccines you need and how often you need them. This information is not intended to replace advice given to you by your health care provider. Make sure you discuss any questions you have with your health care provider. Document Released: 08/02/2015 Document Revised: 03/25/2016 Document Reviewed: 05/07/2015 Elsevier Interactive Patient Education  2017 [place] Prevention in the Home Falls can cause injuries. They can happen to people of all ages. There are many things you can do to make your home safe and to help prevent falls. What can I do on the outside of my home? Regularly fix the edges of walkways and driveways and fix any cracks. Remove anything that might make you trip as you walk through a door, such as a raised step or threshold. Trim any bushes or trees on the path to your home. Use bright outdoor lighting. Clear any walking paths of anything that might make someone trip, such as rocks or tools. Regularly check to see if handrails are loose or broken. Make sure that both sides of any steps have handrails. Any raised decks and porches should have guardrails on the edges. Have any leaves, snow, or ice cleared regularly. Use sand or salt on walking paths during winter. Clean up any spills in your garage right away. This includes oil or grease spills. What can I do in the bathroom? Use night lights. Install grab bars by the toilet and in the tub and shower. Do not use towel bars as grab bars. Use non-skid mats or decals in the tub or shower. If you need to sit down in the shower, use a  plastic, non-slip stool. Keep the floor dry. Clean up any water that spills on the floor as soon as it happens. Remove soap buildup in the tub or shower regularly. Attach bath mats securely with double-sided non-slip rug tape. Do not have throw rugs and other things on the floor that can make you trip. What can I do in the bedroom? Use night lights. Make sure that you have a light by your bed that is easy to reach. Do not use any sheets or blankets that are too big for your bed. They should not hang down onto the floor. Have a firm chair that has side arms. You can use this for support while you get dressed. Do not have throw rugs and other things on the floor that can make you trip. What can I do in the kitchen? Clean up any spills right away. Avoid walking on wet floors. Keep items that you use a lot in easy-to-reach places. If you need to reach something above you, use a strong step stool that has a grab bar. Keep electrical cords out of the way. Do not use floor polish or wax that makes floors slippery. If you must use wax, use non-skid floor wax. Do not have throw rugs and other things on the floor that can make you trip. What can I do with my stairs? Do not leave any items on the stairs. Make sure that there are  handrails on both sides of the stairs and use them. Fix handrails that are broken or loose. Make sure that handrails are as long as the stairways. Check any carpeting to make sure that it is firmly attached to the stairs. Fix any carpet that is loose or worn. Avoid having throw rugs at the top or bottom of the stairs. If you do have throw rugs, attach them to the floor with carpet tape. Make sure that you have a light switch at the top of the stairs and the bottom of the stairs. If you do not have them, ask someone to add them for you. What else can I do to help prevent falls? Wear shoes that: Do not have high heels. Have rubber bottoms. Are comfortable and fit you  well. Are closed at the toe. Do not wear sandals. If you use a stepladder: Make sure that it is fully opened. Do not climb a closed stepladder. Make sure that both sides of the stepladder are locked into place. Ask someone to hold it for you, if possible. Clearly mark and make sure that you can see: Any grab bars or handrails. First and last steps. Where the edge of each step is. Use tools that help you move around (mobility aids) if they are needed. These include: Canes. Walkers. Scooters. Crutches. Turn on the lights when you go into a dark area. Replace any light bulbs as soon as they burn out. Set up your furniture so you have a clear path. Avoid moving your furniture around. If any of your floors are uneven, fix them. If there are any pets around you, be aware of where they are. Review your medicines with your doctor. Some medicines can make you feel dizzy. This can increase your chance of falling. Ask your doctor what other things that you can do to help prevent falls. This information is not intended to replace advice given to you by your health care provider. Make sure you discuss any questions you have with your health care provider. Document Released: 05/02/2009 Document Revised: 12/12/2015 Document Reviewed: 08/10/2014 Elsevier Interactive Patient Education  2017 [employer].

## 2021-12-22 NOTE — Progress Notes (Signed)
Subjective:   Seth Martinez is a 71 y.o. male who presents for Medicare Annual/Subsequent preventive examination. Virtual Visit via Telephone Note  I connected with  Seth Martinez on 12/22/21 at  9:45 AM EDT by telephone and verified that I am speaking with the correct person using two identifiers.  Location: Patient: HOME Provider: [facility] Persons participating in the virtual visit: patient/Nurse Health Advisor   I discussed the limitations, risks, security and privacy concerns of performing an evaluation and management service by telephone and the availability of in person appointments. The patient expressed understanding and agreed to proceed.  Interactive audio and video telecommunications were attempted between this nurse and patient, however failed, due to patient having technical difficulties OR patient did not have access to video capability.  We continued and completed visit with audio only.  Some vital signs may be absent or patient reported.   Chriss Driver, LPN  Review of Systems     Cardiac Risk Factors include: advanced age (>10mn, >>53women);hypertension;dyslipidemia;male gender;sedentary lifestyle;obesity (BMI >30kg/m2)     Objective:    Today's Vitals   12/22/21 0950 12/22/21 0951  Weight: (!) 320 lb (145.2 kg)   Height: '5\' 11"'  (1.803 m)   PainSc:  5    Body mass index is 44.63 kg/m.     12/22/2021   10:09 AM 10/15/2020    3:04 PM 10/13/2019    2:31 PM 04/27/2018   11:01 AM 06/22/2013   10:30 AM 05/16/2013   12:43 AM 05/02/2013    4:00 AM  Advanced Directives  Does Patient Have a Medical Advance Directive? No No No No Patient does not have advance directive;Patient would not like information Patient does not have advance directive Patient does not have advance directive  Would patient like information on creating a medical advance directive? No - Patient declined No - Patient declined No - Patient declined Yes (MAU/Ambulatory/Procedural Areas  - Information given)     Pre-existing out of facility DNR order (yellow form or pink MOST form)      No     Current Medications (verified) Outpatient Encounter Medications as of 12/22/2021  Medication Sig   aspirin 325 MG tablet Take 325 mg by mouth daily.   clotrimazole-betamethasone (LOTRISONE) cream Apply 1 application topically daily.   EPINEPHrine (EPI-PEN) 0.3 mg/0.3 mL SOAJ injection Inject 0.3 mLs (0.3 mg total) into the muscle once.   fluticasone (FLONASE) 50 MCG/ACT nasal spray Place 2 sprays into both nostrils daily.   levothyroxine (SYNTHROID) 50 MCG tablet Take 1 tablet (50 mcg total) by mouth daily.   predniSONE (DELTASONE) 20 MG tablet Take 2 tablets (40 mg total) by mouth daily with breakfast for 5 days. 2 po daily for 5 days   ramipril (ALTACE) 5 MG capsule Take 1 capsule (5 mg total) by mouth daily.   No facility-administered encounter medications on file as of 12/22/2021.    Allergies (verified) Morphine and related, Penicillins, Vancomycin, Amitriptyline, Antara [fenofibrate], Bextra [valdecoxib], Cefdinir, Celebrex [celecoxib], Crestor [rosuvastatin calcium], Escitalopram oxalate, Fish oil, Ibuprofen, Naproxen sodium, Neurontin [gabapentin], Skelaxin, Ultram [tramadol], Vioxx [rofecoxib], and Zocor [simvastatin]   History: Past Medical History:  Diagnosis Date   Chronic back pain    Depression    Fatigue    Hyperlipidemia    Hypertension    Insomnia    Joint pain    Meniscus tear    bilateral knees   Metabolic syndrome    Obesity    Septic arthritis ([place] October/2014  Vertigo    Past Surgical History:  Procedure Laterality Date   BACK SURGERY  1992   lumb fusion   [place]   right-fx   fix screws in sacrum  in office  1993   fusion lt sacrum with screws  [place] ing 18 weeks old   I & D EXTREMITY Right 06/23/2013   Procedure: IRRIGATION AND DEBRIDEMENT EXTREMITY;  Surgeon: Roseanne Kaufman, MD;  Location: [place];  Service: Orthopedics;  Laterality: Right;   INGUINAL HERNIA REPAIR     bilat age 10   KNEE ARTHROSCOPY Left 05/04/2013   Procedure: ARTHROSCOPY KNEE WITH WASHOUT;  Surgeon: Renette Butters, MD;  Location: [place];  Service: Orthopedics;  Laterality: Left;   ORIF CLAVICLE FRACTURE  1956   left-age 89   ORIF FIBULA FRACTURE  1956   lt-accident age 33yr   ORIF PELVIC FRACTURE  1956   age [id number]  [place] 89   rt knee arthroscopic  07/2006   TEE WITHOUT CARDIOVERSION N/A 05/04/2013   Procedure: TRANSESOPHAGEAL ECHOCARDIOGRAM (TEE);  Surgeon: MCandee Furbish MD;  [facility];  Service: Cardiovascular;  Laterality: N/A;   TENDON REPAIR Right 06/23/2013   Procedure: RIGHT INDEX FINGER AND PALM IRRIGATION AND DEBRIDEMENT FLEXOR  TENOSYNOVECTOMY BX AND REPAIR AS NECESSARY ;  Surgeon: WRoseanne Kaufman MD;  Location: [facility]  Service: Orthopedics;  Laterality: Right;   Family History  Problem Relation Age of Onset   Stroke Father    Diabetes type II Brother    Social History   Socioeconomic History   Marital status: Married    Spouse name: JKennyth Lose  Number of children: 2   Years of education: 12   Highest education level: 12th grade  Occupational History   Occupation: retired  Tobacco Use   Smoking status: Never   Smokeless tobacco: Never  Vaping Use   Vaping Use: Never used  Substance and Sexual Activity   Alcohol use: No    Comment: rare   Drug use: No   Sexual activity: Not on file  Other Topics Concern   Not on file  Social History Narrative   ** Merged History Encounter **       WSherylis retired and lives at home with his wife JKennyth Lose His 993yo mother is currently residing with them. He has two grown children. He enjoys watching TV.   Social Determinants of Health   Financial Resource Strain: Low Risk    Difficulty of Paying Living Expenses: Not hard at all  Food Insecurity: No Food Insecurity    Worried About RCharity fundraiserin the Last Year: Never true   [place] the Last Year: Never true  Transportation Needs: No Transportation Needs   Lack of Transportation (Medical): No   Lack of Transportation (Non-Medical): No  Physical Activity: Insufficiently Active   Days of Exercise per Week: 3 days   Minutes of Exercise per Session: 30 min  Stress: No Stress Concern Present   Feeling of Stress : Not at all  Social Connections: Socially Integrated   Frequency of Communication with Friends and Family: More than three times a week   Frequency of Social Gatherings with Friends and Family: More than three times a week   Attends Religious Services: 1 to 4 times per year   Active Member  of Clubs or Organizations: Yes   Attends Archivist Meetings: 1 to 4 times per year   Marital Status: Married    Tobacco Counseling Counseling given: Not Answered   Clinical Intake:  Pre-visit preparation completed: Yes  Pain : 0-10 Pain Score: 5  Pain Type: Chronic pain Pain Location: Back Pain Descriptors / Indicators: Aching, Dull Pain Onset: More than a month ago Pain Frequency: Intermittent     BMI - recorded: 44.63 Nutritional Status: BMI > 30  Obese Nutritional Risks: None Diabetes: No  How often do you need to have someone help you when you read instructions, pamphlets, or other written materials from your doctor or pharmacy?: 1 - Never  Diabetic?NO  Interpreter Needed?: No  Information entered by :: mj Uri Covey, lpn   Activities of Daily Living    12/22/2021   10:09 AM  In your present state of health, do you have any difficulty performing the following activities:  Hearing? 1  Comment some hearing issues.  Vision? 0  Difficulty concentrating or making decisions? 0  Walking or climbing stairs? 0  Dressing or bathing? 0  Doing errands, shopping? 0  Preparing Food and eating ? N  Using the Toilet? N  In the past six months, have you accidently  leaked urine? N  Do you have problems with loss of bowel control? N  Managing your Medications? N  Managing your Finances? N  Housekeeping or managing your Housekeeping? N    Patient Care Team: Chevis Pretty, FNP as PCP - General (Family Medicine) Kristeen Miss, MD as Consulting Physician (Neurosurgery)  Indicate any recent Medical Services you may have received from other than [facility] providers in the past year (date may be approximate).     Assessment:   This is a routine wellness examination for Seth Martinez.  Hearing/Vision screen Hearing Screening - Comments:: Some hearing issues.   Vision Screening - Comments:: Glasses. [facility]. 2022.  Dietary issues and exercise activities discussed: Current Exercise Habits: Home exercise routine, Type of exercise: Other - see comments (pool exercises.), Time (Minutes): 30, Frequency (Times/Week): 3, Weekly Exercise (Minutes/Week): 90, Intensity: Mild, Exercise limited by: cardiac condition(s)   Goals Addressed   None    Depression Screen    12/22/2021    9:58 AM 09/25/2021   10:51 AM 03/27/2021   11:26 AM 05/13/2020    9:29 AM 11/21/2019    4:22 PM 10/13/2019    2:31 PM 07/23/2018   10:32 AM  PHQ 2/9 Scores  PHQ - 2 Score 0 1 2 0 0 0 0  PHQ- 9 Score  7 5  0      Fall Risk    12/22/2021   10:06 AM 12/18/2021    9:47 AM 09/25/2021   10:51 AM 03/27/2021   11:26 AM 05/13/2020    9:29 AM  Fall Risk   Falls in the past year? 1 1 0 1 0  Number falls in past yr: 0 0  1   Injury with Fall? 0 0  0   Risk for fall due to : Impaired balance/gait;History of fall(s) History of fall(s)  History of fall(s)   Follow up Falls prevention discussed Education provided  Education provided     FALL RISK PREVENTION PERTAINING TO THE HOME:  Any stairs in or around the home? Yes  If so, are there any without handrails? No  Home free of loose throw rugs in walkways, pet beds, electrical cords, etc? No  Adequate  lighting in your home to  reduce risk of falls? No   ASSISTIVE DEVICES UTILIZED TO PREVENT FALLS:  Life alert? No  Use of a cane, walker or w/c? No  Grab bars in the bathroom? No  Shower chair or bench in shower? Yes  Elevated toilet seat or a handicapped toilet? Yes   TIMED UP AND GO:  Was the test performed? No .  Phone visit.  Cognitive Function:    04/27/2018   11:30 AM 06/10/2015   12:19 PM  MMSE - Mini Mental State Exam  Orientation to time 5 5  Orientation to Place 5 5  Registration 3 3  Attention/ Calculation 5 5  Recall 2 3  Language- name 2 objects 2 2  Language- repeat 1 1  Language- follow 3 step command 3 3  Language- read & follow direction 1 1  Write a sentence 1 1  Copy design 1 1  Total score 29 30        12/22/2021   10:10 AM 10/15/2020    3:07 PM 10/13/2019    2:37 PM  6CIT Screen  What Year? 0 points 0 points 0 points  What month? 0 points 0 points 0 points  What time? 0 points 0 points 0 points  Count back from 20 0 points 0 points 0 points  Months in reverse 0 points 0 points 0 points  Repeat phrase 0 points 0 points 0 points  Total Score 0 points 0 points 0 points    Immunizations Immunization History  Administered Date(s) Administered   Tdap 05/11/2011    TDAP status: Due, Education has been provided regarding the importance of this vaccine. Advised may receive this vaccine at local pharmacy or Health Dept. Aware to provide a copy of the vaccination record if obtained from local pharmacy or Health Dept. Verbalized acceptance and understanding.  Flu Vaccine status: Declined, Education has been provided regarding the importance of this vaccine but patient still declined. Advised may receive this vaccine at local pharmacy or Health Dept. Aware to provide a copy of the vaccination record if obtained from local pharmacy or Health Dept. Verbalized acceptance and understanding.  Pneumococcal vaccine status: Declined,  Education has been provided regarding the importance  of this vaccine but patient still declined. Advised may receive this vaccine at local pharmacy or Health Dept. Aware to provide a copy of the vaccination record if obtained from local pharmacy or Health Dept. Verbalized acceptance and understanding.   Covid-19 vaccine status: Declined, Education has been provided regarding the importance of this vaccine but patient still declined. Advised may receive this vaccine at local pharmacy or Health Dept.or vaccine clinic. Aware to provide a copy of the vaccination record if obtained from local pharmacy or Health Dept. Verbalized acceptance and understanding.  Qualifies for Shingles Vaccine? Yes   Zostavax completed No   Shingrix Completed?: No.    Education has been provided regarding the importance of this vaccine. Patient has been advised to call insurance company to determine out of pocket expense if they have not yet received this vaccine. Advised may also receive vaccine at local pharmacy or Health Dept. Verbalized acceptance and understanding.  Screening Tests Health Maintenance  Topic Date Due   Zoster Vaccines- Shingrix (1 of 2) 12/26/2021 (Originally 05/10/1999)   COVID-19 Vaccine (1) 01/07/2022 (Originally 11/07/1951)   OPHTHALMOLOGY EXAM  07/17/2022 (Originally 12/28/2016)   Fecal DNA (Cologuard)  07/17/2022 (Originally 05/09/1994)   Pneumonia Vaccine 13+ Years old (1 - PCV) 09/26/2022 (Originally  05/09/2014)   TETANUS/TDAP  09/26/2022 (Originally 05/10/2021)   Hepatitis C Screening  09/26/2022 (Originally 05/10/1969)   INFLUENZA VACCINE  02/17/2022   HEMOGLOBIN A1C  03/28/2022   FOOT EXAM  09/26/2022   HPV VACCINES  Aged Out   COLONOSCOPY (Pts 45-60yr Insurance coverage will need to be confirmed)  Discontinued    Health Maintenance  There are no preventive care reminders to display for this patient.   Colorectal cancer screening: Type of screening: Colonoscopy. Completed 05/29/2002. Repeat every 10 years  Lung Cancer Screening:  (Low Dose CT Chest recommended if Age 667-80years, 30 pack-year currently smoking OR have quit w/in 15years.) does not qualify.   Additional Screening:  Hepatitis C Screening: does qualify; Completed due  Vision Screening: Recommended annual ophthalmology exams for early detection of glaucoma and other disorders of the eye. Is the patient up to date with their annual eye exam?  Yes  Who is the provider or what is the name of the office in which the patient attends annual eye exams? [facility] If pt is not established with a provider, would they like to be referred to a provider to establish care? No .   Dental Screening: Recommended annual dental exams for proper oral hygiene  Community Resource Referral / Chronic Care Management: CRR required this visit?  No   CCM required this visit?  No      Plan:     I have personally reviewed and noted the following in the patient's chart:   Medical and social history Use of alcohol, tobacco or illicit drugs  Current medications and supplements including opioid prescriptions. Patient is currently taking opioid prescriptions. Information provided to patient regarding non-opioid alternatives. Patient advised to discuss non-opioid treatment plan with their provider. Functional ability and status Nutritional status Physical activity Advanced directives List of other physicians Hospitalizations, surgeries, and ER visits in previous 12 months Vitals Screenings to include cognitive, depression, and falls Referrals and appointments  In addition, I have reviewed and discussed with patient certain preventive protocols, quality metrics, and best practice recommendations. A written personalized care plan for preventive services as well as general preventive health recommendations were provided to patient.     MChriss Driver LPN   [id number]  Nurse Notes: Discussed doing Cologuard kit, that pt has. Pt is agreeable and states  he will do kit and send it in.

## 2022-03-30 ENCOUNTER — Encounter: Payer: Self-pay | Admitting: Nurse Practitioner

## 2022-03-30 ENCOUNTER — Ambulatory Visit (INDEPENDENT_AMBULATORY_CARE_PROVIDER_SITE_OTHER): Payer: Medicare Other | Admitting: Nurse Practitioner

## 2022-03-30 VITALS — BP 142/76 | HR 73 | Temp 97.8°F | Resp 20 | Ht 71.0 in | Wt 319.0 lb

## 2022-03-30 DIAGNOSIS — N401 Enlarged prostate with lower urinary tract symptoms: Secondary | ICD-10-CM

## 2022-03-30 DIAGNOSIS — E039 Hypothyroidism, unspecified: Secondary | ICD-10-CM | POA: Diagnosis not present

## 2022-03-30 DIAGNOSIS — I10 Essential (primary) hypertension: Secondary | ICD-10-CM

## 2022-03-30 DIAGNOSIS — E8881 Metabolic syndrome: Secondary | ICD-10-CM

## 2022-03-30 DIAGNOSIS — E559 Vitamin D deficiency, unspecified: Secondary | ICD-10-CM

## 2022-03-30 DIAGNOSIS — E782 Mixed hyperlipidemia: Secondary | ICD-10-CM

## 2022-03-30 DIAGNOSIS — R351 Nocturia: Secondary | ICD-10-CM

## 2022-03-30 LAB — BAYER DCA HB A1C WAIVED: HB A1C (BAYER DCA - WAIVED): 5.5 % (ref 4.8–5.6)

## 2022-03-30 NOTE — Progress Notes (Signed)
Subjective:    Patient ID: Seth Martinez, male    DOB: [birth year], 71 y.o.   MRN: [id number]   Chief Complaint: Medical Management of Chronic Issues    HPI:  Seth Martinez is a 71 y.o. who identifies as a male who was assigned male at birth.   Social history: Lives with: wife and children Work history: retired   Scientist, forensic in today for follow up of the following chronic medical issues:  1. Acquired hypothyroidism No problems that he is aware of. Started on levothyroxin at last labs  Lab Results  Component Value Date   TSH 5.610 (H) 12/18/2021     2. Primary hypertension No c/o chest pain, sob or headache. Does not check blood pressure at home. BP Readings from Last 3 Encounters:  03/30/22 (!) 148/85  12/18/21 (!) 151/88  09/25/21 (!) 142/90     3. Mixed hyperlipidemia Doe snot watch diet very closely and does no dedicated exercise. Cannot tolerate statins. Lab Results  Component Value Date   CHOL 176 09/25/2021   HDL 39 (L) 09/25/2021   LDLCALC 92 09/25/2021   TRIG 271 (H) 09/25/2021   CHOLHDL 4.5 09/25/2021   The 10-year ASCVD risk score (Arnett DK, et al., 2019) is: 50.9%   4. Metabolic syndrome Does not check blood sugars at home. Lab Results  Component Value Date   HGBA1C 5.4 09/25/2021     5. Benign prostatic hyperplasia with nocturia No voiding issues. Is on no meds and has not seen urology recently Lab Results  Component Value Date   PSA1 0.8 09/25/2021   PSA1 0.7 11/15/2019   PSA1 0.7 06/22/2018   PSA 0.4 09/28/2013      6. Vitamin D deficiency Is on daily vitamin d supplement Last vitamin D Lab Results  Component Value Date   VD25OH 42.3 03/27/2021     7. Morbid obesity ([place]) No recent weight changes Wt Readings from Last 3 Encounters:  03/30/22 (!) 319 lb (144.7 kg)  12/22/21 (!) 320 lb (145.2 kg)  12/18/21 (!) 320 lb (145.2 kg)   BMI Readings from Last 3 Encounters:  03/30/22 44.49 kg/m  12/22/21 44.63 kg/m   12/18/21 44.63 kg/m      New complaints: None today  Allergies  Allergen Reactions   Morphine And Related Anaphylaxis   Penicillins Anaphylaxis    Has patient had a PCN reaction causing immediate rash, facial/tongue/throat swelling, SOB or lightheadedness with hypotension: Yes Has patient had a PCN reaction causing severe rash involving mucus membranes or skin necrosis: No Has patient had a PCN reaction that required hospitalization No Has patient had a PCN reaction occurring within the last 10 years: No If all of the above answers are "NO", then may proceed with Cephalosporin use.   Vancomycin Rash and Other (See Comments)    Vasculitis, Red Man's Syndrome   Amitriptyline Other (See Comments)    Unknown   Antara [Fenofibrate] Other (See Comments)    Unknown   Bextra [Valdecoxib] Other (See Comments)    Unknown   Cefdinir Other (See Comments)    Unknown   Celebrex [Celecoxib] Other (See Comments)    Unknown   Crestor [Rosuvastatin Calcium] Other (See Comments)    Unknown   Escitalopram Oxalate Other (See Comments)    Unknown   Fish Oil Other (See Comments)    Unknown   Ibuprofen Swelling   Naproxen Sodium Other (See Comments)    Unknown   Neurontin [Gabapentin] Other (See Comments)  Unknown   Skelaxin Other (See Comments)    Unknown   Ultram [Tramadol] Other (See Comments)    Unknown   Vioxx [Rofecoxib] Other (See Comments)    Unknown   Zocor [Simvastatin] Other (See Comments)    Unknown   Outpatient Encounter Medications as of 03/30/2022  Medication Sig   aspirin 325 MG tablet Take 325 mg by mouth daily.   clotrimazole-betamethasone (LOTRISONE) cream Apply 1 application topically daily.   EPINEPHrine (EPI-PEN) 0.3 mg/0.3 mL SOAJ injection Inject 0.3 mLs (0.3 mg total) into the muscle once.   fluticasone (FLONASE) 50 MCG/ACT nasal spray Place 2 sprays into both nostrils daily.   levothyroxine (SYNTHROID) 50 MCG tablet Take 1 tablet (50 mcg total) by mouth  daily.   ramipril (ALTACE) 5 MG capsule Take 1 capsule (5 mg total) by mouth daily.   No facility-administered encounter medications on file as of 03/30/2022.    Past Surgical History:  Procedure Laterality Date   BACK SURGERY  1992   lumb fusion   ELBOW SURGERY  1965   right-fx   fix screws in sacrum  in office  1993   fusion lt sacrum with screws  1993   HERNIA REPAIR     bilat ing 8 weeks old   I & D EXTREMITY Right 06/23/2013   Procedure: IRRIGATION AND DEBRIDEMENT EXTREMITY;  Surgeon: Rashawn Gramig, MD;  Location: [facility];  Service: Orthopedics;  Laterality: Right;   INGUINAL HERNIA REPAIR     bilat age 6   KNEE ARTHROSCOPY Left 05/04/2013   Procedure: ARTHROSCOPY KNEE WITH WASHOUT;  Surgeon: Timothy D Murphy, MD;  [facility];  Service: Orthopedics;  Laterality: Left;   ORIF CLAVICLE FRACTURE  1956   left-age 4   ORIF FIBULA FRACTURE  1956   lt-accident age 4yr-   ORIF PELVIC FRACTURE  1956   age 4   ORIF RADIUS & ULNA FRACTURES  1956   left-age 4   rt knee arthroscopic  07/2006   TEE WITHOUT CARDIOVERSION N/A 05/04/2013   Procedure: TRANSESOPHAGEAL ECHOCARDIOGRAM (TEE);  Surgeon: Mark Skains, MD;  Location[facility];  Service: Cardiovascular;  Laterality: N/A;   TENDON REPAIR Right 06/23/2013   Procedure: RIGHT INDEX FINGER AND PALM IRRIGATION AND DEBRIDEMENT FLEXOR  TENOSYNOVECTOMY BX AND REPAIR AS NECESSARY ;  Surgeon: Tucker Gramig, MD;  Location: [facility];  Service: Orthopedics;  Laterality: Right;    Family History  Problem Relation Age of Onset   Stroke Father    Diabetes type II Brother       Controlled substance contract: n/a     Review of Systems  Constitutional:  Negative for diaphoresis.  Eyes:  Negative for pain.  Respiratory:  Negative for shortness of breath.   Cardiovascular:  Negative for chest pain, palpitations and leg swelling.  Gastrointestinal:  Negative for abdominal pain.  Endocrine:  Negative for polydipsia.  Skin:  Negative for rash.  Neurological:  Negative for dizziness, weakness and headaches.  Hematological:  Does not bruise/bleed easily.  All other systems reviewed and are negative.      Objective:   Physical Exam Vitals and nursing note reviewed.  Constitutional:      Appearance: Normal appearance. He is well-developed.  HENT:     Head: Normocephalic.     Nose: Nose normal.     Mouth/Throat:     Mouth: Mucous membranes are moist.     Pharynx: Oropharynx is clear.  Eyes:       Pupils: Pupils are equal, round, and reactive to light.  Neck:     Thyroid: No thyroid mass or thyromegaly.     Vascular: No carotid bruit or JVD.     Trachea: Phonation normal.  Cardiovascular:     Rate and Rhythm: Normal rate and regular rhythm.  Pulmonary:     Effort: Pulmonary effort is normal. No respiratory distress.     Breath sounds: Normal breath sounds.  Abdominal:     General: Bowel sounds are normal.     Palpations: Abdomen is soft.     Tenderness: There is no abdominal tenderness.  Musculoskeletal:        General: Normal range of motion.     Cervical back: Normal range of motion and neck supple.  Lymphadenopathy:     Cervical: No cervical adenopathy.  Skin:    General: Skin is warm and dry.  Neurological:     Mental Status: He is alert and oriented to person, place, and time.  Psychiatric:        Behavior: Behavior normal.        Thought Content: Thought content normal.        Judgment: Judgment normal.     BP (!) 142/76   Pulse 73   Temp 97.8 F (36.6 C) (Temporal)   Resp 20   Ht 5' 11" (1.803 m)   Wt (!) 319 lb (144.7 kg)   SpO2 93%   BMI 44.49 kg/m         Assessment & Plan:   KOREE SCHOPF comes in today with chief complaint of Medical Management of Chronic Issues   Diagnosis and orders addressed:  1. Acquired hypothyroidism Labs pending - Thyroid Panel With TSH  2. Primary hypertension Low sodium diet - CBC with  Differential/Platelet - CMP14+EGFR  3. Mixed hyperlipidemia Low fat diet - Lipid panel  4. Metabolic syndrome Watch carbs in diet - Bayer DCA Hb A1c Waived  5. Benign prostatic hyperplasia with nocturia Report any voiding issues  6. Vitamin D deficiency Continue daily vitamin d supplement  7. Morbid obesity ([place]) Discussed diet and exercise for person with BMI >25 Will recheck weight in 3-6 months    Labs pending Health Maintenance reviewed Diet and exercise encouraged  Follow up plan: 6 months   Mary-Margaret Hassell Done, FNP

## 2022-03-30 NOTE — Patient Instructions (Signed)

## 2022-03-31 LAB — LIPID PANEL
Chol/HDL Ratio: 4.7 ratio (ref 0.0–5.0)
Cholesterol, Total: 175 mg/dL (ref 100–199)
HDL: 37 mg/dL — ABNORMAL LOW (ref >39)
LDL Chol Calc (NIH): 98 mg/dL (ref 0–99)
Triglycerides: 233 mg/dL — ABNORMAL HIGH (ref 0–149)
VLDL Cholesterol Cal: 40 mg/dL (ref 5–40)

## 2022-03-31 LAB — CBC WITH DIFFERENTIAL/PLATELET
Basophils Absolute: 0.1 10*3/uL (ref 0.0–0.2)
Basos: 1 %
EOS (ABSOLUTE): 0.3 10*3/uL (ref 0.0–0.4)
Eos: 6 %
Hematocrit: 46.6 % (ref 37.5–51.0)
Hemoglobin: 15.8 g/dL (ref 13.0–17.7)
Immature Grans (Abs): 0 10*3/uL (ref 0.0–0.1)
Immature Granulocytes: 0 %
Lymphocytes Absolute: 1.7 10*3/uL (ref 0.7–3.1)
Lymphs: 31 %
MCH: 30.3 pg (ref 26.6–33.0)
MCHC: 33.9 g/dL (ref 31.5–35.7)
MCV: 89 fL (ref 79–97)
Monocytes Absolute: 0.6 10*3/uL (ref 0.1–0.9)
Monocytes: 11 %
Neutrophils Absolute: 2.8 10*3/uL (ref 1.4–7.0)
Neutrophils: 51 %
Platelets: 219 10*3/uL (ref 150–450)
RBC: 5.21 x10E6/uL (ref 4.14–5.80)
RDW: 13.5 % (ref 11.6–15.4)
WBC: 5.5 10*3/uL (ref 3.4–10.8)

## 2022-03-31 LAB — CMP14+EGFR
ALT: 28 IU/L (ref 0–44)
AST: 28 IU/L (ref 0–40)
Albumin/Globulin Ratio: 1.8 (ref 1.2–2.2)
Albumin: 4.3 g/dL (ref 3.8–4.8)
Alkaline Phosphatase: 60 IU/L (ref 44–121)
BUN/Creatinine Ratio: 12 (ref 10–24)
BUN: 14 mg/dL (ref 8–27)
Bilirubin Total: 0.6 mg/dL (ref 0.0–1.2)
CO2: 20 mmol/L (ref 20–29)
Calcium: 9.4 mg/dL (ref 8.6–10.2)
Chloride: 104 mmol/L (ref 96–106)
Creatinine, Ser: 1.13 mg/dL (ref 0.76–1.27)
Globulin, Total: 2.4 g/dL (ref 1.5–4.5)
Glucose: 109 mg/dL — ABNORMAL HIGH (ref 70–99)
Potassium: 4.2 mmol/L (ref 3.5–5.2)
Sodium: 139 mmol/L (ref 134–144)
Total Protein: 6.7 g/dL (ref 6.0–8.5)
eGFR: 69 mL/min/{1.73_m2} (ref >59)

## 2022-03-31 LAB — THYROID PANEL WITH TSH
Free Thyroxine Index: 2 (ref 1.2–4.9)
T3 Uptake Ratio: 32 % (ref 24–39)
T4, Total: 6.4 ug/dL (ref 4.5–12.0)
TSH: 3.28 u[IU]/mL (ref 0.450–4.500)

## 2022-07-07 ENCOUNTER — Encounter: Payer: Self-pay | Admitting: Nurse Practitioner

## 2022-07-07 ENCOUNTER — Ambulatory Visit (INDEPENDENT_AMBULATORY_CARE_PROVIDER_SITE_OTHER): Payer: Medicare Other | Admitting: Nurse Practitioner

## 2022-07-07 DIAGNOSIS — J40 Bronchitis, not specified as acute or chronic: Secondary | ICD-10-CM | POA: Diagnosis not present

## 2022-07-07 MED ORDER — AZITHROMYCIN 250 MG PO TABS: ORAL_TABLET | ORAL | 0 refills | Status: DC

## 2022-07-07 MED ORDER — FLUTICASONE PROPIONATE 50 MCG/ACT NA SUSP: 2.0000 | Freq: Every day | NASAL | 6 refills | Status: AC

## 2022-07-07 MED ORDER — PREDNISONE 20 MG PO TABS: 40.0000 mg | ORAL_TABLET | Freq: Every day | ORAL | 0 refills | Status: AC

## 2022-07-07 NOTE — Patient Instructions (Signed)

## 2022-07-07 NOTE — Progress Notes (Signed)
Virtual Visit  Note Due to COVID-19 pandemic this visit was conducted virtually. This visit type was conducted due to national recommendations for restrictions regarding the COVID-19 Pandemic (e.g. social distancing, sheltering in place) in an effort to limit this patient's exposure and mitigate transmission in our community. All issues noted in this document were discussed and addressed.  A physical exam was not performed with this format.  I connected with Seth Martinez on 07/07/22 at 8:13 by telephone and verified that I am speaking with the correct person using two identifiers. Seth Martinez is currently located at home and  his wife is currently with him during visit. The provider, Mary-Margaret Daphine Deutscher, FNP is located in their office at time of visit.  I discussed the limitations, risks, security and privacy concerns of performing an evaluation and management service by telephone and the availability of in person appointments. I also discussed with the patient that there may be a patient responsible charge related to this service. The patient expressed understanding and agreed to proceed.   History and Present Illness:  Patient says he gets sick like this yearly.  URI  This is a new problem. Episode onset: friday. The problem has been gradually improving. The maximum temperature recorded prior to his arrival was 101 - 101.9 F. The fever has been present for 1 to 2 days. Associated symptoms include congestion, coughing, headaches and rhinorrhea. Pertinent negatives include no sore throat. He has tried acetaminophen and antihistamine for the symptoms. The treatment provided mild relief.      Review of Systems  HENT:  Positive for congestion and rhinorrhea. Negative for sore throat.   Respiratory:  Positive for cough.   Neurological:  Positive for headaches.     Observations/Objective: Alert and oriented- answers all questions appropriately No distress Raspy voice Deep  dry cough  Assessment and Plan: Seth Martinez in today with chief complaint of No chief complaint on file.   1. Bronchitis 1. Take meds as prescribed 2. Use a cool mist humidifier especially during the winter months and when heat has been humid. 3. Use saline nose sprays frequently 4. Saline irrigations of the nose can be very helpful if done frequently.  * 4X daily for 1 week*  * Use of a nettie pot can be helpful with this. Follow directions with this* 5. Drink plenty of fluids 6. Keep thermostat turn down low 7.For any cough or congestion- mucinex 8. For fever or aces or pains- take tylenol or ibuprofen appropriate for age and weight.  * for fevers greater than 101 orally you may alternate ibuprofen and tylenol every  3 hours.    Meds ordered this encounter  Medications   fluticasone (FLONASE) 50 MCG/ACT nasal spray    Sig: Place 2 sprays into both nostrils daily.    Dispense:  16 g    Refill:  6    Order Specific Question:   Supervising Provider    Answer:   Arville Care A [[id number]]   azithromycin (ZITHROMAX Z-PAK) 250 MG tablet    Sig: As directed    Dispense:  6 tablet    Refill:  0    Order Specific Question:   Supervising Provider    Answer:   Arville Care A [[id number]]   predniSONE (DELTASONE) 20 MG tablet    Sig: Take 2 tablets (40 mg total) by mouth daily with breakfast for 5 days. 2 po daily for 5 days    Dispense:  10 tablet  Refill:  0    Order Specific Question:   Supervising Provider    Answer:   Arville Care A [[id number]]     Follow Up Instructions: prn    I discussed the assessment and treatment plan with the patient. The patient was provided an opportunity to ask questions and all were answered. The patient agreed with the plan and demonstrated an understanding of the instructions.   The patient was advised to call back or seek an in-person evaluation if the symptoms worsen or if the condition fails to improve as  anticipated.  The above assessment and management plan was discussed with the patient. The patient verbalized understanding of and has agreed to the management plan. Patient is aware to call the clinic if symptoms persist or worsen. Patient is aware when to return to the clinic for a follow-up visit. Patient educated on when it is appropriate to go to the emergency department.   Time call ended:  8:25  I provided 12 minutes of  non face-to-face time during this encounter.    Mary-Margaret Daphine Deutscher, FNP

## 2022-07-10 ENCOUNTER — Telehealth: Payer: Self-pay | Admitting: Nurse Practitioner

## 2022-07-10 NOTE — Telephone Encounter (Signed)
Pt returned call and message given to patient.  He still complains with congestion but will call us back on Tuesday if it is no better.

## 2022-07-10 NOTE — Telephone Encounter (Signed)
Please review and advise.

## 2022-07-10 NOTE — Telephone Encounter (Signed)
Patient had an appointment over the phone on  12/19 and he feels like he is not getting better. Thinks that he has bronchitis. Would like something else called in to [facility] in [place]

## 2022-07-10 NOTE — Telephone Encounter (Signed)
Patient was given z pak, prednisone and cough meds- if zpak not helping then it is vial and just has to run its course. Continue meds. Z pak will stay in system for 10 days.

## 2022-07-14 ENCOUNTER — Telehealth: Payer: Self-pay | Admitting: Nurse Practitioner

## 2022-07-14 NOTE — Telephone Encounter (Signed)
Pts wife called to let MMM know that pt is not feeling any better. Needs advise on what to do.

## 2022-07-14 NOTE — Telephone Encounter (Signed)
May ned to go to the ED- I have given him all I can

## 2022-07-14 NOTE — Telephone Encounter (Signed)
Appt made for tomorrow to be rechecked

## 2022-07-15 ENCOUNTER — Ambulatory Visit (INDEPENDENT_AMBULATORY_CARE_PROVIDER_SITE_OTHER): Payer: Medicare Other | Admitting: Nurse Practitioner

## 2022-07-15 ENCOUNTER — Encounter: Payer: Self-pay | Admitting: Nurse Practitioner

## 2022-07-15 VITALS — BP 140/85 | HR 73 | Temp 97.8°F | Resp 20 | Ht 71.0 in | Wt 314.0 lb

## 2022-07-15 DIAGNOSIS — R052 Subacute cough: Secondary | ICD-10-CM

## 2022-07-15 MED ORDER — DOXYCYCLINE HYCLATE 100 MG PO TABS: 100.0000 mg | ORAL_TABLET | Freq: Two times a day (BID) | ORAL | 0 refills | Status: DC

## 2022-07-15 NOTE — Progress Notes (Signed)
   Subjective:    Patient ID: Seth Martinez, male    DOB: Jul 23, 1950, 71 y.o.   MRN: [id number]   Chief Complaint: Cough and Nasal Congestion   Cough Associated symptoms include rhinorrhea. Pertinent negatives include no chills, fever, shortness of breath or wheezing.   Patirnt has been sick since 07/04/22. He has had prednisone and antibiotics. Congestion and cough are still lingering. Cough is worse when laying or sitting. Rhinorrhea.    Review of Systems  Constitutional:  Negative for chills and fever.  HENT:  Positive for congestion and rhinorrhea.   Respiratory:  Positive for cough. Negative for shortness of breath and wheezing.        Objective:   Physical Exam Vitals reviewed.  Constitutional:      Appearance: Normal appearance.  Cardiovascular:     Rate and Rhythm: Normal rate and regular rhythm.     Heart sounds: Normal heart sounds.  Pulmonary:     Effort: Pulmonary effort is normal.     Breath sounds: Normal breath sounds.  Skin:    General: Skin is warm.  Neurological:     General: No focal deficit present.     Mental Status: He is alert and oriented to person, place, and time.  Psychiatric:        Mood and Affect: Mood normal.        Behavior: Behavior normal.    BP (!) 140/85   Pulse 73   Temp 97.8 F (36.6 C) (Temporal)   Resp 20   Ht 5\' 11"  (1.803 m)   Wt (!) 314 lb (142.4 kg)   SpO2 94%   BMI 43.79 kg/m         Assessment & Plan:  in today with chief complaint of Cough and Nasal Congestion   1. Subacute cough 1. Take meds as prescribed 2. Use a cool mist humidifier especially during the winter months and when heat has been humid. 3. Use saline nose sprays frequently 4. Saline irrigations of the nose can be very helpful if done frequently.  * 4X daily for 1 week*  * Use of a nettie pot can be helpful with this. Follow directions with this* 5. Drink plenty of fluids 6. Keep thermostat turn down low 7.For any  cough or congestion- mucines dm 8. For fever or aces or pains- take tylenol or ibuprofen appropriate for age and weight.  * for fevers greater than 101 orally you may alternate ibuprofen and tylenol every  3 hours.    Meds ordered this encounter  Medications   doxycycline (VIBRA-TABS) 100 MG tablet    Sig: Take 1 tablet (100 mg total) by mouth 2 (two) times daily. 1 po bid    Dispense:  20 tablet    Refill:  0    Order Specific Question:   Supervising Provider    Answer:   10-05-1987 A [[id number]]       The above assessment and management plan was discussed with the patient. The patient verbalized understanding of and has agreed to the management plan. Patient is aware to call the clinic if symptoms persist or worsen. Patient is aware when to return to the clinic for a follow-up visit. Patient educated on when it is appropriate to go to the emergency department.   Mary-Margaret Arville Care, FNP

## 2022-07-15 NOTE — Patient Instructions (Signed)

## 2022-07-30 ENCOUNTER — Ambulatory Visit: Payer: Medicare Other | Admitting: Nurse Practitioner

## 2022-09-03 ENCOUNTER — Encounter: Payer: Self-pay | Admitting: Nurse Practitioner

## 2022-09-03 ENCOUNTER — Ambulatory Visit (INDEPENDENT_AMBULATORY_CARE_PROVIDER_SITE_OTHER): Payer: Medicare Other | Admitting: Nurse Practitioner

## 2022-09-03 VITALS — BP 142/86 | HR 70 | Temp 97.3°F | Resp 20 | Ht 71.0 in | Wt 315.0 lb

## 2022-09-03 DIAGNOSIS — E039 Hypothyroidism, unspecified: Secondary | ICD-10-CM | POA: Diagnosis not present

## 2022-09-03 DIAGNOSIS — E559 Vitamin D deficiency, unspecified: Secondary | ICD-10-CM

## 2022-09-03 DIAGNOSIS — R351 Nocturia: Secondary | ICD-10-CM

## 2022-09-03 DIAGNOSIS — E8881 Metabolic syndrome: Secondary | ICD-10-CM

## 2022-09-03 DIAGNOSIS — I10 Essential (primary) hypertension: Secondary | ICD-10-CM

## 2022-09-03 DIAGNOSIS — N401 Enlarged prostate with lower urinary tract symptoms: Secondary | ICD-10-CM

## 2022-09-03 DIAGNOSIS — Z789 Other specified health status: Secondary | ICD-10-CM

## 2022-09-03 DIAGNOSIS — E782 Mixed hyperlipidemia: Secondary | ICD-10-CM

## 2022-09-03 DIAGNOSIS — Z6841 Body Mass Index (BMI) 40.0 and over, adult: Secondary | ICD-10-CM

## 2022-09-03 LAB — BAYER DCA HB A1C WAIVED: HB A1C (BAYER DCA - WAIVED): 6.2 % — ABNORMAL HIGH (ref 4.8–5.6)

## 2022-09-03 LAB — LIPID PANEL

## 2022-09-03 MED ORDER — RAMIPRIL 5 MG PO CAPS: 5.0000 mg | ORAL_CAPSULE | Freq: Every day | ORAL | 1 refills | Status: DC

## 2022-09-03 MED ORDER — LEVOTHYROXINE SODIUM 50 MCG PO TABS: 50.0000 ug | ORAL_TABLET | Freq: Every day | ORAL | 1 refills | Status: DC

## 2022-09-03 NOTE — Patient Instructions (Signed)

## 2022-09-03 NOTE — Progress Notes (Signed)
Subjective:    Patient ID: Seth Martinez, male    DOB: Nov 20, 1950, 72 y.o.   MRN: [id number]   Chief Complaint: Medical Management of Chronic Issues    HPI:  Seth Martinez is a 72 y.o. who identifies as a male who was assigned male at birth.   Social history: Lives with: wife Work history: retired   Scientist, forensic in today for follow up of the following chronic medical issues:  1. Primary hypertension No c/o chest pain, sob or headache. Does not check blood pressure meds. BP Readings from Last 3 Encounters:  09/03/22 (!) 142/86  07/15/22 (!) 140/85  03/30/22 (!) 142/76     2. Mixed hyperlipidemia Does not wtahc diet and does no dedicated exercise. Lab Results  Component Value Date   CHOL 175 03/30/2022   HDL 37 (L) 03/30/2022   LDLCALC 98 03/30/2022   TRIG 233 (H) 03/30/2022   CHOLHDL 4.7 03/30/2022     3. Statin intolerance Can not tolerate statin  4. Metabolic syndrome Doe snot check blood sugars at h ome. Lab Results  Component Value Date   HGBA1C 5.5 03/30/2022     5. Acquired hypothyroidism No problems that he is aware of. Lab Results  Component Value Date   TSH 3.280 03/30/2022     6. Benign prostatic hyperplasia with nocturia No voiding issues. Has some trouble getting stream to start Lab Results  Component Value Date   PSA1 0.8 09/25/2021   PSA1 0.7 11/15/2019   PSA1 0.7 06/22/2018   PSA 0.4 09/28/2013      7. Vitamin D deficiency Is not on daily vitamin d supplement Last vitamin D Lab Results  Component Value Date   VD25OH 42.3 03/27/2021     8. Morbid obesity ([place]) No recent weight changes Wt Readings from Last 3 Encounters:  09/03/22 (!) 315 lb (142.9 kg)  07/15/22 (!) 314 lb (142.4 kg)  03/30/22 (!) 319 lb (144.7 kg)   BMI Readings from Last 3 Encounters:  09/03/22 43.93 kg/m  07/15/22 43.79 kg/m  03/30/22 44.49 kg/m      New complaints: None today  Allergies  Allergen Reactions   Morphine And  Related Anaphylaxis   Penicillins Anaphylaxis    Has patient had a PCN reaction causing immediate rash, facial/tongue/throat swelling, SOB or lightheadedness with hypotension: Yes Has patient had a PCN reaction causing severe rash involving mucus membranes or skin necrosis: No Has patient had a PCN reaction that required hospitalization No Has patient had a PCN reaction occurring within the last 10 years: No If all of the above answers are "NO", then may proceed with Cephalosporin use.   Vancomycin Rash and Other (See Comments)    Vasculitis, Red Man's Syndrome   Amitriptyline Other (See Comments)    Unknown   Antara [Fenofibrate] Other (See Comments)    Unknown   Bextra [Valdecoxib] Other (See Comments)    Unknown   Cefdinir Other (See Comments)    Unknown   Celebrex [Celecoxib] Other (See Comments)    Unknown   Crestor [Rosuvastatin Calcium] Other (See Comments)    Unknown   Escitalopram Oxalate Other (See Comments)    Unknown   Fish Oil Other (See Comments)    Unknown   Ibuprofen Swelling   Naproxen Sodium Other (See Comments)    Unknown   Neurontin [Gabapentin] Other (See Comments)    Unknown   Skelaxin Other (See Comments)    Unknown   Ultram [Tramadol] Other (See Comments)  Unknown   Vioxx [Rofecoxib] Other (See Comments)    Unknown   Zocor [Simvastatin] Other (See Comments)    Unknown   Outpatient Encounter Medications as of 09/03/2022  Medication Sig   aspirin 325 MG tablet Take 325 mg by mouth daily.   clotrimazole-betamethasone (LOTRISONE) cream Apply 1 application topically daily.   EPINEPHrine (EPI-PEN) 0.3 mg/0.3 mL SOAJ injection Inject 0.3 mLs (0.3 mg total) into the muscle once.   fluticasone (FLONASE) 50 MCG/ACT nasal spray Place 2 sprays into both nostrils daily.   levothyroxine (SYNTHROID) 50 MCG tablet Take 1 tablet (50 mcg total) by mouth daily.   ramipril (ALTACE) 5 MG capsule Take 1 capsule (5 mg total) by mouth daily.   [DISCONTINUED]  doxycycline (VIBRA-TABS) 100 MG tablet Take 1 tablet (100 mg total) by mouth 2 (two) times daily. 1 po bid   No facility-administered encounter medications on file as of 09/03/2022.    Past Surgical History:  Procedure Laterality Date   BACK SURGERY  1992   lumb fusion   [place]   right-fx   fix screws in sacrum  in office  1993   fusion lt sacrum with screws  [place] ing 42 weeks old   I & D EXTREMITY Right 06/23/2013   Procedure: IRRIGATION AND DEBRIDEMENT EXTREMITY;  Surgeon: Roseanne Kaufman, MD;  Location: [place];  Service: Orthopedics;  Laterality: Right;   INGUINAL HERNIA REPAIR     bilat age 1   KNEE ARTHROSCOPY Left 05/04/2013   Procedure: ARTHROSCOPY KNEE WITH WASHOUT;  Surgeon: Renette Butters, MD;  Location: [place];  Service: Orthopedics;  Laterality: Left;   ORIF CLAVICLE FRACTURE  1956   left-age 30   ORIF FIBULA FRACTURE  1956   lt-accident age 19yr   ORIF PELVIC FRACTURE  1956   age 72  [place] 30   rt knee arthroscopic  07/2006   TEE WITHOUT CARDIOVERSION N/A 05/04/2013   Procedure: TRANSESOPHAGEAL ECHOCARDIOGRAM (TEE);  Surgeon: MCandee Furbish MD;  [facility];  Service: Cardiovascular;  Laterality: N/A;   TENDON REPAIR Right 06/23/2013   Procedure: RIGHT INDEX FINGER AND PALM IRRIGATION AND DEBRIDEMENT FLEXOR  TENOSYNOVECTOMY BX AND REPAIR AS NECESSARY ;  Surgeon: WRoseanne Kaufman MD;  [facility]  Service: Orthopedics;  Laterality: Right;    Family History  Problem Relation Age of Onset   Stroke Father    Diabetes type II Brother       Controlled substance contract: n/a     Review of Systems  Constitutional:  Negative for diaphoresis.  Eyes:  Negative for pain.  Respiratory:  Negative for shortness of breath.   Cardiovascular:  Negative for chest pain, palpitations and leg swelling.  Gastrointestinal:  Negative for abdominal  pain.  Endocrine: Negative for polydipsia.  Skin:  Negative for rash.  Neurological:  Negative for dizziness, weakness and headaches.  Hematological:  Does not bruise/bleed easily.  All other systems reviewed and are negative.      Objective:   Physical Exam Vitals and nursing note reviewed.  Constitutional:      Appearance: Normal appearance. He is well-developed.  HENT:     Head: Normocephalic.     Nose: Nose normal.     Mouth/Throat:     Mouth: Mucous membranes are moist.     Pharynx: Oropharynx is clear.  Eyes:  Pupils: Pupils are equal, round, and reactive to light.  Neck:     Thyroid: No thyroid mass or thyromegaly.     Vascular: No carotid bruit or JVD.     Trachea: Phonation normal.  Cardiovascular:     Rate and Rhythm: Normal rate and regular rhythm.  Pulmonary:     Effort: Pulmonary effort is normal. No respiratory distress.     Breath sounds: Normal breath sounds.  Abdominal:     General: Bowel sounds are normal.     Palpations: Abdomen is soft.     Tenderness: There is no abdominal tenderness.  Musculoskeletal:        General: Normal range of motion.     Cervical back: Normal range of motion and neck supple.  Lymphadenopathy:     Cervical: No cervical adenopathy.  Skin:    General: Skin is warm and dry.  Neurological:     Mental Status: He is alert and oriented to person, place, and time.  Psychiatric:        Behavior: Behavior normal.        Thought Content: Thought content normal.        Judgment: Judgment normal.     BP (!) 142/86   Pulse 70   Temp (!) 97.3 F (36.3 C) (Temporal)   Resp 20   Ht 5' 11"$  (1.803 m)   Wt (!) 315 lb (142.9 kg)   SpO2 94%   BMI 43.93 kg/m   Hgba1c 6.2%     Assessment & Plan:   Seth Martinez comes in today with chief complaint of Medical Management of Chronic Issues   Diagnosis and orders addressed:  1. Primary hypertension Low sodium diet - CBC with Differential/Platelet - CMP14+EGFR -  ramipril (ALTACE) 5 MG capsule; Take 1 capsule (5 mg total) by mouth daily.  Dispense: 90 capsule; Refill: 1  2. Mixed hyperlipidemia Low fat diet - Lipid panel  3. Statin intolerance  4. Metabolic syndrome Watch carbs in diet - Bayer DCA Hb A1c Waived  5. Acquired hypothyroidism labsoending - levothyroxine (SYNTHROID) 50 MCG tablet; Take 1 tablet (50 mcg total) by mouth daily.  Dispense: 90 tablet; Refill: 1  6. Benign prostatic hyperplasia with nocturia Report any voiding issues  7. Vitamin D deficiency Daily vitamin d supplement  8. Morbid obesity ([place]) Discussed diet and exercise for person with BMI >25 Will recheck weight in 3-6 months    Labs pending Health Maintenance reviewed Diet and exercise encouraged  Follow up plan: 6 months   Mary-Margaret Hassell Done, FNP

## 2022-09-04 LAB — CBC WITH DIFFERENTIAL/PLATELET
Basophils Absolute: 0.1 10*3/uL (ref 0.0–0.2)
Basos: 1 %
EOS (ABSOLUTE): 0.3 10*3/uL (ref 0.0–0.4)
Eos: 4 %
Hematocrit: 47.9 % (ref 37.5–51.0)
Hemoglobin: 15.9 g/dL (ref 13.0–17.7)
Immature Grans (Abs): 0 10*3/uL (ref 0.0–0.1)
Immature Granulocytes: 0 %
Lymphocytes Absolute: 1.7 10*3/uL (ref 0.7–3.1)
Lymphs: 22 %
MCH: 29.1 pg (ref 26.6–33.0)
MCHC: 33.2 g/dL (ref 31.5–35.7)
MCV: 88 fL (ref 79–97)
Monocytes Absolute: 0.6 10*3/uL (ref 0.1–0.9)
Monocytes: 8 %
Neutrophils Absolute: 5.1 10*3/uL (ref 1.4–7.0)
Neutrophils: 65 %
Platelets: 233 10*3/uL (ref 150–450)
RBC: 5.47 x10E6/uL (ref 4.14–5.80)
RDW: 13.7 % (ref 11.6–15.4)
WBC: 7.9 10*3/uL (ref 3.4–10.8)

## 2022-09-04 LAB — CMP14+EGFR
ALT: 27 IU/L (ref 0–44)
AST: 30 IU/L (ref 0–40)
Albumin/Globulin Ratio: 2 (ref 1.2–2.2)
Albumin: 4.5 g/dL (ref 3.8–4.8)
Alkaline Phosphatase: 58 IU/L (ref 44–121)
BUN/Creatinine Ratio: 9 — ABNORMAL LOW (ref 10–24)
BUN: 11 mg/dL (ref 8–27)
Bilirubin Total: 0.7 mg/dL (ref 0.0–1.2)
CO2: 20 mmol/L (ref 20–29)
Calcium: 9.2 mg/dL (ref 8.6–10.2)
Chloride: 101 mmol/L (ref 96–106)
Creatinine, Ser: 1.21 mg/dL (ref 0.76–1.27)
Globulin, Total: 2.3 g/dL (ref 1.5–4.5)
Glucose: 112 mg/dL — ABNORMAL HIGH (ref 70–99)
Potassium: 4.2 mmol/L (ref 3.5–5.2)
Sodium: 140 mmol/L (ref 134–144)
Total Protein: 6.8 g/dL (ref 6.0–8.5)
eGFR: 63 mL/min/{1.73_m2} (ref >59)

## 2022-09-04 LAB — LIPID PANEL
Chol/HDL Ratio: 4.2 ratio (ref 0.0–5.0)
Cholesterol, Total: 169 mg/dL (ref 100–199)
HDL: 40 mg/dL (ref >39)
LDL Chol Calc (NIH): 101 mg/dL — ABNORMAL HIGH (ref 0–99)
Triglycerides: 161 mg/dL — ABNORMAL HIGH (ref 0–149)
VLDL Cholesterol Cal: 28 mg/dL (ref 5–40)

## 2022-12-24 DIAGNOSIS — H524 Presbyopia: Secondary | ICD-10-CM | POA: Diagnosis not present

## 2022-12-24 DIAGNOSIS — H5213 Myopia, bilateral: Secondary | ICD-10-CM | POA: Diagnosis not present

## 2022-12-25 ENCOUNTER — Ambulatory Visit (INDEPENDENT_AMBULATORY_CARE_PROVIDER_SITE_OTHER): Payer: Medicare Other

## 2022-12-25 VITALS — Ht 70.0 in | Wt 315.0 lb

## 2022-12-25 DIAGNOSIS — Z0000 Encounter for general adult medical examination without abnormal findings: Secondary | ICD-10-CM

## 2022-12-25 NOTE — Patient Instructions (Signed)
[name] , Thank you for taking time to come for your Medicare Wellness Visit. I appreciate your ongoing commitment to your health goals. Please review the following plan we discussed and let me know if I can assist you in the future.   These are the goals we discussed:  Goals      DIET - INCREASE WATER INTAKE     Patient Stated     10/13/2019 AWV Goal: Keep All Scheduled Appointments  Over the next year, patient will attend all scheduled appointments with their PCP and any specialists that they see.      Patient Stated     10/15/2020 AWV Goal: Exercise for General Health  Patient will verbalize understanding of the benefits of increased physical activity: Exercising regularly is important. It will improve your overall fitness, flexibility, and endurance. Regular exercise also will improve your overall health. It can help you control your weight, reduce stress, and improve your bone density. Over the next year, patient will increase physical activity as tolerated with a goal of at least 150 minutes of moderate physical activity per week.  You can tell that you are exercising at a moderate intensity if your heart starts beating faster and you start breathing faster but can still hold a conversation. Moderate-intensity exercise ideas include: Walking 1 mile (1.6 km) in about 15 minutes Biking Hiking Golfing Dancing Water aerobics Patient will verbalize understanding of everyday activities that increase physical activity by providing examples like the following: Yard work, such as: Insurance underwriter Gardening Washing windows or floors Patient will be able to explain general safety guidelines for exercising:  Before you start a new exercise program, talk with your health care provider. Do not exercise so much that you hurt yourself, feel dizzy, or get very short of breath. Wear comfortable clothes  and wear shoes with good support. Drink plenty of water while you exercise to prevent dehydration or heat stroke. Work out until your breathing and your heartbeat get faster.      Weight (lb) < 300 lb (136.1 kg)     Reduce intake of fried foods and sweets.         This is a list of the screening recommended for you and due dates:  Health Maintenance  Topic Date Due   Hepatitis C Screening  Never done   Cologuard (Stool DNA test)  Never done   Zoster (Shingles) Vaccine (1 of 2) Never done   Pneumonia Vaccine (1 of 1 - PCV) Never done   DTaP/Tdap/Td vaccine (2 - Td or Tdap) 05/10/2021   Yearly kidney health urinalysis for diabetes  09/26/2022   Complete foot exam   09/26/2022   COVID-19 Vaccine (1) 01/10/2023*   Flu Shot  02/18/2023   Hemoglobin A1C  03/04/2023   Yearly kidney function blood test for diabetes  09/04/2023   Eye exam for diabetics  12/24/2023   Medicare Annual Wellness Visit  12/25/2023   HPV Vaccine  Aged Out   Colon Cancer Screening  Discontinued  *Topic was postponed. The date shown is not the original due date.    Advanced directives: Advance directive discussed with you today. I have provided a copy for you to complete at home and have notarized. Once this is complete please bring a copy in to our office so we can scan it into your chart.   Conditions/risks identified: Aim for 30 minutes of exercise or  brisk walking, 6-8 glasses of water, and 5 servings of fruits and vegetables each day.   Next appointment: Follow up in one year for your annual wellness visit.   Preventive Care 72 Years and Older, Male  Preventive care refers to lifestyle choices and visits with your health care provider that can promote health and wellness. What does preventive care include? A yearly physical exam. This is also called an annual well check. Dental exams once or twice a year. Routine eye exams. Ask your health care provider how often you should have your eyes  checked. Personal lifestyle choices, including: Daily care of your teeth and gums. Regular physical activity. Eating a healthy diet. Avoiding tobacco and drug use. Limiting alcohol use. Practicing safe sex. Taking low doses of aspirin every day. Taking vitamin and mineral supplements as recommended by your health care provider. What happens during an annual well check? The services and screenings done by your health care provider during your annual well check will depend on your age, overall health, lifestyle risk factors, and family history of disease. Counseling  Your health care provider may ask you questions about your: Alcohol use. Tobacco use. Drug use. Emotional well-being. Home and relationship well-being. Sexual activity. Eating habits. History of falls. Memory and ability to understand (cognition). Work and work Astronomer. Screening  You may have the following tests or measurements: Height, weight, and BMI. Blood pressure. Lipid and cholesterol levels. These may be checked every 5 years, or more frequently if you are over 52 years old. Skin check. Lung cancer screening. You may have this screening every year starting at age 73 if you have a 30-pack-year history of smoking and currently smoke or have quit within the past 15 years. Fecal occult blood test (FOBT) of the stool. You may have this test every year starting at age 66. Flexible sigmoidoscopy or colonoscopy. You may have a sigmoidoscopy every 5 years or a colonoscopy every 10 years starting at age 30. Prostate cancer screening. Recommendations will vary depending on your family history and other risks. Hepatitis C blood test. Hepatitis B blood test. Sexually transmitted disease (STD) testing. Diabetes screening. This is done by checking your blood sugar (glucose) after you have not eaten for a while (fasting). You may have this done every 1-3 years. Abdominal aortic aneurysm (AAA) screening. You may need this  if you are a current or former smoker. Osteoporosis. You may be screened starting at age 27 if you are at high risk. Talk with your health care provider about your test results, treatment options, and if necessary, the need for more tests. Vaccines  Your health care provider may recommend certain vaccines, such as: Influenza vaccine. This is recommended every year. Tetanus, diphtheria, and acellular pertussis (Tdap, Td) vaccine. You may need a Td booster every 10 years. Zoster vaccine. You may need this after age 51. Pneumococcal 13-valent conjugate (PCV13) vaccine. One dose is recommended after age 60. Pneumococcal polysaccharide (PPSV23) vaccine. One dose is recommended after age 70. Talk to your health care provider about which screenings and vaccines you need and how often you need them. This information is not intended to replace advice given to you by your health care provider. Make sure you discuss any questions you have with your health care provider. Document Released: 08/02/2015 Document Revised: 03/25/2016 Document Reviewed: 05/07/2015 Elsevier Interactive Patient Education  2017 [employer].  Fall Prevention in the Home Falls can cause injuries. They can happen to people of all ages. There are many  things you can do to make your home safe and to help prevent falls. What can I do on the outside of my home? Regularly fix the edges of walkways and driveways and fix any cracks. Remove anything that might make you trip as you walk through a door, such as a raised step or threshold. Trim any bushes or trees on the path to your home. Use bright outdoor lighting. Clear any walking paths of anything that might make someone trip, such as rocks or tools. Regularly check to see if handrails are loose or broken. Make sure that both sides of any steps have handrails. Any raised decks and porches should have guardrails on the edges. Have any leaves, snow, or ice cleared regularly. Use sand  or salt on walking paths during winter. Clean up any spills in your garage right away. This includes oil or grease spills. What can I do in the bathroom? Use night lights. Install grab bars by the toilet and in the tub and shower. Do not use towel bars as grab bars. Use non-skid mats or decals in the tub or shower. If you need to sit down in the shower, use a plastic, non-slip stool. Keep the floor dry. Clean up any water that spills on the floor as soon as it happens. Remove soap buildup in the tub or shower regularly. Attach bath mats securely with double-sided non-slip rug tape. Do not have throw rugs and other things on the floor that can make you trip. What can I do in the bedroom? Use night lights. Make sure that you have a light by your bed that is easy to reach. Do not use any sheets or blankets that are too big for your bed. They should not hang down onto the floor. Have a firm chair that has side arms. You can use this for support while you get dressed. Do not have throw rugs and other things on the floor that can make you trip. What can I do in the kitchen? Clean up any spills right away. Avoid walking on wet floors. Keep items that you use a lot in easy-to-reach places. If you need to reach something above you, use a strong step stool that has a grab bar. Keep electrical cords out of the way. Do not use floor polish or wax that makes floors slippery. If you must use wax, use non-skid floor wax. Do not have throw rugs and other things on the floor that can make you trip. What can I do with my stairs? Do not leave any items on the stairs. Make sure that there are handrails on both sides of the stairs and use them. Fix handrails that are broken or loose. Make sure that handrails are as long as the stairways. Check any carpeting to make sure that it is firmly attached to the stairs. Fix any carpet that is loose or worn. Avoid having throw rugs at the top or bottom of the stairs.  If you do have throw rugs, attach them to the floor with carpet tape. Make sure that you have a light switch at the top of the stairs and the bottom of the stairs. If you do not have them, ask someone to add them for you. What else can I do to help prevent falls? Wear shoes that: Do not have high heels. Have rubber bottoms. Are comfortable and fit you well. Are closed at the toe. Do not wear sandals. If you use a stepladder: Make sure that it  is fully opened. Do not climb a closed stepladder. Make sure that both sides of the stepladder are locked into place. Ask someone to hold it for you, if possible. Clearly mark and make sure that you can see: Any grab bars or handrails. First and last steps. Where the edge of each step is. Use tools that help you move around (mobility aids) if they are needed. These include: Canes. Walkers. Scooters. Crutches. Turn on the lights when you go into a dark area. Replace any light bulbs as soon as they burn out. Set up your furniture so you have a clear path. Avoid moving your furniture around. If any of your floors are uneven, fix them. If there are any pets around you, be aware of where they are. Review your medicines with your doctor. Some medicines can make you feel dizzy. This can increase your chance of falling. Ask your doctor what other things that you can do to help prevent falls. This information is not intended to replace advice given to you by your health care provider. Make sure you discuss any questions you have with your health care provider. Document Released: 05/02/2009 Document Revised: 12/12/2015 Document Reviewed: 08/10/2014 Elsevier Interactive Patient Education  2017 [employer].

## 2022-12-25 NOTE — Progress Notes (Signed)
Subjective:   Seth Martinez is a 72 y.o. male who presents for Medicare Annual/Subsequent preventive examination. I connected with  [name] on 12/25/22 by a audio enabled telemedicine application and verified that I am speaking with the correct person using two identifiers.  Patient Location: Home  Provider Location: Home Office  I discussed the limitations of evaluation and management by telemedicine. The patient expressed understanding and agreed to proceed.  Review of Systems     Cardiac Risk Factors include: advanced age (>6men, >54 women);male gender;sedentary lifestyle     Objective:    Today's Vitals   12/25/22 1528  Weight: (!) 315 lb (142.9 kg)  Height: 5\' 10"  (1.778 m)   Body mass index is 45.2 kg/m.     12/25/2022    3:34 PM 12/22/2021   10:09 AM 10/15/2020    3:04 PM 10/13/2019    2:31 PM 04/27/2018   11:01 AM 06/22/2013   10:30 AM 05/16/2013   12:43 AM  Advanced Directives  Does Patient Have a Medical Advance Directive? No No No No No Patient does not have advance directive;Patient would not like information Patient does not have advance directive  Would patient like information on creating a medical advance directive? No - Patient declined No - Patient declined No - Patient declined No - Patient declined Yes (MAU/Ambulatory/Procedural Areas - Information given)    Pre-existing out of facility DNR order (yellow form or pink MOST form)       No    Current Medications (verified) Outpatient Encounter Medications as of 12/25/2022  Medication Sig   aspirin 325 MG tablet Take 325 mg by mouth daily.   clotrimazole-betamethasone (LOTRISONE) cream Apply 1 application topically daily.   EPINEPHrine (EPI-PEN) 0.3 mg/0.3 mL SOAJ injection Inject 0.3 mLs (0.3 mg total) into the muscle once.   fluticasone (FLONASE) 50 MCG/ACT nasal spray Place 2 sprays into both nostrils daily.   levothyroxine (SYNTHROID) 50 MCG tablet Take 1 tablet (50 mcg total) by mouth  daily.   ramipril (ALTACE) 5 MG capsule Take 1 capsule (5 mg total) by mouth daily.   No facility-administered encounter medications on file as of 12/25/2022.    Allergies (verified) Morphine and codeine, Penicillins, Vancomycin, Amitriptyline, Antara [fenofibrate], Bextra [valdecoxib], Cefdinir, Celebrex [celecoxib], Crestor [rosuvastatin calcium], Escitalopram oxalate, Fish oil, Ibuprofen, Naproxen sodium, Neurontin [gabapentin], Skelaxin, Ultram [tramadol], Vioxx [rofecoxib], and Zocor [simvastatin]   History: Past Medical History:  Diagnosis Date   Chronic back pain    Depression    Fatigue    Hyperlipidemia    Hypertension    Insomnia    Joint pain    Meniscus tear    bilateral knees   Metabolic syndrome    Obesity    Septic arthritis (HCC) October/2014   Vertigo    Past Surgical History:  Procedure Laterality Date   BACK SURGERY  1992   lumb fusion   ELBOW SURGERY  1965   right-fx   fix screws in sacrum  in office  1993   fusion lt sacrum with screws  1993   HERNIA REPAIR     bilat ing 32 weeks old   I & D EXTREMITY Right 06/23/2013   Procedure: IRRIGATION AND DEBRIDEMENT EXTREMITY;  Surgeon: Dominica Severin, MD;  Location: [facility];  Service: Orthopedics;  Laterality: Right;   INGUINAL HERNIA REPAIR     bilat age 64   KNEE ARTHROSCOPY Left 05/04/2013   Procedure: ARTHROSCOPY KNEE WITH WASHOUT;  Surgeon: Marcial Pacas  Jamison Neighbor, MD;  Location: [facility] OR;  Service: Orthopedics;  Laterality: Left;   ORIF CLAVICLE FRACTURE  1956   left-age 27   ORIF FIBULA FRACTURE  1956   lt-accident age 1yr-   ORIF PELVIC FRACTURE  1956   age 47   ORIF RADIUS & ULNA FRACTURES  1956   left-age 27   rt knee arthroscopic  07/2006   TEE WITHOUT CARDIOVERSION N/A 05/04/2013   Procedure: TRANSESOPHAGEAL ECHOCARDIOGRAM (TEE);  Surgeon: Donato Schultz, MD;  Location: [facility] ENDOSCOPY;  Service: Cardiovascular;  Laterality: N/A;   TENDON REPAIR Right 06/23/2013   Procedure: RIGHT INDEX FINGER  AND PALM IRRIGATION AND DEBRIDEMENT FLEXOR  TENOSYNOVECTOMY BX AND REPAIR AS NECESSARY ;  Surgeon: Dominica Severin, MD;  Location: [facility];  Service: Orthopedics;  Laterality: Right;   Family History  Problem Relation Age of Onset   Stroke Father    Diabetes type II Brother    Social History   Socioeconomic History   Marital status: Married    Spouse name: Annice Pih   Number of children: 2   Years of education: 12   Highest education level: 12th grade  Occupational History   Occupation: retired  Tobacco Use   Smoking status: Never   Smokeless tobacco: Never  Vaping Use   Vaping Use: Never used  Substance and Sexual Activity   Alcohol use: No    Comment: rare   Drug use: No   Sexual activity: Not on file  Other Topics Concern   Not on file  Social History Narrative   ** Merged History Encounter **       Seth Martinez is retired and lives at home with his wife Annice Pih. His 80 yo mother is currently residing with them. He has two grown children. He enjoys watching TV.   Social Determinants of Health   Financial Resource Strain: Low Risk  (12/25/2022)   Overall Financial Resource Strain (CARDIA)    Difficulty of Paying Living Expenses: Not hard at all  Food Insecurity: No Food Insecurity (12/25/2022)   Hunger Vital Sign    Worried About Running Out of Food in the Last Year: Never true    Ran Out of Food in the Last Year: Never true  Transportation Needs: No Transportation Needs (12/25/2022)   PRAPARE - Administrator, Civil Service (Medical): No    Lack of Transportation (Non-Medical): No  Physical Activity: Inactive (12/25/2022)   Exercise Vital Sign    Days of Exercise per Week: 0 days    Minutes of Exercise per Session: 0 min  Stress: No Stress Concern Present (12/25/2022)   [employer] of Occupational Health - Occupational Stress Questionnaire    Feeling of Stress : Not at all  Social Connections: Moderately Integrated (12/25/2022)   Social  Connection and Isolation Panel [NHANES]    Frequency of Communication with Friends and Family: More than three times a week    Frequency of Social Gatherings with Friends and Family: More than three times a week    Attends Religious Services: More than 4 times per year    Active Member of [employer] or Organizations: No    Attends Banker Meetings: Never    Marital Status: Married    Tobacco Counseling Counseling given: Not Answered   Clinical Intake:  Pre-visit preparation completed: Yes  Pain : No/denies pain     Nutritional Risks: None Diabetes: No  How often do you need to have someone help you when you  read instructions, pamphlets, or other written materials from your doctor or pharmacy?: 1 - Never  Diabetic?no   Interpreter Needed?: No  Information entered by :: Renie Ora, LPN   Activities of Daily Living    12/25/2022    3:34 PM  In your present state of health, do you have any difficulty performing the following activities:  Hearing? 0  Vision? 0  Difficulty concentrating or making decisions? 0  Walking or climbing stairs? 0  Dressing or bathing? 0  Doing errands, shopping? 0  Preparing Food and eating ? N  Using the Toilet? N  In the past six months, have you accidently leaked urine? N  Do you have problems with loss of bowel control? N  Managing your Medications? N  Managing your Finances? N  Housekeeping or managing your Housekeeping? N    Patient Care Team: Bennie Pierini, FNP as PCP - General (Family Medicine) Barnett Abu, MD as Consulting Physician (Neurosurgery)  Indicate any recent Medical Services you may have received from other than [facility] providers in the past year (date may be approximate).     Assessment:   This is a routine wellness examination for Seth Martinez.  Hearing/Vision screen Vision Screening - Comments:: Wears rx glasses - up to date with routine eye exams with  [facility]   Dietary issues and exercise  activities discussed: Current Exercise Habits: The patient does not participate in regular exercise at present, Exercise limited by: orthopedic condition(s)   Goals Addressed             This Visit's Progress    DIET - INCREASE WATER INTAKE         Depression Screen    12/25/2022    3:33 PM 09/03/2022   10:52 AM 07/15/2022    3:35 PM 03/30/2022   10:13 AM 12/22/2021    9:58 AM 09/25/2021   10:51 AM 03/27/2021   11:26 AM  PHQ 2/9 Scores  PHQ - 2 Score 0 0 0 0 0 1 2  PHQ- 9 Score  2 0 3  7 5     Fall Risk    12/25/2022    3:30 PM 09/03/2022   10:51 AM 07/15/2022    3:35 PM 03/30/2022   10:13 AM 12/22/2021   10:06 AM  Fall Risk   Falls in the past year? 1 0 0 1 1  Number falls in past yr: 1   0 0  Injury with Fall? 1   0 0  Risk for fall due to : History of fall(s);Impaired balance/gait;Orthopedic patient   History of fall(s) Impaired balance/gait;History of fall(s)  Follow up Education provided;Falls prevention discussed   Education provided Falls prevention discussed    FALL RISK PREVENTION PERTAINING TO THE HOME:  Any stairs in or around the home? No  If so, are there any without handrails? No  Home free of loose throw rugs in walkways, pet beds, electrical cords, etc? Yes  Adequate lighting in your home to reduce risk of falls? Yes   ASSISTIVE DEVICES UTILIZED TO PREVENT FALLS:  Life alert? No  Use of a cane, walker or w/c? No  Grab bars in the bathroom? Yes  Shower chair or bench in shower? Yes  Elevated toilet seat or a handicapped toilet? Yes       04/27/2018   11:30 AM 06/10/2015   12:19 PM  MMSE - Mini Mental State Exam  Orientation to time 5 5  Orientation to Place 5 5  Registration 3 3  Attention/ Calculation 5 5  Recall 2 3  Language- name 2 objects 2 2  Language- repeat 1 1  Language- follow 3 step command 3 3  Language- read & follow direction 1 1  Write a sentence 1 1  Copy design 1 1  Total score 29 30        12/25/2022    3:35 PM 12/22/2021    10:10 AM 10/15/2020    3:07 PM 10/13/2019    2:37 PM  6CIT Screen  What Year? 0 points 0 points 0 points 0 points  What month? 0 points 0 points 0 points 0 points  What time? 0 points 0 points 0 points 0 points  Count back from 20 0 points 0 points 0 points 0 points  Months in reverse 0 points 0 points 0 points 0 points  Repeat phrase 0 points 0 points 0 points 0 points  Total Score 0 points 0 points 0 points 0 points    Immunizations Immunization History  Administered Date(s) Administered   Tdap 05/11/2011    TDAP status: Due, Education has been provided regarding the importance of this vaccine. Advised may receive this vaccine at local pharmacy or Health Dept. Aware to provide a copy of the vaccination record if obtained from local pharmacy or Health Dept. Verbalized acceptance and understanding.  Flu Vaccine status: Declined, Education has been provided regarding the importance of this vaccine but patient still declined. Advised may receive this vaccine at local pharmacy or Health Dept. Aware to provide a copy of the vaccination record if obtained from local pharmacy or Health Dept. Verbalized acceptance and understanding.  Pneumococcal vaccine status: Declined,  Education has been provided regarding the importance of this vaccine but patient still declined. Advised may receive this vaccine at local pharmacy or Health Dept. Aware to provide a copy of the vaccination record if obtained from local pharmacy or Health Dept. Verbalized acceptance and understanding.   Covid-19 vaccine status: Declined, Education has been provided regarding the importance of this vaccine but patient still declined. Advised may receive this vaccine at local pharmacy or Health Dept.or vaccine clinic. Aware to provide a copy of the vaccination record if obtained from local pharmacy or Health Dept. Verbalized acceptance and understanding.  Qualifies for Shingles Vaccine? Yes   Zostavax completed No   Shingrix  Completed?: No.    Education has been provided regarding the importance of this vaccine. Patient has been advised to call insurance company to determine out of pocket expense if they have not yet received this vaccine. Advised may also receive vaccine at local pharmacy or Health Dept. Verbalized acceptance and understanding.  Screening Tests Health Maintenance  Topic Date Due   Hepatitis C Screening  Never done   Fecal DNA (Cologuard)  Never done   Zoster Vaccines- Shingrix (1 of 2) Never done   Pneumonia Vaccine 30+ Years old (1 of 1 - PCV) Never done   DTaP/Tdap/Td (2 - Td or Tdap) 05/10/2021   Diabetic kidney evaluation - Urine ACR  09/26/2022   FOOT EXAM  09/26/2022   COVID-19 Vaccine (1) 01/10/2023 (Originally 11/07/1951)   INFLUENZA VACCINE  02/18/2023   HEMOGLOBIN A1C  03/04/2023   Diabetic kidney evaluation - eGFR measurement  09/04/2023   OPHTHALMOLOGY EXAM  12/24/2023   Medicare Annual Wellness (AWV)  12/25/2023   HPV VACCINES  Aged Out   Colonoscopy  Discontinued    Health Maintenance  Health Maintenance Due  Topic Date Due   Hepatitis C Screening  Never done   Fecal DNA (Cologuard)  Never done   Zoster Vaccines- Shingrix (1 of 2) Never done   Pneumonia Vaccine 61+ Years old (1 of 1 - PCV) Never done   DTaP/Tdap/Td (2 - Td or Tdap) 05/10/2021   Diabetic kidney evaluation - Urine ACR  09/26/2022   FOOT EXAM  09/26/2022    Colorectal cancer screening: Referral to GI placed Patient has kit to complete. Pt aware the office will call re: appt.  Lung Cancer Screening: (Low Dose CT Chest recommended if Age 86-80 years, 30 pack-year currently smoking OR have quit w/in 15years.) does not qualify.   Lung Cancer Screening Referral: n/a  Additional Screening:  Hepatitis C Screening: does qualify;   Vision Screening: Recommended annual ophthalmology exams for early detection of glaucoma and other disorders of the eye. Is the patient up to date with their annual eye exam?   Yes  Who is the provider or what is the name of the office in which the patient attends annual eye exams? [facility]  If pt is not established with a provider, would they like to be referred to a provider to establish care? No .   Dental Screening: Recommended annual dental exams for proper oral hygiene  Community Resource Referral / Chronic Care Management: CRR required this visit?  No   CCM required this visit?  No      Plan:     I have personally reviewed and noted the following in the patient's chart:   Medical and social history Use of alcohol, tobacco or illicit drugs  Current medications and supplements including opioid prescriptions. Patient is not currently taking opioid prescriptions. Functional ability and status Nutritional status Physical activity Advanced directives List of other physicians Hospitalizations, surgeries, and ER visits in previous 12 months Vitals Screenings to include cognitive, depression, and falls Referrals and appointments  In addition, I have reviewed and discussed with patient certain preventive protocols, quality metrics, and best practice recommendations. A written personalized care plan for preventive services as well as general preventive health recommendations were provided to patient.     Lorrene Reid, LPN   07/25/1094   Nurse Notes: No Vaccines on file

## 2023-03-04 ENCOUNTER — Ambulatory Visit: Payer: Medicare Other | Admitting: Nurse Practitioner

## 2023-03-15 ENCOUNTER — Ambulatory Visit (INDEPENDENT_AMBULATORY_CARE_PROVIDER_SITE_OTHER): Payer: Medicare Other

## 2023-03-15 ENCOUNTER — Encounter: Payer: Self-pay | Admitting: Nurse Practitioner

## 2023-03-15 ENCOUNTER — Ambulatory Visit (INDEPENDENT_AMBULATORY_CARE_PROVIDER_SITE_OTHER): Payer: Medicare Other | Admitting: Nurse Practitioner

## 2023-03-15 VITALS — BP 175/95 | HR 96 | Temp 97.3°F | Resp 20 | Ht 70.0 in | Wt 316.0 lb

## 2023-03-15 DIAGNOSIS — Z789 Other specified health status: Secondary | ICD-10-CM

## 2023-03-15 DIAGNOSIS — I10 Essential (primary) hypertension: Secondary | ICD-10-CM | POA: Diagnosis not present

## 2023-03-15 DIAGNOSIS — M47816 Spondylosis without myelopathy or radiculopathy, lumbar region: Secondary | ICD-10-CM | POA: Diagnosis not present

## 2023-03-15 DIAGNOSIS — Z981 Arthrodesis status: Secondary | ICD-10-CM | POA: Diagnosis not present

## 2023-03-15 DIAGNOSIS — E782 Mixed hyperlipidemia: Secondary | ICD-10-CM

## 2023-03-15 DIAGNOSIS — E039 Hypothyroidism, unspecified: Secondary | ICD-10-CM

## 2023-03-15 DIAGNOSIS — R351 Nocturia: Secondary | ICD-10-CM

## 2023-03-15 DIAGNOSIS — E8881 Metabolic syndrome: Secondary | ICD-10-CM

## 2023-03-15 DIAGNOSIS — M545 Low back pain, unspecified: Secondary | ICD-10-CM

## 2023-03-15 DIAGNOSIS — M549 Dorsalgia, unspecified: Secondary | ICD-10-CM | POA: Diagnosis not present

## 2023-03-15 DIAGNOSIS — E559 Vitamin D deficiency, unspecified: Secondary | ICD-10-CM

## 2023-03-15 DIAGNOSIS — M79641 Pain in right hand: Secondary | ICD-10-CM

## 2023-03-15 DIAGNOSIS — N401 Enlarged prostate with lower urinary tract symptoms: Secondary | ICD-10-CM

## 2023-03-15 LAB — LIPID PANEL
Chol/HDL Ratio: 4.1 ratio (ref 0.0–5.0)
Cholesterol, Total: 163 mg/dL (ref 100–199)
HDL: 40 mg/dL (ref >39)
LDL Chol Calc (NIH): 94 mg/dL (ref 0–99)
Triglycerides: 165 mg/dL — ABNORMAL HIGH (ref 0–149)
VLDL Cholesterol Cal: 29 mg/dL (ref 5–40)

## 2023-03-15 LAB — CBC WITH DIFFERENTIAL/PLATELET
Basophils Absolute: 0.1 10*3/uL (ref 0.0–0.2)
Basos: 1 %
EOS (ABSOLUTE): 0.3 10*3/uL (ref 0.0–0.4)
Eos: 6 %
Hematocrit: 46.8 % (ref 37.5–51.0)
Hemoglobin: 15.4 g/dL (ref 13.0–17.7)
Immature Grans (Abs): 0 10*3/uL (ref 0.0–0.1)
Immature Granulocytes: 0 %
Lymphocytes Absolute: 1.8 10*3/uL (ref 0.7–3.1)
Lymphs: 32 %
MCH: 29.7 pg (ref 26.6–33.0)
MCHC: 32.9 g/dL (ref 31.5–35.7)
MCV: 90 fL (ref 79–97)
Monocytes Absolute: 0.5 10*3/uL (ref 0.1–0.9)
Monocytes: 9 %
Neutrophils Absolute: 3 10*3/uL (ref 1.4–7.0)
Neutrophils: 52 %
Platelets: 217 10*3/uL (ref 150–450)
RBC: 5.19 x10E6/uL (ref 4.14–5.80)
RDW: 13.7 % (ref 11.6–15.4)
WBC: 5.7 10*3/uL (ref 3.4–10.8)

## 2023-03-15 LAB — CMP14+EGFR
ALT: 28 IU/L (ref 0–44)
AST: 30 IU/L (ref 0–40)
Albumin: 4.3 g/dL (ref 3.8–4.8)
Alkaline Phosphatase: 59 IU/L (ref 44–121)
BUN/Creatinine Ratio: 13 (ref 10–24)
BUN: 15 mg/dL (ref 8–27)
Bilirubin Total: 0.4 mg/dL (ref 0.0–1.2)
CO2: 22 mmol/L (ref 20–29)
Calcium: 9.3 mg/dL (ref 8.6–10.2)
Chloride: 103 mmol/L (ref 96–106)
Creatinine, Ser: 1.17 mg/dL (ref 0.76–1.27)
Globulin, Total: 2.3 g/dL (ref 1.5–4.5)
Glucose: 111 mg/dL — ABNORMAL HIGH (ref 70–99)
Potassium: 4.2 mmol/L (ref 3.5–5.2)
Sodium: 140 mmol/L (ref 134–144)
Total Protein: 6.6 g/dL (ref 6.0–8.5)
eGFR: 66 mL/min/{1.73_m2} (ref >59)

## 2023-03-15 LAB — BAYER DCA HB A1C WAIVED: HB A1C (BAYER DCA - WAIVED): 5.7 % — ABNORMAL HIGH (ref 4.8–5.6)

## 2023-03-15 MED ORDER — LISINOPRIL 20 MG PO TABS: 20.0000 mg | ORAL_TABLET | Freq: Every day | ORAL | 3 refills | Status: DC

## 2023-03-15 MED ORDER — LEVOTHYROXINE SODIUM 50 MCG PO TABS
50.0000 ug | ORAL_TABLET | Freq: Every day | ORAL | 1 refills | Status: DC
Start: 2023-03-15 — End: 2023-07-30

## 2023-03-15 NOTE — Progress Notes (Signed)
Subjective:    Patient ID: Seth Martinez, male    DOB: 1950-08-20, 72 y.o.   MRN: [id number]   Chief Complaint: Medical Management of Chronic Issues    HPI:  Seth Martinez is a 72 y.o. who identifies as a male who was assigned male at birth.   Social history: Lives with: wife Work history: retired   Water engineer in today for follow up of the following chronic medical issues:  1. Primary hypertension No c/o chest pain, sob or headache. Does not check blood pressure at home. BP Readings from Last 3 Encounters:  03/15/23 (!) 175/95  09/03/22 (!) 142/86  07/15/22 (!) 140/85     2. Mixed hyperlipidemia Does not watch diet and does no exercise. Statin intolerance Lab Results  Component Value Date   CHOL 169 09/03/2022   HDL 40 09/03/2022   LDLCALC 101 (H) 09/03/2022   TRIG 161 (H) 09/03/2022   CHOLHDL 4.2 09/03/2022     3. Metabolic syndrome Does not check blood sugar at home Lab Results  Component Value Date   HGBA1C 6.2 (H) 09/03/2022     4. Acquired hypothyroidism No issues that he is aware of. Lab Results  Component Value Date   TSH 3.280 03/30/2022     5. Statin intolerance  6. Vitamin D deficiency Is on daily vitamin d supplement  7. Benign prostatic hyperplasia with nocturia No voiding issues  8. Morbid obesity (HCC) No recent weight changes Wt Readings from Last 3 Encounters:  03/15/23 (!) 316 lb (143.3 kg)  12/25/22 (!) 315 lb (142.9 kg)  09/03/22 (!) 315 lb (142.9 kg)   BMI Readings from Last 3 Encounters:  03/15/23 45.34 kg/m  12/25/22 45.20 kg/m  09/03/22 43.93 kg/m      New complaints: Several complaints today: - patient says he went to pick up a brick from yard and injured his left shoulder- had a burning sensation for a few days but is better. - worries about his right hand. Has numbness in pinky finger and ring finger. The numbness goes all the way down to palm. He has appt with DR. Roxanne Mins on OCT 4th. - low back  pain- had back surgery several years ago. Low back pain rates 8/10 when laying down. Doe snot bother him during the day.  Allergies  Allergen Reactions   Morphine And Codeine Anaphylaxis   Penicillins Anaphylaxis    Has patient had a PCN reaction causing immediate rash, facial/tongue/throat swelling, SOB or lightheadedness with hypotension: Yes Has patient had a PCN reaction causing severe rash involving mucus membranes or skin necrosis: No Has patient had a PCN reaction that required hospitalization No Has patient had a PCN reaction occurring within the last 10 years: No If all of the above answers are "NO", then may proceed with Cephalosporin use.   Vancomycin Rash and Other (See Comments)    Vasculitis, Red Man's Syndrome   Amitriptyline Other (See Comments)    Unknown   Antara [Fenofibrate] Other (See Comments)    Unknown   Bextra [Valdecoxib] Other (See Comments)    Unknown   Cefdinir Other (See Comments)    Unknown   Celebrex [Celecoxib] Other (See Comments)    Unknown   Crestor [Rosuvastatin Calcium] Other (See Comments)    Unknown   Escitalopram Oxalate Other (See Comments)    Unknown   Fish Oil Other (See Comments)    Unknown   Ibuprofen Swelling   Naproxen Sodium Other (See Comments)    Unknown  Neurontin [Gabapentin] Other (See Comments)    Unknown   Skelaxin Other (See Comments)    Unknown   Ultram [Tramadol] Other (See Comments)    Unknown   Vioxx [Rofecoxib] Other (See Comments)    Unknown   Zocor [Simvastatin] Other (See Comments)    Unknown   Outpatient Encounter Medications as of 03/15/2023  Medication Sig   aspirin 325 MG tablet Take 325 mg by mouth daily.   clotrimazole-betamethasone (LOTRISONE) cream Apply 1 application topically daily.   EPINEPHrine (EPI-PEN) 0.3 mg/0.3 mL SOAJ injection Inject 0.3 mLs (0.3 mg total) into the muscle once.   fluticasone (FLONASE) 50 MCG/ACT nasal spray Place 2 sprays into both nostrils daily.   levothyroxine  (SYNTHROID) 50 MCG tablet Take 1 tablet (50 mcg total) by mouth daily.   ramipril (ALTACE) 5 MG capsule Take 1 capsule (5 mg total) by mouth daily.   No facility-administered encounter medications on file as of 03/15/2023.    Past Surgical History:  Procedure Laterality Date   BACK SURGERY  1992   lumb fusion   ELBOW SURGERY  1965   right-fx   fix screws in sacrum  in office  1993   fusion lt sacrum with screws  1993   HERNIA REPAIR     bilat ing 29 weeks old   I & D EXTREMITY Right 06/23/2013   Procedure: IRRIGATION AND DEBRIDEMENT EXTREMITY;  Surgeon: Dominica Severin, MD;  Location: [facility];  Service: Orthopedics;  Laterality: Right;   INGUINAL HERNIA REPAIR     bilat age 72   KNEE ARTHROSCOPY Left 05/04/2013   Procedure: ARTHROSCOPY KNEE WITH WASHOUT;  Surgeon: Sheral Apley, MD;  Location: [facility];  Service: Orthopedics;  Laterality: Left;   ORIF CLAVICLE FRACTURE  1956   left-age 72   ORIF FIBULA FRACTURE  1956   lt-accident age 72   ORIF PELVIC FRACTURE  1956   age 72   ORIF RADIUS & ULNA FRACTURES  1956   left-age 72   rt knee arthroscopic  07/2006   TEE WITHOUT CARDIOVERSION N/A 05/04/2013   Procedure: TRANSESOPHAGEAL ECHOCARDIOGRAM (TEE);  Surgeon: Donato Schultz, MD;  Location: [facility] ENDOSCOPY;  Service: Cardiovascular;  Laterality: N/A;   TENDON REPAIR Right 06/23/2013   Procedure: RIGHT INDEX FINGER AND PALM IRRIGATION AND DEBRIDEMENT FLEXOR  TENOSYNOVECTOMY BX AND REPAIR AS NECESSARY ;  Surgeon: Dominica Severin, MD;  Location: [facility];  Service: Orthopedics;  Laterality: Right;    Family History  Problem Relation Age of Onset   Stroke Father    Diabetes type II Brother       Controlled substance contract: n/a     Review of Systems  Constitutional:  Negative for diaphoresis.  Eyes:  Negative for pain.  Respiratory:  Negative for shortness of breath.   Cardiovascular:  Negative for chest pain, palpitations and leg swelling.   Gastrointestinal:  Negative for abdominal pain.  Endocrine: Negative for polydipsia.  Musculoskeletal:  Positive for back pain.  Skin:  Negative for rash.  Neurological:  Negative for dizziness, weakness and headaches.  Hematological:  Does not bruise/bleed easily.  All other systems reviewed and are negative.      Objective:   Physical Exam Vitals and nursing note reviewed.  Constitutional:      Appearance: Normal appearance. He is well-developed.  HENT:     Head: Normocephalic.     Nose: Nose normal.     Mouth/Throat:     Mouth: Mucous membranes are  moist.     Pharynx: Oropharynx is clear.  Eyes:     Pupils: Pupils are equal, round, and reactive to light.  Neck:     Thyroid: No thyroid mass or thyromegaly.     Vascular: No carotid bruit or JVD.     Trachea: Phonation normal.  Cardiovascular:     Rate and Rhythm: Normal rate and regular rhythm.  Pulmonary:     Effort: Pulmonary effort is normal. No respiratory distress.     Breath sounds: Normal breath sounds.  Abdominal:     General: Bowel sounds are normal.     Palpations: Abdomen is soft.     Tenderness: There is no abdominal tenderness.  Musculoskeletal:        General: Normal range of motion.     Cervical back: Normal range of motion and neck supple.  Lymphadenopathy:     Cervical: No cervical adenopathy.  Skin:    General: Skin is warm and dry.  Neurological:     Mental Status: He is alert and oriented to person, place, and time.  Psychiatric:        Behavior: Behavior normal.        Thought Content: Thought content normal.        Judgment: Judgment normal.      BP (!) 175/95   Pulse 96   Temp (!) 97.3 F (36.3 C) (Temporal)   Resp 20   Ht 5\' 10"  (1.778 m)   Wt (!) 316 lb (143.3 kg)   SpO2 95%   BMI 45.34 kg/m   Hgba1c 5.7% Lumbar spine xray- radiology report pending     Assessment & Plan:   RAIYAN DORRY comes in today with chief complaint of Medical Management of Chronic  Issues   Diagnosis and orders addressed:  1. Primary hypertension Changed blood pressure meds to lisinopril 20 - CBC with Differential/Platelet - CMP14+EGFR - lisinopril (ZESTRIL) 20 MG tablet; Take 1 tablet (20 mg total) by mouth daily.  Dispense: 90 tablet; Refill: 3  2. Mixed hyperlipidemia Low fat diet - Lipid panel  3. Metabolic syndrome Continue to watch carbs in diet - Bayer DCA Hb A1c Waived - Microalbumin / creatinine urine ratio  4. Acquired hypothyroidism Labs pending - levothyroxine (SYNTHROID) 50 MCG tablet; Take 1 tablet (50 mcg total) by mouth daily.  Dispense: 90 tablet; Refill: 1  5. Statin intolerance Strict low fat diet  6. Vitamin D deficiency Continue daily vitamin d supplement  7. Benign prostatic hyperplasia with nocturia Report any voiding issues  8. Morbid obesity (HCC) Discussed diet and exercise for person with BMI >25 Will recheck weight in 3-6 months   9. Acute midline low back pain without sciatica Exercise daily Daily stretches Waiting on radiology report - DG Lumbar Spine 2-3 Views  10. Pain of right hand Keep appt with DR Roxanne Mins   Labs pending Health Maintenance reviewed Diet and exercise encouraged  Follow up plan: 6 months   Mary-Margaret Daphine Deutscher, FNP

## 2023-03-15 NOTE — Patient Instructions (Signed)
Back Exercises These exercises help to make your trunk and back strong. They also help to keep the lower back flexible. Doing these exercises can help to prevent or lessen pain in your lower back. If you have back pain, try to do these exercises 2-3 times each day or as told by your doctor. As you get better, do the exercises once each day. Repeat the exercises more often as told by your doctor. To stop back pain from coming back, do the exercises once each day, or as told by your doctor. Do exercises exactly as told by your doctor. Stop right away if you feel sudden pain or your pain gets worse. Exercises Single knee to chest Do these steps 3-5 times in a row for each leg: Lie on your back on a firm bed or the floor with your legs stretched out. Bring one knee to your chest. Grab your knee or thigh with both hands and hold it in place. Pull on your knee until you feel a gentle stretch in your lower back or butt. Keep doing the stretch for 10-30 seconds. Slowly let go of your leg and straighten it. Pelvic tilt Do these steps 5-10 times in a row: Lie on your back on a firm bed or the floor with your legs stretched out. Bend your knees so they point up to the ceiling. Your feet should be flat on the floor. Tighten your lower belly (abdomen) muscles to press your lower back against the floor. This will make your tailbone point up to the ceiling instead of pointing down to your feet or the floor. Stay in this position for 5-10 seconds while you gently tighten your muscles and breathe evenly. Cat-cow Do these steps until your lower back bends more easily: Get on your hands and knees on a firm bed or the floor. Keep your hands under your shoulders, and keep your knees under your hips. You may put padding under your knees. Let your head hang down toward your chest. Tighten (contract) the muscles in your belly. Point your tailbone toward the floor so your lower back becomes rounded like the back of a  cat. Stay in this position for 5 seconds. Slowly lift your head. Let the muscles of your belly relax. Point your tailbone up toward the ceiling so your back forms a sagging arch like the back of a cow. Stay in this position for 5 seconds.  Press-ups Do these steps 5-10 times in a row: Lie on your belly (face-down) on a firm bed or the floor. Place your hands near your head, about shoulder-width apart. While you keep your back relaxed and keep your hips on the floor, slowly straighten your arms to raise the top half of your body and lift your shoulders. Do not use your back muscles. You may change where you place your hands to make yourself more comfortable. Stay in this position for 5 seconds. Keep your back relaxed. Slowly return to lying flat on the floor.  Bridges Do these steps 10 times in a row: Lie on your back on a firm bed or the floor. Bend your knees so they point up to the ceiling. Your feet should be flat on the floor. Your arms should be flat at your sides, next to your body. Tighten your butt muscles and lift your butt off the floor until your waist is almost as high as your knees. If you do not feel the muscles working in your butt and the back of   your thighs, slide your feet 1-2 inches (2.5-5 cm) farther away from your butt. Stay in this position for 3-5 seconds. Slowly lower your butt to the floor, and let your butt muscles relax. If this exercise is too easy, try doing it with your arms crossed over your chest. Belly crunches Do these steps 5-10 times in a row: Lie on your back on a firm bed or the floor with your legs stretched out. Bend your knees so they point up to the ceiling. Your feet should be flat on the floor. Cross your arms over your chest. Tip your chin a little bit toward your chest, but do not bend your neck. Tighten your belly muscles and slowly raise your chest just enough to lift your shoulder blades a tiny bit off the floor. Avoid raising your body  higher than that because it can put too much stress on your lower back. Slowly lower your chest and your head to the floor. Back lifts Do these steps 5-10 times in a row: Lie on your belly (face-down) with your arms at your sides, and rest your forehead on the floor. Tighten the muscles in your legs and your butt. Slowly lift your chest off the floor while you keep your hips on the floor. Keep the back of your head in line with the curve in your back. Look at the floor while you do this. Stay in this position for 3-5 seconds. Slowly lower your chest and your face to the floor. Contact a doctor if: Your back pain gets a lot worse when you do an exercise. Your back pain does not get better within 2 hours after you exercise. If you have any of these problems, stop doing the exercises. Do not do them again unless your doctor says it is okay. Get help right away if: You have sudden, very bad back pain. If this happens, stop doing the exercises. Do not do them again unless your doctor says it is okay. This information is not intended to replace advice given to you by your health care provider. Make sure you discuss any questions you have with your health care provider. Document Revised: 09/18/2020 Document Reviewed: 09/18/2020 Elsevier Patient Education  2024 [employer]vier Inc.  

## 2023-04-02 ENCOUNTER — Other Ambulatory Visit: Payer: Self-pay | Admitting: Nurse Practitioner

## 2023-04-02 ENCOUNTER — Telehealth: Payer: Self-pay | Admitting: Nurse Practitioner

## 2023-04-02 MED ORDER — PREDNISONE 10 MG (21) PO TBPK: ORAL_TABLET | ORAL | 0 refills | Status: DC

## 2023-04-02 NOTE — Telephone Encounter (Signed)
Prednisone sent to pharmacy per PCP. Patients wife notified and verbalized understanding

## 2023-04-02 NOTE — Progress Notes (Signed)
Patient calls and is still having back pain that was discussed at his appointment on 03/15/23. Pain is around 8/10. Worse when laying down. Radiates occasionally.   Meds ordered this encounter  Medications   predniSONE (STERAPRED UNI-PAK 21 TAB) 10 MG (21) TBPK tablet    Sig: As directed x 6 days    Dispense:  21 tablet    Refill:  0    Order Specific Question:   Supervising Provider    Answer:   Nils Pyle [[id number]]   Mary-Margaret Daphine Deutscher, FNP

## 2023-04-02 NOTE — Telephone Encounter (Signed)
NTBS to discuss

## 2023-04-02 NOTE — Telephone Encounter (Signed)
  Incoming Patient Call  04/02/2023  What symptoms do you have? Back problems  How long have you been sick? Going on 6 weeks  Have you been seen for this problem? Yes on 03/15/2023   If your provider decides to give you a prescription, which pharmacy would you like for it to be sent to? Pt wife requesting prednisone and this usually helps get the inflammation down pt [facility] [address]  Patient informed that this information will be sent to the clinical staff for review and that they should receive a follow up call.

## 2023-04-16 DIAGNOSIS — G5621 Lesion of ulnar nerve, right upper limb: Secondary | ICD-10-CM | POA: Diagnosis not present

## 2023-04-16 DIAGNOSIS — M25531 Pain in right wrist: Secondary | ICD-10-CM | POA: Diagnosis not present

## 2023-04-16 DIAGNOSIS — M79641 Pain in right hand: Secondary | ICD-10-CM | POA: Diagnosis not present

## 2023-04-16 DIAGNOSIS — M25521 Pain in right elbow: Secondary | ICD-10-CM | POA: Diagnosis not present

## 2023-04-18 DIAGNOSIS — G5621 Lesion of ulnar nerve, right upper limb: Secondary | ICD-10-CM | POA: Diagnosis not present

## 2023-04-23 ENCOUNTER — Other Ambulatory Visit: Payer: Self-pay | Admitting: Nurse Practitioner

## 2023-04-23 ENCOUNTER — Ambulatory Visit (INDEPENDENT_AMBULATORY_CARE_PROVIDER_SITE_OTHER): Payer: Medicare Other | Admitting: Nurse Practitioner

## 2023-04-23 ENCOUNTER — Encounter: Payer: Self-pay | Admitting: Nurse Practitioner

## 2023-04-23 VITALS — BP 159/92 | HR 78 | Temp 97.5°F | Resp 20 | Ht 70.0 in | Wt 316.0 lb

## 2023-04-23 DIAGNOSIS — M545 Low back pain, unspecified: Secondary | ICD-10-CM | POA: Diagnosis not present

## 2023-04-23 DIAGNOSIS — Z1212 Encounter for screening for malignant neoplasm of rectum: Secondary | ICD-10-CM

## 2023-04-23 DIAGNOSIS — Z1211 Encounter for screening for malignant neoplasm of colon: Secondary | ICD-10-CM

## 2023-04-23 DIAGNOSIS — G8929 Other chronic pain: Secondary | ICD-10-CM | POA: Diagnosis not present

## 2023-04-23 MED ORDER — HYDROCODONE-ACETAMINOPHEN 5-325 MG PO TABS
1.0000 | ORAL_TABLET | Freq: Two times a day (BID) | ORAL | 0 refills | Status: DC | PRN
Start: 2023-04-23 — End: 2023-04-27

## 2023-04-23 NOTE — Progress Notes (Signed)
Subjective:    Patient ID: Seth Martinez, male    DOB: August 10, 1950, 72 y.o.   MRN: [id number]   Chief Complaint: Pain and Shoulder Pain (Left/)   Shoulder Pain     Patient in c/o chronic pain. He says his back hurts all the time and now his shoulder is hurting. Rates pain 9/10 today. He takes tylenol OTC which does not help. He use to be on oxycodone 5/325 only as needed. He says he doe snot need pain meds every day.  [place] Controlled Substance Abuse database reviewed- Yes If yes- were their any concerning findings : no      04/23/2023    9:05 AM 03/15/2023   10:15 AM 12/25/2022    3:33 PM 09/03/2022   10:52 AM 07/15/2022    3:35 PM  Depression screen PHQ 2/9  Decreased Interest 1 1 0 0 0  Down, Depressed, Hopeless 1 1 0 0 0  PHQ - 2 Score 2 2 0 0 0  Altered sleeping 2 2  1  0  Tired, decreased energy 1 1  1  0  Change in appetite 0 0  0 0  Feeling bad or failure about yourself  0 0  0 0  Trouble concentrating 0 0  0 0  Moving slowly or fidgety/restless 0 0  0 0  Suicidal thoughts 0 0  0 0  PHQ-9 Score 5 5  2  0  Difficult doing work/chores Somewhat difficult Somewhat difficult  Somewhat difficult Not difficult at all       04/23/2023    9:05 AM 03/15/2023   10:15 AM 09/03/2022   10:52 AM 07/15/2022    3:36 PM  GAD 7 : Generalized Anxiety Score  Nervous, Anxious, on Edge 0 0 0 0  Control/stop worrying 0 0 0 0  Worry too much - different things 0 0 0 0  Trouble relaxing 0 0 0 0  Restless 0 0 0 0  Easily annoyed or irritable 0 0 0 0  Afraid - awful might happen 0 0 0 0  Total GAD 7 Score 0 0 0 0  Anxiety Difficulty Not difficult at all Not difficult at all Not difficult at all Not difficult at all       [employer] drug screen performed- Yes  SOAPP  0= never  1= seldom  2=sometimes  3= often  4= very often  How often do you have mood swings? 0 How often do you smoke a cigarette within an hour after waling up? 0 How often have you taken medication  other than the way that it was prescribed?0 How often have you used illegal drugs in the past 5 years? 0 How often, in your lifetime, have you had legal problems or been arrested? 0  Score 0  Alcohol Audit - How often during the last year have found that you: 0-Never   1- Less than monthly   2- Monthly     3-Weekly     4-daily or almost daily  - found that you were not able to stop drinking once you started- 0 -failed to do what was normally expected of you because of drinking- 0 -needed a first drink in the morning- 0 -had a feeling of guilt or remorse after drinking- 0 -are/were unable to remember what happened the night before because of your drinking- 0  0- NO   2- yes but not in last year  4- yes during last year -Have you or someone else  been injured because of your drinking- 0 - Has anyone been concerned about your drinking or suggested you cut down- 0        TOTAL- 0  ( 0-7- alcohol education, 8-15- simple advice, 16-19 simple advice plus counseling, 20-40 referral for evaluation and treatment )      No data to display           Designated Pharmacy- [facility]  Pain assessment: Cause of pain- DDD- has had back surgery in the past Pain location- lower back and left shoulder Pain on scale of 1-10- 8/10 Frequency- daily What increases pain-to much activity What makes pain Better-rest helps sligthly Effects on ADL - none  Prior treatments tried and failed- tylenol and aspirin Current opioids rx- none # prescribed- 0 Morphine mg equivalent- 0  Pain management agreement reviewed and signed- Yes  Patient Active Problem List   Diagnosis Date Noted   Acquired hypothyroidism 12/19/2021   Chronic bilateral low back pain without sciatica 07/28/2019   Benign prostatic hyperplasia 12/26/2014   Metabolic syndrome 09/28/2013   Statin intolerance 09/28/2013   Morbid obesity (HCC) 05/04/2013   Septic arthritis of multiple joints, history of 05/02/2013   Hyperlipemia  12/28/2012   Hypertension 12/28/2012   Vitamin D deficiency 12/28/2012       Review of Systems  Musculoskeletal:  Positive for arthralgias and back pain.       Objective:   Physical Exam Constitutional:      Appearance: Normal appearance. He is obese.  Cardiovascular:     Rate and Rhythm: Normal rate and regular rhythm.     Heart sounds: Normal heart sounds.  Pulmonary:     Effort: Pulmonary effort is normal.     Breath sounds: Normal breath sounds.  Musculoskeletal:     Comments: Rises slowly from sitting to standing FROM of lumbar spine with pain on rotation (-) SLR bil Motor strength and sensation distally intact  Pain in left shoulder on palpation Decreased ROM with pain on abduction and internal rotation. Grips equal bil  Skin:    General: Skin is warm.  Neurological:     General: No focal deficit present.     Mental Status: He is alert and oriented to person, place, and time.  Psychiatric:        Mood and Affect: Mood normal.        Behavior: Behavior normal.     BP (!) 159/92   Pulse 78   Temp (!) 97.5 F (36.4 C) (Temporal)   Resp 20   Ht 5\' 10"  (1.778 m)   Wt (!) 316 lb (143.3 kg)   SpO2 94%   BMI 45.34 kg/m        Assessment & Plan:   Seth Martinez in today with chief complaint of Pain and Shoulder Pain (Left/)   1. Chronic bilateral low back pain without sciatica Moist heat Rest Only take pain meds as needed - HYDROcodone-acetaminophen (NORCO/VICODIN) 5-325 MG tablet; Take 1 tablet by mouth 2 (two) times daily as needed for moderate pain.  Dispense: 30 tablet; Refill: 0  2. Shoulder pain Will make appt with ortho top discuss  The above assessment and management plan was discussed with the patient. The patient verbalized understanding of and has agreed to the management plan. Patient is aware to call the clinic if symptoms persist or worsen. Patient is aware when to return to the clinic for a follow-up visit. Patient educated on  when it is appropriate to go  to the emergency department.   Mary-Margaret Daphine Deutscher, FNP

## 2023-04-23 NOTE — Patient Instructions (Signed)

## 2023-04-27 ENCOUNTER — Encounter: Payer: Self-pay | Admitting: Nurse Practitioner

## 2023-04-27 ENCOUNTER — Ambulatory Visit (INDEPENDENT_AMBULATORY_CARE_PROVIDER_SITE_OTHER): Payer: Medicare Other | Admitting: Nurse Practitioner

## 2023-04-27 VITALS — BP 151/92 | HR 70 | Temp 97.9°F | Resp 20 | Ht 70.0 in | Wt 313.0 lb

## 2023-04-27 DIAGNOSIS — G8929 Other chronic pain: Secondary | ICD-10-CM

## 2023-04-27 DIAGNOSIS — M545 Low back pain, unspecified: Secondary | ICD-10-CM | POA: Diagnosis not present

## 2023-04-27 MED ORDER — OXYCODONE-ACETAMINOPHEN 5-325 MG PO TABS: 1.0000 | ORAL_TABLET | Freq: Two times a day (BID) | ORAL | 0 refills | Status: DC

## 2023-04-27 NOTE — Progress Notes (Signed)
   Subjective:    Patient ID: Seth Martinez, male    DOB: May 05, 1951, 72 y.o.   MRN: [id number]   Chief Complaint: discuss meds   HPI Patient was given hydrocodone for his back pain several days ago. He come sin today stating that he cannot take codiene. This was not on his chart several days ago, was added by nurse today. He says his mom was allergic to this and he thinks he is to. He says since he has been taking it his breathing has slowed down and he is itching, but no rash. He is still experiencing back pain.      Patient Active Problem List   Diagnosis Date Noted   Acquired hypothyroidism 12/19/2021   Chronic bilateral low back pain without sciatica 07/28/2019   Benign prostatic hyperplasia 12/26/2014   Metabolic syndrome 09/28/2013   Statin intolerance 09/28/2013   Morbid obesity (HCC) 05/04/2013   Septic arthritis of multiple joints, history of 05/02/2013   Hyperlipemia 12/28/2012   Hypertension 12/28/2012   Vitamin D deficiency 12/28/2012       Review of Systems  Constitutional:  Negative for diaphoresis.  Eyes:  Negative for pain.  Respiratory:  Negative for shortness of breath.   Cardiovascular:  Negative for chest pain, palpitations and leg swelling.  Gastrointestinal:  Negative for abdominal pain.  Endocrine: Negative for polydipsia.  Musculoskeletal:  Positive for back pain.  Skin:  Negative for rash.  Neurological:  Negative for dizziness, weakness and headaches.  Hematological:  Does not bruise/bleed easily.  All other systems reviewed and are negative.      Objective:   Physical Exam  No physical assessmnt today      Assessment & Plan:  Seth Martinez in today with chief complaint of discuss meds   1. Chronic bilateral low back pain without sciatica Changed hydrocodone to oxycodone 5/325.     The above assessment and management plan was discussed with the patient. The patient verbalized understanding of and has agreed to the  management plan. Patient is aware to call the clinic if symptoms persist or worsen. Patient is aware when to return to the clinic for a follow-up visit. Patient educated on when it is appropriate to go to the emergency department.   Mary-Margaret Daphine Deutscher, FNP

## 2023-04-28 LAB — TOXASSURE SELECT 13 (MW), URINE

## 2023-05-10 DIAGNOSIS — Z0279 Encounter for issue of other medical certificate: Secondary | ICD-10-CM

## 2023-05-12 DIAGNOSIS — M25512 Pain in left shoulder: Secondary | ICD-10-CM | POA: Diagnosis not present

## 2023-05-14 DIAGNOSIS — G5601 Carpal tunnel syndrome, right upper limb: Secondary | ICD-10-CM | POA: Diagnosis not present

## 2023-05-14 DIAGNOSIS — G5621 Lesion of ulnar nerve, right upper limb: Secondary | ICD-10-CM | POA: Diagnosis not present

## 2023-05-19 DIAGNOSIS — G5621 Lesion of ulnar nerve, right upper limb: Secondary | ICD-10-CM | POA: Diagnosis not present

## 2023-05-19 DIAGNOSIS — M62541 Muscle wasting and atrophy, not elsewhere classified, right hand: Secondary | ICD-10-CM | POA: Diagnosis not present

## 2023-05-19 DIAGNOSIS — G5601 Carpal tunnel syndrome, right upper limb: Secondary | ICD-10-CM | POA: Diagnosis not present

## 2023-05-19 DIAGNOSIS — M25512 Pain in left shoulder: Secondary | ICD-10-CM | POA: Diagnosis not present

## 2023-05-26 ENCOUNTER — Other Ambulatory Visit: Payer: Self-pay | Admitting: Nurse Practitioner

## 2023-05-26 DIAGNOSIS — M25512 Pain in left shoulder: Secondary | ICD-10-CM | POA: Diagnosis not present

## 2023-05-26 NOTE — Telephone Encounter (Signed)
Copied from CRM [id number]. Topic: Clinical - Medication Refill >> May 26, 2023  1:44 PM Prudencio Pair wrote: Most Recent Primary Care Visit:  Provider: Bennie Pierini  Department: [facility] MED  Visit Type: OFFICE VISIT  Date: 04/27/2023  Medication: ***  Has the patient contacted their pharmacy?  (Agent: If no, request that the patient contact the pharmacy for the refill. If patient does not wish to contact the pharmacy document the reason why and proceed with request.) (Agent: If yes, when and what did the pharmacy advise?)  Is this the correct pharmacy for this prescription?  If no, delete pharmacy and type the correct one.  This is the patient's preferred pharmacy:  [id number] [place], [place] - [address] [place] [address] Phone: [phone] Fax: [phone]   Has the prescription been filled recently?   Is the patient out of the medication?   Has the patient been seen for an appointment in the last year OR does the patient have an upcoming appointment?   Can we respond through MyChart?   Agent: Please be advised that Rx refills may take up to 3 business days. We ask that you follow-up with your pharmacy.

## 2023-05-28 ENCOUNTER — Ambulatory Visit (INDEPENDENT_AMBULATORY_CARE_PROVIDER_SITE_OTHER): Payer: Medicare Other | Admitting: Nurse Practitioner

## 2023-05-28 ENCOUNTER — Encounter: Payer: Self-pay | Admitting: Nurse Practitioner

## 2023-05-28 VITALS — BP 165/108 | HR 76 | Temp 97.0°F | Resp 20 | Ht 70.0 in | Wt 315.0 lb

## 2023-05-28 DIAGNOSIS — M25512 Pain in left shoulder: Secondary | ICD-10-CM | POA: Diagnosis not present

## 2023-05-28 DIAGNOSIS — I10 Essential (primary) hypertension: Secondary | ICD-10-CM | POA: Diagnosis not present

## 2023-05-28 MED ORDER — OXYCODONE-ACETAMINOPHEN 5-325 MG PO TABS
1.0000 | ORAL_TABLET | Freq: Two times a day (BID) | ORAL | 0 refills | Status: AC
Start: 2023-05-28 — End: 2023-06-27

## 2023-05-28 NOTE — Patient Instructions (Signed)
Cooking With Less Salt Cooking with less salt is one way to reduce the amount of salt (sodium) you get from food. Most people should have less than 2,300 milligrams (mg) of sodium each day. If you have high blood pressure (hypertension), you may need to limit your sodium to 1,500 mg each day. Follow the tips below to help reduce your sodium intake. What are tips for eating less sodium? Reading food labels  Check the food label before buying or using packaged ingredients. Always check the label for the serving size and sodium content. Choose products with less than 140 mg of sodium per serving. Check the % Daily Value column to see what percent of the daily recommended amount of sodium is in one serving of the product. Foods with 5% or less are low in sodium. Foods with 20% or more are high in sodium. Do not choose foods that have salt as one of the first three ingredients on the ingredients list. Always check how much sodium is in a product, even if the label says "unsalted" or "no salt added." Shopping Buy sodium-free or low-sodium products. Look for these words: Low-sodium. Sodium-free. Reduced-sodium. No salt added. Unsalted. Buy fresh or frozen foods without sauces or additives. Cooking Instead of salt, use herbs, seasonings without salt, and spices. Use sodium-free baking soda. Grill, braise, or roast foods to add flavor with less salt. Do not add salt to pasta, rice, or hot cereals. Drain and rinse canned vegetables, beans, and meat before use. Do not add salt when cooking sweets and desserts. Cook with low-sodium ingredients. Meal planning The sodium in bread can add up. Try to plan meals with other grains. These may include whole oats, quinoa, whole wheat pasta, and other whole grains that do not have sodium added to them. What foods are high in sodium? Vegetables Regular canned vegetables, except low-sodium or reduced-sodium items. Sauerkraut, pickled vegetables, and relishes.  Olives. French fries. Onion rings. Regular canned tomato sauce and paste. Regular tomato and vegetable juice. Frozen vegetables in sauces. Grains Instant hot cereals. Bread stuffing, pancake, and biscuit mixes. Croutons. Seasoned rice or pasta mixes. Noodle soup cups. Boxed or frozen macaroni and cheese. Regular salted crackers. Self-rising flour. Rolls. Bagels. Flour tortillas and wraps. Meats and other proteins Meat or fish that is salted, canned, smoked, cured, spiced, or pickled. Precooked or cured meat, such as sausages or meat loaves. Bacon. Ham. Pepperoni. Hot dogs. Corned beef. Chipped beef. Salt pork. Jerky. Pickled herring, anchovies, and sardines. Regular canned tuna. Salted nuts. Dairy Processed cheese and cheese spreads. Hard cheeses. Cheese curds. Blue cheese. Feta cheese. String cheese. Regular cottage cheese. Buttermilk. Canned milk. The items listed above may not be a full list of foods high in sodium. Talk to a dietitian to learn more. What foods are low in sodium? Fruits Fresh, frozen, or canned fruit with no sauce added. Fruit juice. Vegetables Fresh or frozen vegetables with no sauce added. "No salt added" canned vegetables. "No salt added" tomato sauce and paste. Low-sodium or reduced-sodium tomato and vegetable juice. Grains Noodles, pasta, quinoa, rice. Shredded or puffed wheat or puffed rice. Regular or quick oats (not instant). Low-sodium crackers. Low-sodium bread. Whole grain bread and whole grain pasta. Unsalted popcorn. Meats and other proteins Fresh or frozen whole meats, poultry that has not been injected with sodium, and fish with no sauce added. Unsalted nuts. Dried peas, beans, and lentils without added salt. Unsalted canned beans. Eggs. Unsalted nut butters. Low-sodium canned tuna or chicken. Dairy   Milk. Soy milk. Yogurt. Low-sodium cheeses, such as Swiss, Monterey Jack, mozzarella, and ricotta. Sherbet or ice cream (keep to  cup per serving). Cream  cheese. Fats and oils Unsalted butter or margarine. Other foods Homemade pudding. Sodium-free baking soda and baking powder. Herbs and spices. Low-sodium seasoning mixes. Beverages Coffee and tea. Carbonated beverages. The items listed above may not be a full list of foods low in sodium. Talk to a dietitian to learn more. What are some salt alternatives when cooking? Herbs, seasonings, and spices can be used instead of salt to flavor your food. Herbs should be fresh or dried. Do not choose packaged mixes. Next to the name of the herb, spice, or seasoning below are some foods you can pair it with. Herbs Bay leaves - Soups, meat and vegetable dishes, and spaghetti sauce. Basil - Italian dishes, soups, pasta, and fish dishes. Cilantro - Meat, poultry, and vegetable dishes. Chili powder - Marinades and Mexican dishes. Chives - Salad dressings and potato dishes. Cumin - Mexican dishes, couscous, and meat dishes. Dill - Fish dishes, sauces, and salads. Fennel - Meat and vegetable dishes, breads, and cookies. Garlic (do not use garlic salt) - Italian dishes, meat dishes, salad dressings, and sauces. Marjoram - Soups, potato dishes, and meat dishes. Oregano - Pizza and spaghetti sauce. Parsley - Salads, soups, pasta, and meat dishes. Rosemary - Italian dishes, salad dressings, soups, and red meats. Saffron - Fish dishes, pasta, and some poultry dishes. Sage - Stuffings and sauces. Tarragon - Fish and poultry dishes. Thyme - Stuffing, meat, and fish dishes. Seasonings Lemon juice - Fish dishes, poultry dishes, vegetables, and salads. Vinegar - Salad dressings, vegetables, and fish dishes. Spices Cinnamon - Sweet dishes, such as cakes, cookies, and puddings. Cloves - Gingerbread, puddings, and marinades for meats. Curry - Vegetable dishes, fish and poultry dishes, and stir-fry dishes. Ginger - Vegetable dishes, fish dishes, and stir-fry dishes. Nutmeg - Pasta, vegetables, poultry, fish  dishes, and custard. This information is not intended to replace advice given to you by your health care provider. Make sure you discuss any questions you have with your health care provider. Document Revised: 07/30/2022 Document Reviewed: 07/23/2022 Elsevier Patient Education  2024 Else[employer] Inc.  

## 2023-05-28 NOTE — Progress Notes (Signed)
   Subjective:    Patient ID: Seth Martinez, male    DOB: Feb 18, 1951, 72 y.o.   MRN: [id number]   Chief Complaint: Shoulder Pain (MRI scheduled Thursday/)   Shoulder Pain    Patient is scheduled for an MRI on Thursday for his shoulder pain. Seth Martinez is seeing [facility]. Needs refill on pain meds to make it until Seth Martinez sees them again. Rate [place] 8-10/10.   Patient was switched from ramipril 5 mg to lisinopril 20mg  daily. Seth Martinez is refusing to take lisinopril and wants to stay on ramipril.  The 10-year ASCVD risk score (Arnett DK, et al., 2019) is: 61.5% Patient Active Problem List   Diagnosis Date Noted   Acquired hypothyroidism 12/19/2021   Chronic bilateral low back pain without sciatica 07/28/2019   Benign prostatic hyperplasia 12/26/2014   Metabolic syndrome 09/28/2013   Statin intolerance 09/28/2013   Morbid obesity (HCC) 05/04/2013   Septic arthritis of multiple joints, history of 05/02/2013   Hyperlipemia 12/28/2012   Hypertension 12/28/2012   Vitamin D deficiency 12/28/2012       Review of Systems     Objective:   Physical Exam Constitutional:      Appearance: Normal appearance.  Cardiovascular:     Rate and Rhythm: Normal rate and regular rhythm.     Heart sounds: Normal heart sounds.  Pulmonary:     Effort: Pulmonary effort is normal.     Breath sounds: Normal breath sounds.  Musculoskeletal:     Comments: Limited ROM for left shoulder due to pain Motor strength and sensation distally intact  Skin:    General: Skin is warm.  Neurological:     General: No focal deficit present.     Mental Status: Seth Martinez is alert and oriented to person, place, and time.  Psychiatric:        Mood and Affect: Mood normal.        Behavior: Behavior normal.     BP (!) 165/108   Pulse 76   Temp (!) 97 F (36.1 C) (Temporal)   Resp 20   Ht 5\' 10"  (1.778 m)   Wt (!) 315 lb (142.9 kg)   SpO2 91%   BMI 45.20 kg/m        Assessment & Plan:   [name] in today  with chief complaint of Shoulder Pain (MRI scheduled Thursday/)   1. Acute pain of left shoulder Ice Limited movement Keep follow up with ortho - oxyCODONE-acetaminophen (PERCOCET/ROXICET) 5-325 MG tablet; Take 1 tablet by mouth 2 (two) times daily.  Dispense: 60 tablet; Refill: 0  2. Primary hypertension Seth Martinez has agreed to stay on lisinopril DAILY Dash diet Keep diary of blood pressure at home    The above assessment and management plan was discussed with the patient. The patient verbalized understanding of and has agreed to the management plan. Patient is aware to call the clinic if symptoms persist or worsen. Patient is aware when to return to the clinic for a follow-up visit. Patient educated on when it is appropriate to go to the emergency department.   Mary-Margaret Daphine Deutscher, FNP

## 2023-06-03 DIAGNOSIS — M25512 Pain in left shoulder: Secondary | ICD-10-CM | POA: Diagnosis not present

## 2023-06-08 DIAGNOSIS — M25512 Pain in left shoulder: Secondary | ICD-10-CM | POA: Diagnosis not present

## 2023-06-24 DIAGNOSIS — M19012 Primary osteoarthritis, left shoulder: Secondary | ICD-10-CM | POA: Diagnosis not present

## 2023-06-24 DIAGNOSIS — M75102 Unspecified rotator cuff tear or rupture of left shoulder, not specified as traumatic: Secondary | ICD-10-CM | POA: Diagnosis not present

## 2023-07-30 ENCOUNTER — Other Ambulatory Visit: Payer: Self-pay | Admitting: Nurse Practitioner

## 2023-07-30 DIAGNOSIS — E039 Hypothyroidism, unspecified: Secondary | ICD-10-CM

## 2023-08-03 DIAGNOSIS — R2231 Localized swelling, mass and lump, right upper limb: Secondary | ICD-10-CM | POA: Diagnosis not present

## 2023-08-03 DIAGNOSIS — C44622 Squamous cell carcinoma of skin of right upper limb, including shoulder: Secondary | ICD-10-CM | POA: Diagnosis not present

## 2023-08-03 DIAGNOSIS — M62541 Muscle wasting and atrophy, not elsewhere classified, right hand: Secondary | ICD-10-CM | POA: Diagnosis not present

## 2023-08-03 DIAGNOSIS — G5601 Carpal tunnel syndrome, right upper limb: Secondary | ICD-10-CM | POA: Diagnosis not present

## 2023-08-03 DIAGNOSIS — G5621 Lesion of ulnar nerve, right upper limb: Secondary | ICD-10-CM | POA: Diagnosis not present

## 2023-08-03 DIAGNOSIS — M62531 Muscle wasting and atrophy, not elsewhere classified, right forearm: Secondary | ICD-10-CM | POA: Diagnosis not present

## 2023-08-16 DIAGNOSIS — M25621 Stiffness of right elbow, not elsewhere classified: Secondary | ICD-10-CM | POA: Diagnosis not present

## 2023-08-23 DIAGNOSIS — M25621 Stiffness of right elbow, not elsewhere classified: Secondary | ICD-10-CM | POA: Diagnosis not present

## 2023-08-23 DIAGNOSIS — M25641 Stiffness of right hand, not elsewhere classified: Secondary | ICD-10-CM | POA: Diagnosis not present

## 2023-08-30 DIAGNOSIS — M25631 Stiffness of right wrist, not elsewhere classified: Secondary | ICD-10-CM | POA: Diagnosis not present

## 2023-08-30 DIAGNOSIS — M25641 Stiffness of right hand, not elsewhere classified: Secondary | ICD-10-CM | POA: Diagnosis not present

## 2023-09-10 ENCOUNTER — Ambulatory Visit: Payer: Medicare Other | Admitting: Nurse Practitioner

## 2023-09-10 ENCOUNTER — Encounter: Payer: Self-pay | Admitting: Nurse Practitioner

## 2023-09-10 VITALS — BP 144/90 | HR 97 | Temp 96.9°F | Ht 70.0 in | Wt 315.0 lb

## 2023-09-10 DIAGNOSIS — I10 Essential (primary) hypertension: Secondary | ICD-10-CM | POA: Diagnosis not present

## 2023-09-10 DIAGNOSIS — E782 Mixed hyperlipidemia: Secondary | ICD-10-CM

## 2023-09-10 DIAGNOSIS — N401 Enlarged prostate with lower urinary tract symptoms: Secondary | ICD-10-CM

## 2023-09-10 DIAGNOSIS — E039 Hypothyroidism, unspecified: Secondary | ICD-10-CM | POA: Diagnosis not present

## 2023-09-10 DIAGNOSIS — Z789 Other specified health status: Secondary | ICD-10-CM

## 2023-09-10 DIAGNOSIS — E8881 Metabolic syndrome: Secondary | ICD-10-CM

## 2023-09-10 DIAGNOSIS — R351 Nocturia: Secondary | ICD-10-CM

## 2023-09-10 DIAGNOSIS — E559 Vitamin D deficiency, unspecified: Secondary | ICD-10-CM

## 2023-09-10 LAB — LIPID PANEL

## 2023-09-10 LAB — BAYER DCA HB A1C WAIVED: HB A1C (BAYER DCA - WAIVED): 5.6 % (ref 4.8–5.6)

## 2023-09-10 MED ORDER — OXYCODONE-ACETAMINOPHEN 5-325 MG PO TABS: 1.0000 | ORAL_TABLET | ORAL | 0 refills | Status: AC | PRN

## 2023-09-10 MED ORDER — LISINOPRIL 40 MG PO TABS: 40.0000 mg | ORAL_TABLET | Freq: Every day | ORAL | 1 refills | Status: DC

## 2023-09-10 MED ORDER — LEVOTHYROXINE SODIUM 50 MCG PO TABS: 50.0000 ug | ORAL_TABLET | Freq: Every day | ORAL | 1 refills | Status: DC

## 2023-09-10 NOTE — Progress Notes (Signed)
Subjective:    Patient ID: Seth Martinez, male    DOB: December 31, 1950, 73 y.o.   MRN: [id number]   Chief Complaint: No chief complaint on file.    HPI:  Seth Martinez is a 73 y.o. who identifies as a male who was assigned male at birth.   Social history: Lives with: wife Work history: retired   Water engineer in today for follow up of the following chronic medical issues:  1. Primary hypertension No c/o chest pain, sob or headache. Does check blood pressure at home. At home running around 140's systolic BP Readings from Last 3 Encounters:  05/28/23 (!) 165/108  04/27/23 (!) 151/92  04/23/23 (!) 159/92     2. Mixed hyperlipidemia Does not watch diet and does no exercise. Statin intolerance Lab Results  Component Value Date   CHOL 163 03/15/2023   HDL 40 03/15/2023   LDLCALC 94 03/15/2023   TRIG 165 (H) 03/15/2023   CHOLHDL 4.1 03/15/2023     3. Metabolic syndrome Does not check blood sugar at home Lab Results  Component Value Date   HGBA1C 5.7 (H) 03/15/2023     4. Acquired hypothyroidism No issues that he is aware of. Lab Results  Component Value Date   TSH 3.280 03/30/2022     5. Statin intolerance  6. Vitamin D deficiency Is on daily vitamin d supplement  7. Benign prostatic hyperplasia with nocturia No voiding issues  8. Morbid obesity (HCC) No recent weight changes  Wt Readings from Last 3 Encounters:  09/10/23 (!) 315 lb (142.9 kg)  05/28/23 (!) 315 lb (142.9 kg)  04/27/23 (!) 313 lb (142 kg)   BMI Readings from Last 3 Encounters:  09/10/23 45.20 kg/m  05/28/23 45.20 kg/m  04/27/23 44.91 kg/m       New complaints:  Allergies  Allergen Reactions   Morphine And Codeine Anaphylaxis   Penicillins Anaphylaxis    Has patient had a PCN reaction causing immediate rash, facial/tongue/throat swelling, SOB or lightheadedness with hypotension: Yes Has patient had a PCN reaction causing severe rash involving mucus membranes or skin  necrosis: No Has patient had a PCN reaction that required hospitalization No Has patient had a PCN reaction occurring within the last 10 years: No If all of the above answers are "NO", then may proceed with Cephalosporin use.   Vancomycin Rash and Other (See Comments)    Vasculitis, Red Man's Syndrome   Codeine    Amitriptyline Other (See Comments)    Unknown   Antara [Fenofibrate] Other (See Comments)    Unknown   Bextra [Valdecoxib] Other (See Comments)    Unknown   Cefdinir Other (See Comments)    Unknown   Celebrex [Celecoxib] Other (See Comments)    Unknown   Crestor [Rosuvastatin Calcium] Other (See Comments)    Unknown   Escitalopram Oxalate Other (See Comments)    Unknown   Fish Oil Other (See Comments)    Unknown   Ibuprofen Swelling   Naproxen Sodium Other (See Comments)    Unknown   Neurontin [Gabapentin] Other (See Comments)    Unknown   Skelaxin Other (See Comments)    Unknown   Ultram [Tramadol] Other (See Comments)    Unknown   Vioxx [Rofecoxib] Other (See Comments)    Unknown   Zocor [Simvastatin] Other (See Comments)    Unknown   Outpatient Encounter Medications as of 09/10/2023  Medication Sig   aspirin 325 MG tablet Take 325 mg by mouth daily.  clotrimazole-betamethasone (LOTRISONE) cream Apply 1 application topically daily.   EPINEPHrine (EPI-PEN) 0.3 mg/0.3 mL SOAJ injection Inject 0.3 mLs (0.3 mg total) into the muscle once.   fluticasone (FLONASE) 50 MCG/ACT nasal spray Place 2 sprays into both nostrils daily.   levothyroxine (SYNTHROID) 50 MCG tablet TAKE 1 TABLET BY MOUTH EVERY DAY   lisinopril (ZESTRIL) 20 MG tablet Take 1 tablet (20 mg total) by mouth daily.   No facility-administered encounter medications on file as of 09/10/2023.    Past Surgical History:  Procedure Laterality Date   BACK SURGERY  1992   lumb fusion   ELBOW SURGERY  1965   right-fx   fix screws in sacrum  in office  1993   fusion lt sacrum with screws  1993   HERNIA  REPAIR     bilat ing 70 weeks old   I & D EXTREMITY Right 06/23/2013   Procedure: IRRIGATION AND DEBRIDEMENT EXTREMITY;  Surgeon: Dominica Severin, MD;  Location: [facility];  Service: Orthopedics;  Laterality: Right;   INGUINAL HERNIA REPAIR     bilat age 30   KNEE ARTHROSCOPY Left 05/04/2013   Procedure: ARTHROSCOPY KNEE WITH WASHOUT;  Surgeon: Sheral Apley, MD;  Location: [facility] OR;  Service: Orthopedics;  Laterality: Left;   ORIF CLAVICLE FRACTURE  1956   left-age 50   ORIF FIBULA FRACTURE  1956   lt-accident age 6yr-   ORIF PELVIC FRACTURE  1956   age 69   ORIF RADIUS & ULNA FRACTURES  1956   left-age 50   rt knee arthroscopic  07/2006   TEE WITHOUT CARDIOVERSION N/A 05/04/2013   Procedure: TRANSESOPHAGEAL ECHOCARDIOGRAM (TEE);  Surgeon: Donato Schultz, MD;  Location: [facility] ENDOSCOPY;  Service: Cardiovascular;  Laterality: N/A;   TENDON REPAIR Right 06/23/2013   Procedure: RIGHT INDEX FINGER AND PALM IRRIGATION AND DEBRIDEMENT FLEXOR  TENOSYNOVECTOMY BX AND REPAIR AS NECESSARY ;  Surgeon: Dominica Severin, MD;  Location: [facility];  Service: Orthopedics;  Laterality: Right;    Family History  Problem Relation Age of Onset   Stroke Father    Diabetes type II Brother       Controlled substance contract: n/a     Review of Systems  Constitutional:  Negative for diaphoresis.  Eyes:  Negative for pain.  Respiratory:  Negative for shortness of breath.   Cardiovascular:  Negative for chest pain, palpitations and leg swelling.  Gastrointestinal:  Negative for abdominal pain.  Endocrine: Negative for polydipsia.  Musculoskeletal:  Positive for back pain.  Skin:  Negative for rash.  Neurological:  Negative for dizziness, weakness and headaches.  Hematological:  Does not bruise/bleed easily.  All other systems reviewed and are negative.      Objective:   Physical Exam Vitals and nursing note reviewed.  Constitutional:      Appearance: Normal appearance. He  is well-developed.  HENT:     Head: Normocephalic.     Nose: Nose normal.     Mouth/Throat:     Mouth: Mucous membranes are moist.     Pharynx: Oropharynx is clear.  Eyes:     Pupils: Pupils are equal, round, and reactive to light.  Neck:     Thyroid: No thyroid mass or thyromegaly.     Vascular: No carotid bruit or JVD.     Trachea: Phonation normal.  Cardiovascular:     Rate and Rhythm: Normal rate and regular rhythm.  Pulmonary:     Effort: Pulmonary effort is normal. No  respiratory distress.     Breath sounds: Normal breath sounds.  Abdominal:     General: Bowel sounds are normal.     Palpations: Abdomen is soft.     Tenderness: There is no abdominal tenderness.  Musculoskeletal:        General: Normal range of motion.     Cervical back: Normal range of motion and neck supple.  Lymphadenopathy:     Cervical: No cervical adenopathy.  Skin:    General: Skin is warm and dry.  Neurological:     Mental Status: He is alert and oriented to person, place, and time.  Psychiatric:        Behavior: Behavior normal.        Thought Content: Thought content normal.        Judgment: Judgment normal.      BP (!) 144/90   Pulse 97   Temp (!) 96.9 F (36.1 C) (Temporal)   Ht 5\' 10"  (1.778 m)   Wt (!) 315 lb (142.9 kg)   SpO2 91%   BMI 45.20 kg/m    Hgba1c 5.6%      Assessment & Plan:   Seth Martinez comes in today with chief complaint of No chief complaint on file.   Diagnosis and orders addressed:  1. Primary hypertension Changed blood pressure meds to lisinopril 40 - CBC with Differential/Platelet - CMP14+EGFR - lisinopril (ZESTRIL) 40 MG tablet; Take 1 tablet (40 mg total) by mouth daily.  Dispense: 90 tablet; Refill: 3  2. Mixed hyperlipidemia Low fat diet - Lipid panel  3. Metabolic syndrome Continue to watch carbs in diet - Bayer DCA Hb A1c Waived - Microalbumin / creatinine urine ratio  4. Acquired hypothyroidism Labs pending - levothyroxine  (SYNTHROID) 50 MCG tablet; Take 1 tablet (50 mcg total) by mouth daily.  Dispense: 90 tablet; Refill: 1  5. Statin intolerance Strict low fat diet  6. Vitamin D deficiency Continue daily vitamin d supplement  7. Benign prostatic hyperplasia with nocturia Report any voiding issues  8. Morbid obesity (HCC) Discussed diet and exercise for person with BMI >25 Will recheck weight in 3-6 months   9. Acute midline low back pain without sciatica Exercise daily Daily stretches Waiting on radiology report - DG Lumbar Spine 2-3 Views  oxyCODONE-acetaminophen (PERCOCET/ROXICET) 5-325 MG tablet    Sig: Take 1 tablet by mouth every 4 (four) hours as needed for up to 5 days for severe pain (pain score 7-10).    Dispense:  30 tablet    Refill:  0    Supervising Provider:   Arville Care A [id number]     Labs pending Health Maintenance reviewed Diet and exercise encouraged  Follow up plan: 6 months   Mary-Margaret Daphine Deutscher, FNP

## 2023-09-10 NOTE — Patient Instructions (Signed)
Shoulder Pain Many things can cause shoulder pain, including: An injury to the shoulder. Overuse of the shoulder. Arthritis. The source of the pain can be: Inflammation. An injury to the shoulder joint. An injury to a tendon, ligament, or bone. Follow these instructions at home: Pay attention to changes in your symptoms. Let your health care provider know about them. Follow these instructions to relieve your pain. If you have a removable sling: Wear the sling as told by your provider. Remove it only as told by your provider. Check the skin around the sling every day. Tell your provider about any concerns. Loosen the sling if your fingers tingle, become numb, or become cold. Keep the sling clean. If the sling is not waterproof: Do not let it get wet. Remove it to shower or bathe. Move your arm as little as possible, but keep your hand moving to prevent swelling. Managing pain, stiffness, and swelling  If told, put ice on the painful area. If you have a removable sling or immobilizer, remove it as told by your provider. Put ice in a plastic bag. Place a towel between your skin and the bag. Leave the ice on for 20 minutes, 2-3 times a day. If your skin turns bright red, remove the ice right away to prevent skin damage. The risk of damage is higher if you cannot feel pain, heat, or cold. Move your fingers often to reduce stiffness and swelling. Squeeze a soft ball or a foam pad as much as possible. This helps to keep the shoulder from swelling. It also helps to strengthen the arm. General instructions Take over-the-counter and prescription medicines only as told by your provider. Exercise may help with pain management. Perform exercises if told by your provider. You may be referred to a physical therapist to help in your recovery process. Keep all follow-up visits in order to avoid any type of permanent shoulder disability or chronic pain problems. Contact a health care provider  if: Your pain is not relieved with medicines. New pain develops in your arm, hand, or fingers. You loosen your sling and your arm, hand, or fingers remain tingly, numb, swollen, or painful. Get help right away if: Your arm, hand, or fingers turn white or blue. This information is not intended to replace advice given to you by your health care provider. Make sure you discuss any questions you have with your health care provider. Document Revised: 02/06/2022 Document Reviewed: 02/06/2022 Elsevier Patient Education  2024 Elsevier Inc.  

## 2023-09-11 LAB — CMP14+EGFR
ALT: 33 [IU]/L (ref 0–44)
AST: 30 [IU]/L (ref 0–40)
Albumin: 4.4 g/dL (ref 3.8–4.8)
Alkaline Phosphatase: 63 [IU]/L (ref 44–121)
BUN/Creatinine Ratio: 10 (ref 10–24)
BUN: 11 mg/dL (ref 8–27)
Bilirubin Total: 0.8 mg/dL (ref 0.0–1.2)
CO2: 20 mmol/L (ref 20–29)
Calcium: 9.3 mg/dL (ref 8.6–10.2)
Chloride: 103 mmol/L (ref 96–106)
Creatinine, Ser: 1.07 mg/dL (ref 0.76–1.27)
Globulin, Total: 2.3 g/dL (ref 1.5–4.5)
Glucose: 118 mg/dL — ABNORMAL HIGH (ref 70–99)
Potassium: 4.2 mmol/L (ref 3.5–5.2)
Sodium: 141 mmol/L (ref 134–144)
Total Protein: 6.7 g/dL (ref 6.0–8.5)
eGFR: 73 mL/min/{1.73_m2} (ref >59)

## 2023-09-11 LAB — CBC WITH DIFFERENTIAL/PLATELET
Basophils Absolute: 0.1 10*3/uL (ref 0.0–0.2)
Basos: 1 %
EOS (ABSOLUTE): 0.4 10*3/uL (ref 0.0–0.4)
Eos: 5 %
Hematocrit: 48.3 % (ref 37.5–51.0)
Hemoglobin: 15.8 g/dL (ref 13.0–17.7)
Immature Grans (Abs): 0 10*3/uL (ref 0.0–0.1)
Immature Granulocytes: 0 %
Lymphocytes Absolute: 2.3 10*3/uL (ref 0.7–3.1)
Lymphs: 31 %
MCH: 29.9 pg (ref 26.6–33.0)
MCHC: 32.7 g/dL (ref 31.5–35.7)
MCV: 91 fL (ref 79–97)
Monocytes Absolute: 0.7 10*3/uL (ref 0.1–0.9)
Monocytes: 9 %
Neutrophils Absolute: 3.9 10*3/uL (ref 1.4–7.0)
Neutrophils: 54 %
Platelets: 265 10*3/uL (ref 150–450)
RBC: 5.29 x10E6/uL (ref 4.14–5.80)
RDW: 13.5 % (ref 11.6–15.4)
WBC: 7.3 10*3/uL (ref 3.4–10.8)

## 2023-09-11 LAB — LIPID PANEL
Chol/HDL Ratio: 4.3 {ratio} (ref 0.0–5.0)
Cholesterol, Total: 173 mg/dL (ref 100–199)
HDL: 40 mg/dL (ref >39)
LDL Chol Calc (NIH): 94 mg/dL (ref 0–99)
Triglycerides: 228 mg/dL — ABNORMAL HIGH (ref 0–149)
VLDL Cholesterol Cal: 39 mg/dL (ref 5–40)

## 2023-09-13 DIAGNOSIS — M25641 Stiffness of right hand, not elsewhere classified: Secondary | ICD-10-CM | POA: Diagnosis not present

## 2023-09-22 ENCOUNTER — Telehealth: Payer: Self-pay | Admitting: Nurse Practitioner

## 2023-09-22 ENCOUNTER — Telehealth: Payer: Self-pay

## 2023-09-22 NOTE — Telephone Encounter (Signed)
 Copied from CRM [id number]. Topic: Clinical - Medication Question >> Sep 22, 2023 11:43 AM Gery Pray wrote: Reason for CRM: Wife called and stated that patient is having terrible back pains. States that it is out of position. Wife would like to know if Dr. Camelia Eng will write patient a prescription for Prednisone and send it to the pharmacy [id number] - [place], [place] - [address] [place] [address] Phone: [phone] Fax: [phone]  Please contact wife at [phone]

## 2023-09-23 ENCOUNTER — Telehealth: Payer: Self-pay | Admitting: Family Medicine

## 2023-09-23 MED ORDER — PREDNISONE 20 MG PO TABS: 40.0000 mg | ORAL_TABLET | Freq: Every day | ORAL | 0 refills | Status: AC

## 2023-09-23 NOTE — Telephone Encounter (Signed)
 Copied from CRM [id number]. Topic: Clinical - Medication Question >> Sep 22, 2023  5:00 PM Santiya F wrote: Reason for CRM: Patient's wife is calling in because she wants to know if another provider can send in Prednisone for patient since his pcp wasn't in the office today.

## 2023-09-23 NOTE — Telephone Encounter (Signed)
 Was seen for follow up on 09/10/23 and was having back pain then. Has worsened. Would ike to try some prednisone.  Meds ordered this encounter  Medications   predniSONE (DELTASONE) 20 MG tablet    Sig: Take 2 tablets (40 mg total) by mouth daily with breakfast for 5 days. 2 po daily for 5 days    Dispense:  10 tablet    Refill:  0    Supervising Provider:   Arville Care A [[id number]]   Mary-Margaret Daphine Deutscher, FNP

## 2023-09-23 NOTE — Addendum Note (Signed)
 Addended by: Bennie Pierini on: 09/23/2023 02:51 PM   Modules accepted: Orders

## 2023-09-23 NOTE — Telephone Encounter (Signed)
 Left detailed message on patients voicemail that rx was sent to pharmacy

## 2023-09-27 DIAGNOSIS — M25621 Stiffness of right elbow, not elsewhere classified: Secondary | ICD-10-CM | POA: Diagnosis not present

## 2023-09-27 DIAGNOSIS — M25641 Stiffness of right hand, not elsewhere classified: Secondary | ICD-10-CM | POA: Diagnosis not present

## 2024-02-24 ENCOUNTER — Encounter: Payer: Self-pay | Admitting: Nurse Practitioner

## 2024-02-24 ENCOUNTER — Ambulatory Visit: Payer: Medicare Other | Admitting: Nurse Practitioner

## 2024-02-24 VITALS — BP 139/81 | HR 80 | Temp 97.3°F | Ht 70.0 in | Wt 309.0 lb

## 2024-02-24 DIAGNOSIS — G8929 Other chronic pain: Secondary | ICD-10-CM

## 2024-02-24 DIAGNOSIS — Z0000 Encounter for general adult medical examination without abnormal findings: Secondary | ICD-10-CM

## 2024-02-24 DIAGNOSIS — R351 Nocturia: Secondary | ICD-10-CM

## 2024-02-24 DIAGNOSIS — N401 Enlarged prostate with lower urinary tract symptoms: Secondary | ICD-10-CM

## 2024-02-24 DIAGNOSIS — E039 Hypothyroidism, unspecified: Secondary | ICD-10-CM

## 2024-02-24 DIAGNOSIS — Z0001 Encounter for general adult medical examination with abnormal findings: Secondary | ICD-10-CM

## 2024-02-24 DIAGNOSIS — E559 Vitamin D deficiency, unspecified: Secondary | ICD-10-CM

## 2024-02-24 DIAGNOSIS — E8881 Metabolic syndrome: Secondary | ICD-10-CM

## 2024-02-24 DIAGNOSIS — Z125 Encounter for screening for malignant neoplasm of prostate: Secondary | ICD-10-CM | POA: Diagnosis not present

## 2024-02-24 DIAGNOSIS — M545 Low back pain, unspecified: Secondary | ICD-10-CM

## 2024-02-24 DIAGNOSIS — E782 Mixed hyperlipidemia: Secondary | ICD-10-CM | POA: Diagnosis not present

## 2024-02-24 LAB — BAYER DCA HB A1C WAIVED: HB A1C (BAYER DCA - WAIVED): 5.7 % — ABNORMAL HIGH (ref 4.8–5.6)

## 2024-02-24 LAB — LIPID PANEL

## 2024-02-24 MED ORDER — LEVOTHYROXINE SODIUM 50 MCG PO TABS: 50.0000 ug | ORAL_TABLET | Freq: Every day | ORAL | 1 refills | Status: DC

## 2024-02-24 MED ORDER — OXYCODONE-ACETAMINOPHEN 5-325 MG PO TABS: 1.0000 | ORAL_TABLET | ORAL | 0 refills | Status: AC | PRN

## 2024-02-24 MED ORDER — HYDROCODONE-ACETAMINOPHEN 5-325 MG PO TABS: 1.0000 | ORAL_TABLET | Freq: Four times a day (QID) | ORAL | 0 refills | Status: DC | PRN

## 2024-02-24 MED ORDER — LISINOPRIL 40 MG PO TABS: 40.0000 mg | ORAL_TABLET | Freq: Every day | ORAL | 1 refills | Status: DC

## 2024-02-24 NOTE — Patient Instructions (Signed)

## 2024-02-24 NOTE — Progress Notes (Signed)
 Subjective:    Patient ID: Seth Martinez, male    DOB: 02-01-1951, 73 y.o.   MRN: [id number]   Chief Complaint: annual physical   HPI:  Seth Martinez is a 73 y.o. who identifies as a male who was assigned male at birth.   Social history: Lives with: wife Work history: retired   Water engineer in today for follow up of the following chronic medical issues:  1. Primary hypertension No c/o chest pain, sob or headache. Does check blood pressure at home. At home running around 140's systolic BP Readings from Last 3 Encounters:  09/10/23 (!) 144/90  05/28/23 (!) 165/108  04/27/23 (!) 151/92     2. Mixed hyperlipidemia Does not watch diet and does no exercise. Statin intolerance-also refuses repatha Lab Results  Component Value Date   CHOL 173 09/10/2023   HDL 40 09/10/2023   LDLCALC 94 09/10/2023   TRIG 228 (H) 09/10/2023   CHOLHDL 4.3 09/10/2023   The 10-year ASCVD risk score (Arnett DK, et al., 2019) is: 53.4%   3. Metabolic syndrome Does not check blood sugar at home Lab Results  Component Value Date   HGBA1C 5.6 09/10/2023     4. Acquired hypothyroidism No issues that he is aware of. Lab Results  Component Value Date   TSH 3.280 03/30/2022     5. Statin intolerance  6. Vitamin D  deficiency Is on daily vitamin d  supplement  7. Benign prostatic hyperplasia with nocturia No voiding issues  8. Morbid obesity (HCC) Weight is down 6lbs  Wt Readings from Last 3 Encounters:  02/24/24 (!) 309 lb (140.2 kg)  09/10/23 (!) 315 lb (142.9 kg)  05/28/23 (!) 315 lb (142.9 kg)   BMI Readings from Last 3 Encounters:  02/24/24 44.34 kg/m  09/10/23 45.20 kg/m  05/28/23 45.20 kg/m    9. CHronic low back pain Does well most days. Occasionally needs pain pill.   New complaints: None today  Allergies  Allergen Reactions   Morphine And Codeine Anaphylaxis   Penicillins Anaphylaxis    Has patient had a PCN reaction causing immediate rash,  facial/tongue/throat swelling, SOB or lightheadedness with hypotension: Yes Has patient had a PCN reaction causing severe rash involving mucus membranes or skin necrosis: No Has patient had a PCN reaction that required hospitalization No Has patient had a PCN reaction occurring within the last 10 years: No If all of the above answers are NO, then may proceed with Cephalosporin use.   Vancomycin  Rash and Other (See Comments)    Vasculitis, Red Man's Syndrome   Codeine    Amitriptyline Other (See Comments)    Unknown   Antara [Fenofibrate] Other (See Comments)    Unknown   Bextra [Valdecoxib] Other (See Comments)    Unknown   Cefdinir Other (See Comments)    Unknown   Celebrex [Celecoxib] Other (See Comments)    Unknown   Crestor [Rosuvastatin Calcium] Other (See Comments)    Unknown   Escitalopram Oxalate Other (See Comments)    Unknown   Fish Oil Other (See Comments)    Unknown   Ibuprofen Swelling   Naproxen Sodium Other (See Comments)    Unknown   Neurontin [Gabapentin] Other (See Comments)    Unknown   Skelaxin Other (See Comments)    Unknown   Ultram [Tramadol] Other (See Comments)    Unknown   Vioxx [Rofecoxib] Other (See Comments)    Unknown   Zocor [Simvastatin] Other (See Comments)    Unknown  Outpatient Encounter Medications as of 02/24/2024  Medication Sig   Ascorbic Acid (VITA-C PO) Take by mouth.   aspirin 325 MG tablet Take 325 mg by mouth daily.   clotrimazole -betamethasone  (LOTRISONE ) cream Apply 1 application topically daily.   EPINEPHrine  (EPI-PEN) 0.3 mg/0.3 mL SOAJ injection Inject 0.3 mLs (0.3 mg total) into the muscle once.   fluticasone  (FLONASE ) 50 MCG/ACT nasal spray Place 2 sprays into both nostrils daily.   levothyroxine  (SYNTHROID ) 50 MCG tablet Take 1 tablet (50 mcg total) by mouth daily.   lisinopril  (ZESTRIL ) 40 MG tablet Take 1 tablet (40 mg total) by mouth daily.   Pyridoxine HCl (VITAMIN B6 PO) Take by mouth.   No  facility-administered encounter medications on file as of 02/24/2024.    Past Surgical History:  Procedure Laterality Date   BACK SURGERY  1992   lumb fusion   ELBOW SURGERY  1965   right-fx   fix screws in sacrum  in office  1993   fusion lt sacrum with screws  1993   HERNIA REPAIR     bilat ing 41 weeks old   I & D EXTREMITY Right 06/23/2013   Procedure: IRRIGATION AND DEBRIDEMENT EXTREMITY;  Surgeon: Elsie Mussel, MD;  Location: [facility];  Service: Orthopedics;  Laterality: Right;   INGUINAL HERNIA REPAIR     bilat age 83   KNEE ARTHROSCOPY Left 05/04/2013   Procedure: ARTHROSCOPY KNEE WITH WASHOUT;  Surgeon: Evalene JONETTA Chancy, MD;  Location: [facility] OR;  Service: Orthopedics;  Laterality: Left;   ORIF CLAVICLE FRACTURE  1956   left-age 89   ORIF FIBULA FRACTURE  1956   lt-accident age 89yr-   ORIF PELVIC FRACTURE  1956   age 55   ORIF RADIUS & ULNA FRACTURES  1956   left-age 89   rt knee arthroscopic  07/2006   TEE WITHOUT CARDIOVERSION N/A 05/04/2013   Procedure: TRANSESOPHAGEAL ECHOCARDIOGRAM (TEE);  Surgeon: Oneil Parchment, MD;  Location[facility] END[facility]COPY;  Service: Cardiovascular;  Laterality: N/A;   TENDON REPAIR Right 06/23/2013   Procedure: RIGHT INDEX FINGER AND PALM IRRIGATION AND DEBRIDEMENT FLEXOR  TENOSYNOVECTOMY BX AND REPAIR AS NECESSARY ;  Surgeon: Elsie Mussel, MD;  Location: [facility];  Service: Orthopedics;  Laterality: Right;    Family History  Problem Relation Age of Onset   Stroke Father    Diabetes type II Brother       Controlled substance contract: n/a     Review of Systems  Constitutional:  Negative for diaphoresis.  Eyes:  Negative for pain.  Respiratory:  Negative for shortness of breath.   Cardiovascular:  Negative for chest pain, palpitations and leg swelling.  Gastrointestinal:  Negative for abdominal pain.  Endocrine: Negative for polydipsia.  Musculoskeletal:  Positive for back pain.  Skin:  Negative for rash.   Neurological:  Negative for dizziness, weakness and headaches.  Hematological:  Does not bruise/bleed easily.  All other systems reviewed and are negative.      Objective:   Physical Exam Vitals and nursing note reviewed.  Constitutional:      Appearance: Normal appearance. He is well-developed.  HENT:     Head: Normocephalic.     Nose: Nose normal.     Mouth/Throat:     Mouth: Mucous membranes are moist.     Pharynx: Oropharynx is clear.  Eyes:     Pupils: Pupils are equal, round, and reactive to light.  Neck:     Thyroid : No thyroid  mass or  thyromegaly.     Vascular: No carotid bruit or JVD.     Trachea: Phonation normal.  Cardiovascular:     Rate and Rhythm: Normal rate and regular rhythm.  Pulmonary:     Effort: Pulmonary effort is normal. No respiratory distress.     Breath sounds: Normal breath sounds.  Abdominal:     General: Bowel sounds are normal.     Palpations: Abdomen is soft.     Tenderness: There is no abdominal tenderness.  Musculoskeletal:        General: Normal range of motion.     Cervical back: Normal range of motion and neck supple.  Lymphadenopathy:     Cervical: No cervical adenopathy.  Skin:    General: Skin is warm and dry.  Neurological:     Mental Status: He is alert and oriented to person, place, and time.  Psychiatric:        Behavior: Behavior normal.        Thought Content: Thought content normal.        Judgment: Judgment normal.      BP 139/81   Pulse 80   Temp (!) 97.3 F (36.3 C) (Temporal)   Ht 5' 10 (1.778 m)   Wt (!) 309 lb (140.2 kg)   SpO2 95%   BMI 44.34 kg/m     Hgba1c 5.7%      Assessment & Plan:   DENIZ HANNAN comes in today with chief complaint of Medical Management of Chronic Issues   Diagnosis and orders addressed:  1. Annual physical exam (Primary)   2. Mixed hyperlipidemia Low fat diet - CBC with Differential/Platelet - CMP14+EGFR - Lipid panel - lisinopril  (ZESTRIL ) 40 MG tablet;  Take 1 tablet (40 mg total) by mouth daily.  Dispense: 90 tablet; Refill: 1  3. Acquired hypothyroidism Labs pending - Thyroid  Panel With TSH - levothyroxine  (SYNTHROID ) 50 MCG tablet; Take 1 tablet (50 mcg total) by mouth daily.  Dispense: 90 tablet; Refill: 1  4. Metabolic syndrome  - Bayer DCA Hb A1c Waived - Microalbumin / creatinine urine ratio  5. Vitamin D  deficiency Labs pending  6. Morbid obesity (HCC) Discussed diet and exercise for person with BMI >25 Will recheck weight in 3-6 months   7. Benign prostatic hyperplasia with nocturia Report any voiding issues  8. Screening for prostate cancer Labs pending - PSA, total and free  9. Chronic bilateral low back pain without sciatica Limit use of pain meds - HYDROcodone -acetaminophen  (NORCO/VICODIN) 5-325 MG tablet; Take 1 tablet by mouth every 6 (six) hours as needed for moderate pain (pain score 4-6).  Dispense: 30 tablet; Refill: 0   Labs pending Health Maintenance reviewed Diet and exercise encouraged  Follow up plan: 6 months   Mary-Margaret Gladis, FNP

## 2024-02-24 NOTE — Addendum Note (Signed)
 Addended by: Nga Rabon, MARY-MARGARET on: 02/24/2024 11:21 AM   Modules accepted: Orders

## 2024-02-25 ENCOUNTER — Ambulatory Visit: Payer: Self-pay | Admitting: Nurse Practitioner

## 2024-02-25 DIAGNOSIS — E8881 Metabolic syndrome: Secondary | ICD-10-CM | POA: Diagnosis not present

## 2024-02-25 LAB — CMP14+EGFR
ALT: 33 IU/L (ref 0–44)
AST: 39 IU/L (ref 0–40)
Albumin: 4.4 g/dL (ref 3.8–4.8)
Alkaline Phosphatase: 61 IU/L (ref 44–121)
BUN/Creatinine Ratio: 11 (ref 10–24)
BUN: 15 mg/dL (ref 8–27)
Bilirubin Total: 0.6 mg/dL (ref 0.0–1.2)
CO2: 21 mmol/L (ref 20–29)
Calcium: 9.6 mg/dL (ref 8.6–10.2)
Chloride: 104 mmol/L (ref 96–106)
Creatinine, Ser: 1.32 mg/dL — AB (ref 0.76–1.27)
Globulin, Total: 2.3 g/dL (ref 1.5–4.5)
Glucose: 107 mg/dL — AB (ref 70–99)
Potassium: 4.4 mmol/L (ref 3.5–5.2)
Sodium: 141 mmol/L (ref 134–144)
Total Protein: 6.7 g/dL (ref 6.0–8.5)
eGFR: 57 mL/min/1.73 — AB (ref >59)

## 2024-02-25 LAB — CBC WITH DIFFERENTIAL/PLATELET
Basophils Absolute: 0.1 x10E3/uL (ref 0.0–0.2)
Basos: 1 %
EOS (ABSOLUTE): 0.3 x10E3/uL (ref 0.0–0.4)
Eos: 5 %
Hematocrit: 48.2 % (ref 37.5–51.0)
Hemoglobin: 15.7 g/dL (ref 13.0–17.7)
Immature Grans (Abs): 0 x10E3/uL (ref 0.0–0.1)
Immature Granulocytes: 0 %
Lymphocytes Absolute: 1.7 x10E3/uL (ref 0.7–3.1)
Lymphs: 28 %
MCH: 30.4 pg (ref 26.6–33.0)
MCHC: 32.6 g/dL (ref 31.5–35.7)
MCV: 93 fL (ref 79–97)
Monocytes Absolute: 0.6 x10E3/uL (ref 0.1–0.9)
Monocytes: 10 %
Neutrophils Absolute: 3.5 x10E3/uL (ref 1.4–7.0)
Neutrophils: 56 %
Platelets: 257 x10E3/uL (ref 150–450)
RBC: 5.17 x10E6/uL (ref 4.14–5.80)
RDW: 13.2 % (ref 11.6–15.4)
WBC: 6.2 x10E3/uL (ref 3.4–10.8)

## 2024-02-25 LAB — PSA, TOTAL AND FREE
PSA, Free Pct: 47.5
PSA, Free: 0.38 ng/mL
Prostate Specific Ag, Serum: 0.8 ng/mL (ref 0.0–4.0)

## 2024-02-25 LAB — LIPID PANEL
Cholesterol, Total: 151 mg/dL (ref 100–199)
HDL: 35 mg/dL — AB (ref >39)
LDL CALC COMMENT:: 4.3 ratio (ref 0.0–5.0)
LDL Chol Calc (NIH): 88 mg/dL (ref 0–99)
Triglycerides: 159 mg/dL — AB (ref 0–149)
VLDL Cholesterol Cal: 28 mg/dL (ref 5–40)

## 2024-02-25 LAB — THYROID PANEL WITH TSH
Free Thyroxine Index: 1.5 (ref 1.2–4.9)
T3 Uptake Ratio: 26 (ref 24–39)
T4, Total: 5.7 ug/dL (ref 4.5–12.0)
TSH: 2.59 u[IU]/mL (ref 0.450–4.500)

## 2024-02-27 LAB — MICROALBUMIN / CREATININE URINE RATIO
Creatinine, Urine: 115.7 mg/dL
Microalb/Creat Ratio: 8 mg/g{creat} (ref 0–29)
Microalbumin, Urine: 8.9 ug/mL

## 2024-04-13 ENCOUNTER — Ambulatory Visit

## 2024-04-13 VITALS — BP 139/81 | HR 80 | Ht 70.0 in | Wt 309.0 lb

## 2024-04-13 DIAGNOSIS — Z0000 Encounter for general adult medical examination without abnormal findings: Secondary | ICD-10-CM

## 2024-04-13 NOTE — Progress Notes (Signed)
 Subjective:   Seth Martinez is a 73 y.o. who presents for a Medicare Wellness preventive visit.  As a reminder, Annual Wellness Visits don't include a physical exam, and some assessments may be limited, especially if this visit is performed virtually. We may recommend an in-person follow-up visit with your provider if needed.  Visit Complete: Virtual I connected with  Seth Martinez on 04/13/24 by a audio enabled telemedicine application and verified that I am speaking with the correct person using two identifiers.  Patient Location: Home  Provider Location: Home Office  I discussed the limitations of evaluation and management by telemedicine. The patient expressed understanding and agreed to proceed.  Vital Signs: Because this visit was a virtual/telehealth visit, some criteria may be missing or patient reported. Any vitals not documented were not able to be obtained and vitals that have been documented are patient reported.  VideoDeclined- This patient declined Librarian, academic. Therefore the visit was completed with audio only.  Persons Participating in Visit: Patient.  AWV Questionnaire: No: Patient Medicare AWV questionnaire was not completed prior to this visit.  Cardiac Risk Factors include: advanced age (>45men, >10 women);dyslipidemia;hypertension;obesity (BMI >30kg/m2);male gender     Objective:    Today's Vitals   04/13/24 1237  BP: 139/81  Pulse: 80  Weight: (!) 309 lb (140.2 kg)  Height: 5' 10 (1.778 m)   Body mass index is 44.34 kg/m.     04/13/2024   12:42 PM 12/25/2022    3:34 PM 12/22/2021   10:09 AM 10/15/2020    3:04 PM 10/13/2019    2:31 PM 04/27/2018   11:01 AM 06/22/2013   10:30 AM  Advanced Directives  Does Patient Have a Medical Advance Directive? No No No No No No  Patient does not have advance directive;Patient would not like information   Would patient like information on creating a medical advance directive?   No - Patient declined No - Patient declined No - Patient declined No - Patient declined Yes (MAU/Ambulatory/Procedural Areas - Information given)       Data saved with a previous flowsheet row definition    Current Medications (verified) Outpatient Encounter Medications as of 04/13/2024  Medication Sig   Ascorbic Acid (VITA-C PO) Take by mouth.   aspirin 325 MG tablet Take 325 mg by mouth daily.   clotrimazole -betamethasone  (LOTRISONE ) cream Apply 1 application topically daily.   EPINEPHrine  (EPI-PEN) 0.3 mg/0.3 mL SOAJ injection Inject 0.3 mLs (0.3 mg total) into the muscle once.   fluticasone  (FLONASE ) 50 MCG/ACT nasal spray Place 2 sprays into both nostrils daily.   levothyroxine  (SYNTHROID ) 50 MCG tablet Take 1 tablet (50 mcg total) by mouth daily.   lisinopril  (ZESTRIL ) 40 MG tablet Take 1 tablet (40 mg total) by mouth daily.   Pyridoxine HCl (VITAMIN B6 PO) Take by mouth.   No facility-administered encounter medications on file as of 04/13/2024.    Allergies (verified) Morphine and codeine, Penicillins, Vancomycin , Codeine, Amitriptyline, Antara [fenofibrate], Bextra [valdecoxib], Cefdinir, Celebrex [celecoxib], Crestor [rosuvastatin calcium], Escitalopram oxalate, Fish oil, Ibuprofen, Naproxen sodium, Neurontin [gabapentin], Skelaxin, Ultram [tramadol], Vioxx [rofecoxib], and Zocor [simvastatin]   History: Past Medical History:  Diagnosis Date   Chronic back pain    Depression    Fatigue    Hyperlipidemia    Hypertension    Insomnia    Joint pain    Meniscus tear    bilateral knees   Metabolic syndrome    Obesity    Septic arthritis (  HCC) October/2014   Vertigo    Past Surgical History:  Procedure Laterality Date   BACK SURGERY  1992   lumb fusion   ELBOW SURGERY  1965   right-fx   fix screws in sacrum  in office  1993   fusion lt sacrum with screws  1993   HERNIA REPAIR     bilat ing 57 weeks old   I & D EXTREMITY Right 06/23/2013   Procedure: IRRIGATION AND  DEBRIDEMENT EXTREMITY;  Surgeon: Seth Mussel, MD;  Location: [facility];  Service: Orthopedics;  Laterality: Right;   INGUINAL HERNIA REPAIR     bilat age 57   KNEE ARTHROSCOPY Left 05/04/2013   Procedure: ARTHROSCOPY KNEE WITH WASHOUT;  Surgeon: Evalene JONETTA Chancy, MD;  Location: [facility] OR;  Service: Orthopedics;  Laterality: Left;   ORIF CLAVICLE FRACTURE  1956   left-age 51   ORIF FIBULA FRACTURE  1956   lt-accident age 18yr-   ORIF PELVIC FRACTURE  1956   age 15   ORIF RADIUS & ULNA FRACTURES  1956   left-age 51   rt knee arthroscopic  07/2006   TEE WITHOUT CARDIOVERSION N/A 05/04/2013   Procedure: TRANSESOPHAGEAL ECHOCARDIOGRAM (TEE);  Surgeon: Oneil Parchment, MD;  Location: [facility] ENDOSCOPY;  Service: Cardiovascular;  Laterality: N/A;   TENDON REPAIR Right 06/23/2013   Procedure: RIGHT INDEX FINGER AND PALM IRRIGATION AND DEBRIDEMENT FLEXOR  TENOSYNOVECTOMY BX AND REPAIR AS NECESSARY ;  Surgeon: Seth Mussel, MD;  Location:  SUR[facility]ERY CENTER;  Service: Orthopedics;  Laterality: Right;   Family History  Problem Relation Age of Onset   Stroke Father    Diabetes type II Brother    Social History   Socioeconomic History   Marital status: Married    Spouse name: Lonell   Number of children: 2   Years of education: 12   Highest education level: 12th grade  Occupational History   Occupation: retired  Tobacco Use   Smoking status: Never   Smokeless tobacco: Never  Vaping Use   Vaping status: Never Used  Substance and Sexual Activity   Alcohol use: No    Comment: rare   Drug use: No   Sexual activity: Not on file  Other Topics Concern   Not on file  Social History Narrative   ** Merged History Encounter **       Kenna is retired and lives at home with his wife Lonell. His 65 yo mother is currently residing with them. He has two grown children. He enjoys watching TV.   Social Drivers of Corporate investment banker Strain: Low Risk  (04/13/2024)   Overall  Financial Resource Strain (CARDIA)    Difficulty of Paying Living Expenses: Not hard at all  Food Insecurity: No Food Insecurity (04/13/2024)   Hunger Vital Sign    Worried About Running Out of Food in the Last Year: Never true    Ran Out of Food in the Last Year: Never true  Transportation Needs: No Transportation Needs (04/13/2024)   PRAPARE - Administrator, Civil Service (Medical): No    Lack of Transportation (Non-Medical): No  Physical Activity: Inactive (04/13/2024)   Exercise Vital Sign    Days of Exercise per Week: 0 days    Minutes of Exercise per Session: 0 min  Stress: No Stress Concern Present (04/13/2024)   [employer] of Occupational Health - Occupational Stress Questionnaire    Feeling of Stress: Not at all  Social Connections:  Moderately Integrated (04/13/2024)   Social Connection and Isolation Panel    Frequency of Communication with Friends and Family: More than three times a week    Frequency of Social Gatherings with Friends and Family: More than three times a week    Attends Religious Services: More than 4 times per year    Active Member of [employer] or Organizations: No    Attends Engineer, structural: Never    Marital Status: Married    Tobacco Counseling Counseling given: Yes    Clinical Intake:  Pre-visit preparation completed: Yes  Pain : No/denies pain     BMI - recorded: 44.34 Nutritional Status: BMI > 30  Obese Nutritional Risks: None Diabetes: Yes CBG done?: No  Lab Results  Component Value Date   HGBA1C 5.7 (H) 02/24/2024   HGBA1C 5.6 09/10/2023   HGBA1C 5.7 (H) 03/15/2023     How often do you need to have someone help you when you read instructions, pamphlets, or other written materials from your doctor or pharmacy?: 1 - Never  Interpreter Needed?: No  Information entered by :: alia t/cma   Activities of Daily Living     04/13/2024   12:40 PM  In your present state of health, do you have any difficulty  performing the following activities:  Hearing? 1  Vision? 0  Difficulty concentrating or making decisions? 1  Walking or climbing stairs? 1  Dressing or bathing? 0  Doing errands, shopping? 0  Preparing Food and eating ? N  Using the Toilet? N  In the past six months, have you accidently leaked urine? Y  Do you have problems with loss of bowel control? Y  Managing your Medications? N  Managing your Finances? N  Housekeeping or managing your Housekeeping? N    Patient Care Team: Gladis Mustard, FNP as PCP - General (Family Medicine) Colon Shove, MD as Consulting Physician (Neurosurgery)  I have updated your Care Teams any recent Medical Services you may have received from other providers in the past year.     Assessment:   This is a routine wellness examination for Seth Martinez.  Hearing/Vision screen Hearing Screening - Comments:: Pt have some hearing dif Vision Screening - Comments:: Pt wear glasses/last ov 2025   Goals Addressed             This Visit's Progress    Patient Stated   On track    10/15/2020 AWV Goal: Exercise for General Health  Patient will verbalize understanding of the benefits of increased physical activity: Exercising regularly is important. It will improve your overall fitness, flexibility, and endurance. Regular exercise also will improve your overall health. It can help you control your weight, reduce stress, and improve your bone density. Over the next year, patient will increase physical activity as tolerated with a goal of at least 150 minutes of moderate physical activity per week.  You can tell that you are exercising at a moderate intensity if your heart starts beating faster and you start breathing faster but can still hold a conversation. Moderate-intensity exercise ideas include: Walking 1 mile (1.6 km) in about 15 minutes Biking Hiking Golfing Dancing Water  aerobics Patient will verbalize understanding of everyday activities  that increase physical activity by providing examples like the following: Yard work, such as: Insurance underwriter Gardening Washing windows or floors Patient will be able to explain general safety guidelines for exercising:  Before you  start a new exercise program, talk with your health care provider. Do not exercise so much that you hurt yourself, feel dizzy, or get very short of breath. Wear comfortable clothes and wear shoes with good support. Drink plenty of water  while you exercise to prevent dehydration or heat stroke. Work out until your breathing and your heartbeat get faster.        Depression Screen     04/13/2024   12:43 PM 02/24/2024   10:54 AM 04/23/2023    9:05 AM 03/15/2023   10:15 AM 12/25/2022    3:33 PM 09/03/2022   10:52 AM 07/15/2022    3:35 PM  PHQ 2/9 Scores  PHQ - 2 Score 0 0 2 2 0 0 0  PHQ- 9 Score   5 5  2  0    Fall Risk     04/13/2024   12:34 PM 02/24/2024   10:53 AM 04/23/2023    9:05 AM 03/15/2023   10:15 AM 12/25/2022    3:30 PM  Fall Risk   Falls in the past year? 1 0 0 1 1  Number falls in past yr: 1   1 1   Injury with Fall? 0   0 1  Risk for fall due to : Impaired balance/gait;Impaired mobility   History of fall(s) History of fall(s);Impaired balance/gait;Orthopedic patient  Follow up Falls evaluation completed;Education provided   Education provided Education provided;Falls prevention discussed    MEDICARE RISK AT HOME:  Medicare Risk at Home Any stairs in or around the home?: Yes If so, are there any without handrails?: Yes Home free of loose throw rugs in walkways, pet beds, electrical cords, etc?: Yes Adequate lighting in your home to reduce risk of falls?: Yes Life alert?: No Use of a cane, walker or w/c?: No Grab bars in the bathroom?: Yes Shower chair or bench in shower?: Yes Elevated toilet seat or a handicapped toilet?: Yes  TIMED UP AND GO:  Was the  test performed?  no  Cognitive Function: 6CIT completed    04/27/2018   11:30 AM 06/10/2015   12:19 PM  MMSE - Mini Mental State Exam  Orientation to time 5 5   Orientation to Place 5 5   Registration 3 3   Attention/ Calculation 5 5   Recall 2 3   Language- name 2 objects 2 2   Language- repeat 1 1  Language- follow 3 step command 3 3   Language- read & follow direction 1 1   Write a sentence 1 1   Copy design 1 1   Total score 29 30      Data saved with a previous flowsheet row definition        12/25/2022    3:35 PM 12/22/2021   10:10 AM 10/15/2020    3:07 PM 10/13/2019    2:37 PM  6CIT Screen  What Year? 0 points 0 points 0 points 0 points  What month? 0 points 0 points 0 points 0 points  What time? 0 points 0 points 0 points 0 points  Count back from 20 0 points 0 points 0 points 0 points  Months in reverse 0 points 0 points 0 points 0 points  Repeat phrase 0 points 0 points 0 points 0 points  Total Score 0 points 0 points 0 points 0 points    Immunizations Immunization History  Administered Date(s) Administered   Tdap 05/11/2011    Screening Tests Health Maintenance  Topic Date Due   Hepatitis  C Screening  Never done   Pneumococcal Vaccine: 50+ Years (1 of 2 - PCV) Never done   Fecal DNA (Cologuard)  Never done   Zoster Vaccines- Shingrix (1 of 2) Never done   FOOT EXAM  09/26/2022   OPHTHALMOLOGY EXAM  12/24/2023   Influenza Vaccine  Never done   COVID-19 Vaccine (1 - 2024-25 season) Never done   DTaP/Tdap/Td (2 - Td or Tdap) 09/09/2024 (Originally 05/10/2021)   HEMOGLOBIN A1C  08/26/2024   Diabetic kidney evaluation - eGFR measurement  02/23/2025   Diabetic kidney evaluation - Urine ACR  02/24/2025   Medicare Annual Wellness (AWV)  04/13/2025   HPV VACCINES  Aged Out   Meningococcal B Vaccine  Aged Out   Colonoscopy  Discontinued    Health Maintenance Items Addressed: See Nurse Notes at the end of this note  Additional Screening:  Vision  Screening: Recommended annual ophthalmology exams for early detection of glaucoma and other disorders of the eye. Is the patient up to date with their annual eye exam?  Yes  Who is the provider or what is the name of the office in which the patient attends annual eye exams? N/a  Dental Screening: Recommended annual dental exams for proper oral hygiene  Community Resource Referral / Chronic Care Management: CRR required this visit?  No   CCM required this visit?  No   Plan:    I have personally reviewed and noted the following in the patient's chart:   Medical and social history Use of alcohol, tobacco or illicit drugs  Current medications and supplements including opioid prescriptions. Patient is not currently taking opioid prescriptions. Functional ability and status Nutritional status Physical activity Advanced directives List of other physicians Hospitalizations, surgeries, and ER visits in previous 12 months Vitals Screenings to include cognitive, depression, and falls Referrals and appointments  In addition, I have reviewed and discussed with patient certain preventive protocols, quality metrics, and best practice recommendations. A written personalized care plan for preventive services as well as general preventive health recommendations were provided to patient.   Ozie Ned, CMA   04/13/2024   After Visit Summary: (MyChart) Due to this being a telephonic visit, the after visit summary with patients personalized plan was offered to patient via MyChart   Notes: PCP Follow Up Recommendations: Pt Is aware and due the following:cologuard/colonoscopy--pt declined, foot exam, flu,shingles,pnuemonia vaccines--per pt don't do vaccines due he has too many allergies.  I have reviewed and agree with the above AWV documentation.   Mary-Margaret Gladis, FNP

## 2024-04-13 NOTE — Patient Instructions (Signed)
 Seth Martinez,  Thank you for taking the time for your Medicare Wellness Visit. I appreciate your continued commitment to your health goals. Please review the care plan we discussed, and feel free to reach out if I can assist you further.  Medicare recommends these wellness visits once per year to help you and your care team stay ahead of potential health issues. These visits are designed to focus on prevention, allowing your provider to concentrate on managing your acute and chronic conditions during your regular appointments.  Please note that Annual Wellness Visits do not include a physical exam. Some assessments may be limited, especially if the visit was conducted virtually. If needed, we may recommend a separate in-person follow-up with your provider.  Ongoing Care Seeing your primary care provider every 3 to 6 months helps us  monitor your health and provide consistent, personalized care.   Referrals If a referral was made during today's visit and you haven't received any updates within two weeks, please contact the referred provider directly to check on the status.  Recommended Screenings:  Health Maintenance  Topic Date Due   Hepatitis C Screening  Never done   Pneumococcal Vaccine for age over 30 (1 of 2 - PCV) Never done   Cologuard (Stool DNA test)  Never done   Zoster (Shingles) Vaccine (1 of 2) Never done   Complete foot exam   09/26/2022   Eye exam for diabetics  12/24/2023   Medicare Annual Wellness Visit  12/25/2023   Flu Shot  Never done   COVID-19 Vaccine (1 - 2024-25 season) Never done   DTaP/Tdap/Td vaccine (2 - Td or Tdap) 09/09/2024*   Hemoglobin A1C  08/26/2024   Yearly kidney function blood test for diabetes  02/23/2025   Yearly kidney health urinalysis for diabetes  02/24/2025   HPV Vaccine  Aged Out   Meningitis B Vaccine  Aged Out   Colon Cancer Screening  Discontinued  *Topic was postponed. The date shown is not the original due date.       04/13/2024    12:42 PM  Advanced Directives  Does Patient Have a Medical Advance Directive? No   Advance Care Planning is important because it: Ensures you receive medical care that aligns with your values, goals, and preferences. Provides guidance to your family and loved ones, reducing the emotional burden of decision-making during critical moments.  Vision: Annual vision screenings are recommended for early detection of glaucoma, cataracts, and diabetic retinopathy. These exams can also reveal signs of chronic conditions such as diabetes and high blood pressure.  Dental: Annual dental screenings help detect early signs of oral cancer, gum disease, and other conditions linked to overall health, including heart disease and diabetes.  Please see the attached documents for additional preventive care recommendations.

## 2024-04-14 DIAGNOSIS — Z4789 Encounter for other orthopedic aftercare: Secondary | ICD-10-CM | POA: Diagnosis not present

## 2024-08-24 ENCOUNTER — Ambulatory Visit

## 2024-08-24 ENCOUNTER — Ambulatory Visit (INDEPENDENT_AMBULATORY_CARE_PROVIDER_SITE_OTHER): Payer: Self-pay | Admitting: Nurse Practitioner

## 2024-08-24 ENCOUNTER — Encounter: Payer: Self-pay | Admitting: Nurse Practitioner

## 2024-08-24 ENCOUNTER — Other Ambulatory Visit: Payer: Self-pay | Admitting: Nurse Practitioner

## 2024-08-24 VITALS — BP 136/78 | HR 78 | Ht 70.0 in | Wt 303.0 lb

## 2024-08-24 DIAGNOSIS — E8881 Metabolic syndrome: Secondary | ICD-10-CM

## 2024-08-24 DIAGNOSIS — E039 Hypothyroidism, unspecified: Secondary | ICD-10-CM

## 2024-08-24 DIAGNOSIS — I10 Essential (primary) hypertension: Secondary | ICD-10-CM

## 2024-08-24 DIAGNOSIS — M25572 Pain in left ankle and joints of left foot: Secondary | ICD-10-CM

## 2024-08-24 DIAGNOSIS — N401 Enlarged prostate with lower urinary tract symptoms: Secondary | ICD-10-CM | POA: Diagnosis not present

## 2024-08-24 DIAGNOSIS — E782 Mixed hyperlipidemia: Secondary | ICD-10-CM

## 2024-08-24 DIAGNOSIS — R351 Nocturia: Secondary | ICD-10-CM

## 2024-08-24 DIAGNOSIS — Z789 Other specified health status: Secondary | ICD-10-CM

## 2024-08-24 DIAGNOSIS — E559 Vitamin D deficiency, unspecified: Secondary | ICD-10-CM

## 2024-08-24 MED ORDER — HYDROCODONE-ACETAMINOPHEN 5-325 MG PO TABS: 1.0000 | ORAL_TABLET | Freq: Four times a day (QID) | ORAL | 0 refills | Status: AC | PRN

## 2024-08-24 MED ORDER — OXYCODONE-ACETAMINOPHEN 5-325 MG PO TABS: 1.0000 | ORAL_TABLET | Freq: Two times a day (BID) | ORAL | 0 refills | Status: AC | PRN

## 2024-08-24 MED ORDER — LISINOPRIL 40 MG PO TABS: 40.0000 mg | ORAL_TABLET | Freq: Every day | ORAL | 1 refills | Status: AC

## 2024-08-24 MED ORDER — LEVOTHYROXINE SODIUM 50 MCG PO TABS: 50.0000 ug | ORAL_TABLET | Freq: Every day | ORAL | 1 refills | Status: AC

## 2024-08-24 NOTE — Progress Notes (Addendum)
 "  Subjective:    Patient ID: Seth Martinez, male    DOB: Nov 25, 1950, 74 y.o.   MRN: [id number]   Chief Complaint: Medical Management of Chronic Issues, Hypertension, and Hypothyroidism    HPI:  Seth Martinez is a 74 y.o. who identifies as a male who was assigned male at birth.   Social history: Lives with: wife Work history: retired   Water Engineer in today for follow up of the following chronic medical issues:  1. Primary hypertension No c/o chest pain, sob or headache. Does check blood pressure at home. At home running around 140's systolic BP Readings from Last 3 Encounters:  04/13/24 139/81  02/24/24 139/81  09/10/23 (!) 144/90     2. Mixed hyperlipidemia Does not watch diet and does no exercise. Statin intolerance Lab Results  Component Value Date   CHOL 151 02/24/2024   HDL 35 (L) 02/24/2024   LDLCALC 88 02/24/2024   TRIG 159 (H) 02/24/2024   CHOLHDL 4.3 02/24/2024     3. Metabolic syndrome Does not check blood sugar at home Lab Results  Component Value Date   HGBA1C 5.7 (H) 02/24/2024     4. Acquired hypothyroidism No issues that he is aware of. Lab Results  Component Value Date   TSH 2.590 02/24/2024     5. Statin intolerance  6. Vitamin D  deficiency Is on daily vitamin d  supplement  7. Benign prostatic hyperplasia with nocturia No voiding issues  8. Morbid obesity (HCC) No recent weight changes  Wt Readings from Last 3 Encounters:  08/24/24 (!) 303 lb (137.4 kg)  04/13/24 (!) 309 lb (140.2 kg)  02/24/24 (!) 309 lb (140.2 kg)   BMI Readings from Last 3 Encounters:  08/24/24 43.48 kg/m  04/13/24 44.34 kg/m  02/24/24 44.34 kg/m         New complaints:  Patient fell on ice last thursday on some ice and scratched is leg. A few days later his left ankle is sore and bruised. Pain is worse when sitting. Better when standing or walking. Allergies  Allergen Reactions   Morphine And Codeine Anaphylaxis   Penicillins  Anaphylaxis    Has patient had a PCN reaction causing immediate rash, facial/tongue/throat swelling, SOB or lightheadedness with hypotension: Yes Has patient had a PCN reaction causing severe rash involving mucus membranes or skin necrosis: No Has patient had a PCN reaction that required hospitalization No Has patient had a PCN reaction occurring within the last 10 years: No If all of the above answers are NO, then may proceed with Cephalosporin use.   Vancomycin  Rash and Other (See Comments)    Vasculitis, Red Man's Syndrome   Codeine    Amitriptyline Other (See Comments)    Unknown   Antara [Fenofibrate] Other (See Comments)    Unknown   Bextra [Valdecoxib] Other (See Comments)    Unknown   Cefdinir Other (See Comments)    Unknown   Celebrex [Celecoxib] Other (See Comments)    Unknown   Crestor [Rosuvastatin Calcium] Other (See Comments)    Unknown   Escitalopram Oxalate Other (See Comments)    Unknown   Fish Oil Other (See Comments)    Unknown   Ibuprofen Swelling   Naproxen Sodium Other (See Comments)    Unknown   Neurontin [Gabapentin] Other (See Comments)    Unknown   Skelaxin Other (See Comments)    Unknown   Ultram [Tramadol] Other (See Comments)    Unknown   Vioxx [Rofecoxib] Other (See Comments)  Unknown   Zocor [Simvastatin] Other (See Comments)    Unknown   Outpatient Encounter Medications as of 08/24/2024  Medication Sig   Ascorbic Acid (VITA-C PO) Take by mouth.   aspirin 325 MG tablet Take 325 mg by mouth daily.   clotrimazole -betamethasone  (LOTRISONE ) cream Apply 1 application topically daily.   EPINEPHrine  (EPI-PEN) 0.3 mg/0.3 mL SOAJ injection Inject 0.3 mLs (0.3 mg total) into the muscle once.   fluticasone  (FLONASE ) 50 MCG/ACT nasal spray Place 2 sprays into both nostrils daily.   levothyroxine  (SYNTHROID ) 50 MCG tablet Take 1 tablet (50 mcg total) by mouth daily.   lisinopril  (ZESTRIL ) 40 MG tablet Take 1 tablet (40 mg total) by mouth daily.    Pyridoxine HCl (VITAMIN B6 PO) Take by mouth.   No facility-administered encounter medications on file as of 08/24/2024.    Past Surgical History:  Procedure Laterality Date   BACK SURGERY  1992   lumb fusion   ELBOW SURGERY  1965   right-fx   fix screws in sacrum  in office  1993   fusion lt sacrum with screws  1993   HERNIA REPAIR     bilat ing 43 weeks old   I & D EXTREMITY Right 06/23/2013   Procedure: IRRIGATION AND DEBRIDEMENT EXTREMITY;  Surgeon: Elsie Mussel, MD;  Location: [facility];  Service: Orthopedics;  Laterality: Right;   INGUINAL HERNIA REPAIR     bilat age 25   KNEE ARTHROSCOPY Left 05/04/2013   Procedure: ARTHROSCOPY KNEE WITH WASHOUT;  Surgeon: Evalene JONETTA Chancy, MD;  Location: [facility] OR;  Service: Orthopedics;  Laterality: Left;   ORIF CLAVICLE FRACTURE  1956   left-age 9   ORIF FIBULA FRACTURE  1956   lt-accident age 89yr-   ORIF PELVIC FRACTURE  1956   age 40   ORIF RADIUS & ULNA FRACTURES  1956   left-age 9   rt knee arthroscopic  07/2006   TEE WITHOUT CARDIOVERSION N/A 05/04/2013   Procedure: TRANSESOPHAGEAL ECHOCARDIOGRAM (TEE);  Surgeon: Oneil Parchment, MD;  Location: [facility];  Service: Cardiovascular;  Laterality: N/A;   TENDON REPAIR Right 06/23/2013   Procedure: RIGHT INDEX FINGER AND PALM IRRIGATION AND DEBRIDEMENT FLEXOR  TENOSYNOVECTOMY BX AND REPAIR AS NECESSARY ;  Surgeon: Elsie Mussel, MD;  Location: [facility];  Service: Orthopedics;  Laterality: Right;    Family History  Problem Relation Age of Onset   Stroke Father    Diabetes type II Brother       Controlled substance contract: n/a     Review of Systems  Constitutional:  Negative for diaphoresis.  Eyes:  Negative for pain.  Respiratory:  Negative for shortness of breath.   Cardiovascular:  Negative for chest pain, palpitations and leg swelling.  Gastrointestinal:  Negative for abdominal pain.  Endocrine: Negative for polydipsia.  Musculoskeletal:   Positive for back pain.  Skin:  Negative for rash.  Neurological:  Negative for dizziness, weakness and headaches.  Hematological:  Does not bruise/bleed easily.  All other systems reviewed and are negative.      Objective:   Physical Exam Vitals and nursing note reviewed.  Constitutional:      Appearance: Normal appearance. He is well-developed.  HENT:     Head: Normocephalic.     Nose: Nose normal.     Mouth/Throat:     Mouth: Mucous membranes are moist.     Pharynx: Oropharynx is clear.  Eyes:     Pupils: Pupils are equal, round, and  reactive to light.  Neck:     Thyroid : No thyroid  mass or thyromegaly.     Vascular: No carotid bruit or JVD.     Trachea: Phonation normal.  Cardiovascular:     Rate and Rhythm: Normal rate and regular rhythm.  Pulmonary:     Effort: Pulmonary effort is normal. No respiratory distress.     Breath sounds: Normal breath sounds.  Abdominal:     General: Bowel sounds are normal.     Palpations: Abdomen is soft.     Tenderness: There is no abdominal tenderness.  Musculoskeletal:        General: Normal range of motion.     Cervical back: Normal range of motion and neck supple.  Lymphadenopathy:     Cervical: No cervical adenopathy.  Skin:    General: Skin is warm and dry.  Neurological:     Mental Status: He is alert and oriented to person, place, and time.  Psychiatric:        Behavior: Behavior normal.        Thought Content: Thought content normal.        Judgment: Judgment normal.      BP 136/78   Pulse 78   Ht 5' 10 (1.778 m)   Wt (!) 303 lb (137.4 kg)   SpO2 97%   BMI 43.48 kg/m   Left ankle xray- negative- no fracture-Preliminary reading by Ronal Lunger, FNP  [facility]    Hgba1c 5.6%      Assessment & Plan:   Seth Martinez comes in today with chief complaint of Medical Management of Chronic Issues, Hypertension, and Hypothyroidism   Diagnosis and orders addressed:  1. Primary hypertension Changed blood  pressure meds to lisinopril  40 - CBC with Differential/Platelet - CMP14+EGFR - lisinopril  (ZESTRIL ) 40 MG tablet; Take 1 tablet (40 mg total) by mouth daily.  Dispense: 90 tablet; Refill: 3  2. Mixed hyperlipidemia Low fat diet - Lipid panel  3. Metabolic syndrome Continue to watch carbs in diet - Bayer DCA Hb A1c Waived - Microalbumin / creatinine urine ratio  4. Acquired hypothyroidism Labs pending - levothyroxine  (SYNTHROID ) 50 MCG tablet; Take 1 tablet (50 mcg total) by mouth daily.  Dispense: 90 tablet; Refill: 1  5. Statin intolerance Strict low fat diet  6. Vitamin D  deficiency Continue daily vitamin d  supplement  7. Benign prostatic hyperplasia with nocturia Report any voiding issues  8. Morbid obesity (HCC) Discussed diet and exercise for person with BMI >25 Will recheck weight in 3-6 months  Meds ordered this encounter  Medications   HYDROcodone -acetaminophen  (NORCO/VICODIN) 5-325 MG tablet    Sig: Take 1 tablet by mouth every 6 (six) hours as needed for moderate pain (pain score 4-6).    Dispense:  30 tablet    Refill:  0    Supervising Provider:   DETTINGER, JOSHUA A [[id number]]   levothyroxine  (SYNTHROID ) 50 MCG tablet    Sig: Take 1 tablet (50 mcg total) by mouth daily.    Dispense:  90 tablet    Refill:  1    Supervising Provider:   DETTINGER, JOSHUA A [[id number]]   lisinopril  (ZESTRIL ) 40 MG tablet    Sig: Take 1 tablet (40 mg total) by mouth daily.    Dispense:  90 tablet    Refill:  1    Supervising Provider:   MARYANNE CHEW A [id number]    Labs pending Health Maintenance reviewed Diet and exercise encouraged  Follow up plan: 6 months  Mary-Margaret Gladis, FNP  "

## 2024-08-24 NOTE — Patient Instructions (Signed)
 Stretched or Torn Ligament in the Ankle (Ankle Sprain): What to Know  An ankle sprain is a stretch or tear in a ligament in the ankle. Ligaments are tissues that connect bones to each other. The two most common types of ankle sprains are: Inversion sprain. This happens when the foot turns inward and the ankle rolls outward. It affects the ligament on the outside of the foot (lateral ligament). Eversion sprain. This happens when the foot turns outward and the ankle rolls inward. It affects the ligament on the inner side of the foot (medial ligament). What are the causes? An ankle sprain is often caused by rolling or twisting the ankle by accident. What increases the risk? You are more likely to get an ankle sprain if you play sports. What are the signs or symptoms? Symptoms of an ankle sprain include: Pain in your ankle. Swelling. Bruising. Bruises may form right after you sprain your ankle or 1-2 days later. Trouble standing or walking. This includes trouble turning or changing directions. How is this diagnosed? An ankle sprain is diagnosed with a physical exam. Your health care provider will press on parts of your foot and ankle and try to move them in certain ways. You may also have X-rays taken. These may be done to see how severe the sprain is and to check for broken bones. How is this treated? An ankle sprain may be treated with: A brace or splint. This is used to keep the ankle from moving until it heals. An elastic bandage (dressing). This is used to support the ankle. Crutches. Pain medicine. Surgery. This may be needed if the sprain is severe. Physical therapy. This may help to improve the range of motion in the ankle. Follow these instructions at home: If you have a removable brace or a splint: Wear the brace or splint as told by your provider. Remove it only as told by your provider. Check the skin around the brace or splint every day. Tell your provider about any  concerns. Loosen the brace or splint if your toes tingle, become numb, or turn cold and blue. Keep the brace or splint clean. If the brace or splint is not waterproof: Do not let it get wet. Cover it with a watertight covering when you take a bath or a shower. If you have an elastic dressing: Take the dressing off to shower or bathe. If the dressing feels too tight, adjust it to make it more comfortable. Loosen the dressing if your foot tingles, becomes numb, or turns cold and blue. Managing pain, stiffness, and swelling If told, put ice on the affected area. If you have a removable brace or splint, remove it as told by your provider. Put ice in a plastic bag. Place a towel between your skin and the bag. Leave the ice on for 20 minutes, 2-3 times a day. Remove the ice if your skin turns bright red. This is very important. If you cannot feel pain, heat, or cold, you have a greater risk of damage to the area. If your skin turns bright red, remove the ice right away to prevent skin damage. The risk of damage is higher if you cannot feel pain, heat, or cold. Move your toes often to reduce stiffness and swelling. For 2-3 days, raise (elevate) your ankle above the level of your heart while you are sitting or lying down. General instructions Take over-the-counter and prescription medicines only as told by your provider. Do not use any products that contain  nicotine or tobacco. These products include cigarettes, chewing tobacco, and vaping devices, such as e-cigarettes. If you need help quitting, ask your provider. Rest your ankle. Do not use your ankle to support your body weight until your provider says that you can. Use crutches as told by your provider. Ask your provider when it is safe to drive if you have a brace or splint on your ankle. Contact a health care provider if: You have bruising or swelling that get worse all of a sudden. Your pain does not get better with medicine. Get help  right away if: Your foot or toes become numb or blue. You have severe pain that gets worse. This information is not intended to replace advice given to you by your health care provider. Make sure you discuss any questions you have with your health care provider. Document Revised: 05/11/2024 Document Reviewed: 04/08/2022 Elsevier Patient Education  2025 [employer].

## 2024-08-24 NOTE — Addendum Note (Signed)
 Addended by: Maudie Shingledecker, MARY-MARGARET on: 08/24/2024 11:38 AM   Modules accepted: Orders

## 2024-08-25 ENCOUNTER — Ambulatory Visit: Payer: Self-pay | Admitting: Nurse Practitioner

## 2024-08-25 ENCOUNTER — Telehealth: Payer: Self-pay | Admitting: Family Medicine

## 2024-08-25 LAB — CBC WITH DIFFERENTIAL/PLATELET
Basophils Absolute: 0.1 10*3/uL (ref 0.0–0.2)
Basos: 1 %
EOS (ABSOLUTE): 0.2 10*3/uL (ref 0.0–0.4)
Eos: 2 %
Hematocrit: 50.3 % (ref 37.5–51.0)
Hemoglobin: 16.5 g/dL (ref 13.0–17.7)
Immature Grans (Abs): 0 10*3/uL (ref 0.0–0.1)
Immature Granulocytes: 0 %
Lymphocytes Absolute: 1.8 10*3/uL (ref 0.7–3.1)
Lymphs: 21 %
MCH: 30 pg (ref 26.6–33.0)
MCHC: 32.8 g/dL (ref 31.5–35.7)
MCV: 92 fL (ref 79–97)
Monocytes Absolute: 0.7 10*3/uL (ref 0.1–0.9)
Monocytes: 8 %
Neutrophils Absolute: 5.7 10*3/uL (ref 1.4–7.0)
Neutrophils: 68 %
Platelets: 287 10*3/uL (ref 150–450)
RBC: 5.5 x10E6/uL (ref 4.14–5.80)
RDW: 13.1 % (ref 11.6–15.4)
WBC: 8.5 10*3/uL (ref 3.4–10.8)

## 2024-08-25 LAB — LIPID PANEL
Chol/HDL Ratio: 4 ratio (ref 0.0–5.0)
Cholesterol, Total: 175 mg/dL (ref 100–199)
HDL: 44 mg/dL
LDL Chol Calc (NIH): 102 mg/dL — ABNORMAL HIGH (ref 0–99)
Triglycerides: 165 mg/dL — ABNORMAL HIGH (ref 0–149)
VLDL Cholesterol Cal: 29 mg/dL (ref 5–40)

## 2024-08-25 LAB — CMP14+EGFR
ALT: 36 [IU]/L (ref 0–44)
AST: 44 [IU]/L — ABNORMAL HIGH (ref 0–40)
Albumin: 4.7 g/dL (ref 3.8–4.8)
Alkaline Phosphatase: 69 [IU]/L (ref 47–123)
BUN/Creatinine Ratio: 8 — ABNORMAL LOW (ref 10–24)
BUN: 11 mg/dL (ref 8–27)
Bilirubin Total: 0.8 mg/dL (ref 0.0–1.2)
CO2: 20 mmol/L (ref 20–29)
Calcium: 10.1 mg/dL (ref 8.6–10.2)
Chloride: 104 mmol/L (ref 96–106)
Creatinine, Ser: 1.34 mg/dL — ABNORMAL HIGH (ref 0.76–1.27)
Globulin, Total: 2.4 g/dL (ref 1.5–4.5)
Glucose: 101 mg/dL — ABNORMAL HIGH (ref 70–99)
Potassium: 5.3 mmol/L — ABNORMAL HIGH (ref 3.5–5.2)
Sodium: 143 mmol/L (ref 134–144)
Total Protein: 7.1 g/dL (ref 6.0–8.5)
eGFR: 55 mL/min/{1.73_m2} — ABNORMAL LOW

## 2024-08-25 LAB — THYROID PANEL WITH TSH
Free Thyroxine Index: 2.2 (ref 1.2–4.9)
T3 Uptake Ratio: 32 % (ref 24–39)
T4, Total: 6.9 ug/dL (ref 4.5–12.0)
TSH: 2.13 u[IU]/mL (ref 0.450–4.500)

## 2024-08-25 LAB — VITAMIN D 25 HYDROXY (VIT D DEFICIENCY, FRACTURES): Vit D, 25-Hydroxy: 44.9 ng/mL (ref 30.0–100.0)

## 2024-08-25 NOTE — Progress Notes (Signed)
"  Pt r/c   "

## 2024-08-25 NOTE — Telephone Encounter (Signed)
 Copied from CRM [id number]. Topic: Clinical - Lab/Test Results >> Aug 25, 2024 11:47 AM Wess RAMAN wrote: Reason for CRM: Patient is requesting to speak with Leigh Rosina SAILOR, LPN. He missed her call to relay lab results.  Callback #: [id number]

## 2024-08-25 NOTE — Telephone Encounter (Signed)
 Pt made aware of results by Luke. Pt has no further concerns.

## 2025-02-20 ENCOUNTER — Ambulatory Visit: Admitting: Nurse Practitioner
